# Patient Record
Sex: Male | Born: 1965
Health system: Southern US, Community
[De-identification: ages and names within clinical notes are randomized; demographics above are authoritative.]

## PROBLEM LIST (undated history)

## (undated) DIAGNOSIS — I509 Heart failure, unspecified: Secondary | ICD-10-CM

## (undated) DIAGNOSIS — Z8673 Personal history of transient ischemic attack (TIA), and cerebral infarction without residual deficits: Secondary | ICD-10-CM

## (undated) DIAGNOSIS — E119 Type 2 diabetes mellitus without complications: Secondary | ICD-10-CM

## (undated) DIAGNOSIS — I639 Cerebral infarction, unspecified: Secondary | ICD-10-CM

## (undated) DIAGNOSIS — I10 Essential (primary) hypertension: Secondary | ICD-10-CM

## (undated) HISTORY — PX: EXPLORATORY LAPAROTOMY: SUR591

---

## 2017-07-22 ENCOUNTER — Other Ambulatory Visit: Payer: Self-pay

## 2017-07-22 ENCOUNTER — Emergency Department (HOSPITAL_COMMUNITY): Payer: Medicare Other

## 2017-07-22 ENCOUNTER — Inpatient Hospital Stay (HOSPITAL_COMMUNITY)
Admission: EM | Admit: 2017-07-22 | Discharge: 2017-07-25 | DRG: 291 | Disposition: A | Discharge status: Home Health | Payer: Medicare Other | Attending: Internal Medicine | Admitting: Internal Medicine

## 2017-07-22 ENCOUNTER — Encounter (HOSPITAL_COMMUNITY): Payer: Self-pay

## 2017-07-22 DIAGNOSIS — I5023 Acute on chronic systolic (congestive) heart failure: Secondary | ICD-10-CM | POA: Diagnosis present

## 2017-07-22 DIAGNOSIS — N289 Disorder of kidney and ureter, unspecified: Secondary | ICD-10-CM | POA: Diagnosis not present

## 2017-07-22 DIAGNOSIS — I69354 Hemiplegia and hemiparesis following cerebral infarction affecting left non-dominant side: Secondary | ICD-10-CM | POA: Diagnosis not present

## 2017-07-22 DIAGNOSIS — I10 Essential (primary) hypertension: Secondary | ICD-10-CM | POA: Diagnosis present

## 2017-07-22 DIAGNOSIS — Z7982 Long term (current) use of aspirin: Secondary | ICD-10-CM | POA: Diagnosis not present

## 2017-07-22 DIAGNOSIS — R0603 Acute respiratory distress: Secondary | ICD-10-CM | POA: Diagnosis not present

## 2017-07-22 DIAGNOSIS — L84 Corns and callosities: Secondary | ICD-10-CM | POA: Diagnosis present

## 2017-07-22 DIAGNOSIS — Z794 Long term (current) use of insulin: Secondary | ICD-10-CM | POA: Diagnosis not present

## 2017-07-22 DIAGNOSIS — M25076 Hemarthrosis, unspecified foot: Secondary | ICD-10-CM | POA: Diagnosis not present

## 2017-07-22 DIAGNOSIS — I110 Hypertensive heart disease with heart failure: Principal | ICD-10-CM | POA: Diagnosis present

## 2017-07-22 DIAGNOSIS — F17200 Nicotine dependence, unspecified, uncomplicated: Secondary | ICD-10-CM | POA: Diagnosis not present

## 2017-07-22 DIAGNOSIS — R0902 Hypoxemia: Secondary | ICD-10-CM | POA: Diagnosis not present

## 2017-07-22 DIAGNOSIS — J9601 Acute respiratory failure with hypoxia: Secondary | ICD-10-CM | POA: Diagnosis present

## 2017-07-22 DIAGNOSIS — I5020 Unspecified systolic (congestive) heart failure: Secondary | ICD-10-CM

## 2017-07-22 DIAGNOSIS — Z79899 Other long term (current) drug therapy: Secondary | ICD-10-CM

## 2017-07-22 DIAGNOSIS — J810 Acute pulmonary edema: Secondary | ICD-10-CM

## 2017-07-22 DIAGNOSIS — Z8249 Family history of ischemic heart disease and other diseases of the circulatory system: Secondary | ICD-10-CM | POA: Diagnosis not present

## 2017-07-22 DIAGNOSIS — E119 Type 2 diabetes mellitus without complications: Secondary | ICD-10-CM | POA: Diagnosis not present

## 2017-07-22 DIAGNOSIS — E1140 Type 2 diabetes mellitus with diabetic neuropathy, unspecified: Secondary | ICD-10-CM | POA: Diagnosis present

## 2017-07-22 DIAGNOSIS — R29818 Other symptoms and signs involving the nervous system: Secondary | ICD-10-CM | POA: Diagnosis not present

## 2017-07-22 DIAGNOSIS — R0602 Shortness of breath: Secondary | ICD-10-CM

## 2017-07-22 DIAGNOSIS — K30 Functional dyspepsia: Secondary | ICD-10-CM | POA: Diagnosis present

## 2017-07-22 DIAGNOSIS — E1165 Type 2 diabetes mellitus with hyperglycemia: Secondary | ICD-10-CM | POA: Diagnosis present

## 2017-07-22 DIAGNOSIS — I161 Hypertensive emergency: Secondary | ICD-10-CM | POA: Diagnosis present

## 2017-07-22 DIAGNOSIS — N179 Acute kidney failure, unspecified: Secondary | ICD-10-CM

## 2017-07-22 DIAGNOSIS — R739 Hyperglycemia, unspecified: Secondary | ICD-10-CM

## 2017-07-22 DIAGNOSIS — F17210 Nicotine dependence, cigarettes, uncomplicated: Secondary | ICD-10-CM | POA: Diagnosis present

## 2017-07-22 DIAGNOSIS — I5030 Unspecified diastolic (congestive) heart failure: Secondary | ICD-10-CM | POA: Diagnosis not present

## 2017-07-22 DIAGNOSIS — Z91018 Allergy to other foods: Secondary | ICD-10-CM | POA: Diagnosis not present

## 2017-07-22 DIAGNOSIS — Z832 Family history of diseases of the blood and blood-forming organs and certain disorders involving the immune mechanism: Secondary | ICD-10-CM | POA: Diagnosis not present

## 2017-07-22 DIAGNOSIS — Z8673 Personal history of transient ischemic attack (TIA), and cerebral infarction without residual deficits: Secondary | ICD-10-CM

## 2017-07-22 DIAGNOSIS — R52 Pain, unspecified: Secondary | ICD-10-CM | POA: Diagnosis not present

## 2017-07-22 HISTORY — DX: Heart failure, unspecified: I50.9

## 2017-07-22 HISTORY — DX: Cerebral infarction, unspecified: I63.9

## 2017-07-22 HISTORY — DX: Type 2 diabetes mellitus without complications: E11.9

## 2017-07-22 HISTORY — DX: Personal history of transient ischemic attack (TIA), and cerebral infarction without residual deficits: Z86.73

## 2017-07-22 HISTORY — DX: Essential (primary) hypertension: I10

## 2017-07-22 LAB — COMPREHENSIVE METABOLIC PANEL
ALT: 11 U/L — AB (ref 17–63)
AST: 16 U/L (ref 15–41)
Albumin: 2 g/dL — ABNORMAL LOW (ref 3.5–5.0)
Alkaline Phosphatase: 77 U/L (ref 38–126)
Anion gap: 10 (ref 5–15)
BUN: 25 mg/dL — AB (ref 6–20)
CALCIUM: 8.2 mg/dL — AB (ref 8.9–10.3)
CO2: 25 mmol/L (ref 22–32)
CREATININE: 1.94 mg/dL — AB (ref 0.61–1.24)
Chloride: 103 mmol/L (ref 101–111)
GFR, EST AFRICAN AMERICAN: 44 mL/min — AB (ref >60)
GFR, EST NON AFRICAN AMERICAN: 38 mL/min — AB (ref >60)
Glucose, Bld: 229 mg/dL — ABNORMAL HIGH (ref 65–99)
Potassium: 3.8 mmol/L (ref 3.5–5.1)
SODIUM: 138 mmol/L (ref 135–145)
Total Bilirubin: 0.5 mg/dL (ref 0.3–1.2)
Total Protein: 6 g/dL — ABNORMAL LOW (ref 6.5–8.1)

## 2017-07-22 LAB — I-STAT ARTERIAL BLOOD GAS, ED
ACID-BASE DEFICIT: 5 mmol/L — AB (ref 0.0–2.0)
BICARBONATE: 21.3 mmol/L (ref 20.0–28.0)
O2 Saturation: 95 %
TCO2: 23 mmol/L (ref 22–32)
pCO2 arterial: 45.2 mmHg (ref 32.0–48.0)
pH, Arterial: 7.282 — ABNORMAL LOW (ref 7.350–7.450)
pO2, Arterial: 88 mmHg (ref 83.0–108.0)

## 2017-07-22 LAB — MAGNESIUM: Magnesium: 1.8 mg/dL (ref 1.7–2.4)

## 2017-07-22 LAB — CBC WITH DIFFERENTIAL/PLATELET
Basophils Absolute: 0 10*3/uL (ref 0.0–0.1)
Basophils Relative: 0 %
EOS ABS: 0.1 10*3/uL (ref 0.0–0.7)
EOS PCT: 1 %
HCT: 35.7 % — ABNORMAL LOW (ref 39.0–52.0)
Hemoglobin: 11.1 g/dL — ABNORMAL LOW (ref 13.0–17.0)
LYMPHS ABS: 6 10*3/uL — AB (ref 0.7–4.0)
Lymphocytes Relative: 45 %
MCH: 26.7 pg (ref 26.0–34.0)
MCHC: 31.1 g/dL (ref 30.0–36.0)
MCV: 86 fL (ref 78.0–100.0)
MONO ABS: 0.7 10*3/uL (ref 0.1–1.0)
Monocytes Relative: 5 %
NEUTROS PCT: 49 %
Neutro Abs: 6.5 10*3/uL (ref 1.7–7.7)
PLATELETS: 381 10*3/uL (ref 150–400)
RBC: 4.15 MIL/uL — AB (ref 4.22–5.81)
RDW: 15.7 % — AB (ref 11.5–15.5)
WBC: 13.3 10*3/uL — AB (ref 4.0–10.5)

## 2017-07-22 LAB — I-STAT CHEM 8, ED
BUN: 26 mg/dL — ABNORMAL HIGH (ref 6–20)
CREATININE: 2.1 mg/dL — AB (ref 0.61–1.24)
Calcium, Ion: 1.08 mmol/L — ABNORMAL LOW (ref 1.15–1.40)
Chloride: 104 mmol/L (ref 101–111)
GLUCOSE: 367 mg/dL — AB (ref 65–99)
HEMATOCRIT: 36 % — AB (ref 39.0–52.0)
HEMOGLOBIN: 12.2 g/dL — AB (ref 13.0–17.0)
Potassium: 3.2 mmol/L — ABNORMAL LOW (ref 3.5–5.1)
Sodium: 139 mmol/L (ref 135–145)
TCO2: 23 mmol/L (ref 22–32)

## 2017-07-22 LAB — GLUCOSE, CAPILLARY
Glucose-Capillary: 212 mg/dL — ABNORMAL HIGH (ref 65–99)
Glucose-Capillary: 168 mg/dL — ABNORMAL HIGH (ref 65–99)

## 2017-07-22 LAB — TROPONIN I
TROPONIN I: 0.64 ng/mL — AB (ref <0.03)
Troponin I: 0.74 ng/mL (ref <0.03)

## 2017-07-22 LAB — PROTIME-INR
INR: 1.01
Prothrombin Time: 13.2 seconds (ref 11.4–15.2)

## 2017-07-22 LAB — HEMOGLOBIN A1C
Hgb A1c MFr Bld: 9 % — ABNORMAL HIGH (ref 4.8–5.6)
MEAN PLASMA GLUCOSE: 211.6 mg/dL

## 2017-07-22 LAB — I-STAT TROPONIN, ED: Troponin i, poc: 0.05 ng/mL (ref 0.00–0.08)

## 2017-07-22 LAB — ETHANOL

## 2017-07-22 LAB — BRAIN NATRIURETIC PEPTIDE: B NATRIURETIC PEPTIDE 5: 1614.4 pg/mL — AB (ref 0.0–100.0)

## 2017-07-22 MED ORDER — ATORVASTATIN CALCIUM 40 MG PO TABS
40.0000 mg | ORAL_TABLET | Freq: Every day | ORAL | Status: DC
  Administered 2017-07-22 – 2017-07-24 (×3): 40 mg via ORAL
  Filled 2017-07-22 (×5): qty 1

## 2017-07-22 MED ORDER — NICOTINE 14 MG/24HR TD PT24
14.0000 mg | MEDICATED_PATCH | Freq: Every day | TRANSDERMAL | Status: DC
  Administered 2017-07-22 – 2017-07-25 (×4): 14 mg via TRANSDERMAL
  Filled 2017-07-22 (×4): qty 1

## 2017-07-22 MED ORDER — POTASSIUM CHLORIDE CRYS ER 20 MEQ PO TBCR
20.0000 meq | EXTENDED_RELEASE_TABLET | ORAL | Status: AC
  Administered 2017-07-22 (×2): 20 meq via ORAL
  Filled 2017-07-22 (×2): qty 1

## 2017-07-22 MED ORDER — INSULIN ASPART 100 UNIT/ML ~~LOC~~ SOLN
0.0000 [IU] | Freq: Three times a day (TID) | SUBCUTANEOUS | Status: DC
  Administered 2017-07-22: 5 [IU] via SUBCUTANEOUS
  Administered 2017-07-23: 2 [IU] via SUBCUTANEOUS
  Administered 2017-07-23 – 2017-07-24 (×3): 3 [IU] via SUBCUTANEOUS
  Administered 2017-07-24: 2 [IU] via SUBCUTANEOUS
  Administered 2017-07-25: 3 [IU] via SUBCUTANEOUS

## 2017-07-22 MED ORDER — INSULIN ASPART 100 UNIT/ML ~~LOC~~ SOLN
0.0000 [IU] | Freq: Every day | SUBCUTANEOUS | Status: DC
  Administered 2017-07-23: 2 [IU] via SUBCUTANEOUS

## 2017-07-22 MED ORDER — GABAPENTIN 300 MG PO CAPS
300.0000 mg | ORAL_CAPSULE | Freq: Every day | ORAL | Status: DC
  Administered 2017-07-22 – 2017-07-24 (×3): 300 mg via ORAL
  Filled 2017-07-22 (×3): qty 1

## 2017-07-22 MED ORDER — ACETAMINOPHEN 325 MG PO TABS
650.0000 mg | ORAL_TABLET | Freq: Four times a day (QID) | ORAL | Status: DC | PRN
  Administered 2017-07-22: 650 mg via ORAL
  Filled 2017-07-22: qty 2

## 2017-07-22 MED ORDER — ONDANSETRON HCL 4 MG PO TABS: 4.0000 mg | ORAL_TABLET | Freq: Four times a day (QID) | ORAL | Status: DC | PRN

## 2017-07-22 MED ORDER — POLYETHYLENE GLYCOL 3350 17 G PO PACK: 17.0000 g | PACK | Freq: Every day | ORAL | Status: DC | PRN

## 2017-07-22 MED ORDER — NITROGLYCERIN IN D5W 200-5 MCG/ML-% IV SOLN
0.0000 ug/min | Freq: Once | INTRAVENOUS | Status: AC
  Administered 2017-07-22: 5 ug/min via INTRAVENOUS
  Filled 2017-07-22: qty 250

## 2017-07-22 MED ORDER — SODIUM CHLORIDE 0.9% FLUSH
3.0000 mL | Freq: Two times a day (BID) | INTRAVENOUS | Status: DC
  Administered 2017-07-22 – 2017-07-25 (×5): 3 mL via INTRAVENOUS

## 2017-07-22 MED ORDER — ASPIRIN EC 81 MG PO TBEC
81.0000 mg | DELAYED_RELEASE_TABLET | Freq: Every day | ORAL | Status: DC
  Administered 2017-07-22 – 2017-07-23 (×2): 81 mg via ORAL
  Filled 2017-07-22 (×2): qty 1

## 2017-07-22 MED ORDER — ACETAMINOPHEN 650 MG RE SUPP: 650.0000 mg | Freq: Four times a day (QID) | RECTAL | Status: DC | PRN

## 2017-07-22 MED ORDER — INSULIN GLARGINE 100 UNIT/ML ~~LOC~~ SOLN
20.0000 [IU] | Freq: Every day | SUBCUTANEOUS | Status: DC
  Administered 2017-07-22: 20 [IU] via SUBCUTANEOUS
  Filled 2017-07-22: qty 0.2

## 2017-07-22 MED ORDER — AMLODIPINE BESYLATE 5 MG PO TABS
5.0000 mg | ORAL_TABLET | Freq: Every day | ORAL | Status: DC
  Administered 2017-07-22 – 2017-07-23 (×2): 5 mg via ORAL
  Filled 2017-07-22 (×2): qty 1

## 2017-07-22 MED ORDER — FUROSEMIDE 10 MG/ML IJ SOLN
40.0000 mg | Freq: Every day | INTRAMUSCULAR | Status: DC
  Administered 2017-07-22 – 2017-07-23 (×2): 40 mg via INTRAVENOUS
  Filled 2017-07-22 (×2): qty 4

## 2017-07-22 MED ORDER — ONDANSETRON HCL 4 MG/2ML IJ SOLN: 4.0000 mg | Freq: Four times a day (QID) | INTRAMUSCULAR | Status: DC | PRN

## 2017-07-22 MED ORDER — ENOXAPARIN SODIUM 40 MG/0.4ML ~~LOC~~ SOLN
40.0000 mg | SUBCUTANEOUS | Status: DC
  Administered 2017-07-22 – 2017-07-24 (×3): 40 mg via SUBCUTANEOUS
  Filled 2017-07-22 (×3): qty 0.4

## 2017-07-22 MED ORDER — ACETAMINOPHEN 500 MG PO TABS
1000.0000 mg | ORAL_TABLET | Freq: Once | ORAL | Status: DC
  Filled 2017-07-22: qty 2

## 2017-07-22 NOTE — Progress Notes (Signed)
CRITICAL VALUE ALERT  Critical Value:  Troponin 0.64  Date & Time Notied:  07/22/17 and 8:25 pm  Provider Notified: On call Internal Medicine Resident  Orders Received/Actions taken: No new orders, will continue to monitor.

## 2017-07-22 NOTE — ED Triage Notes (Signed)
Pt to ED via [facility] from home with c/o sudden onset of shortness of breath-- on arrival pt is responsive to speech, has c-pap on per ems on arrival, changed over to BiPap per resp therapy.

## 2017-07-22 NOTE — Progress Notes (Signed)
Pt taken off bipap and placed on 3L [place] at this time. Pt denies SOB, no increased WOB. VS within normal limits. Pt C/O headache at this time. RN aware

## 2017-07-22 NOTE — H&P (Signed)
Date: 07/22/2017               Patient Name:  Tommy Mclaughlin MRN: [id number]  DOB: 08-24-1965 Age / Sex: 52 y.o., male   PCP: Patient, No Pcp Per         Medical Service: Internal Medicine Teaching Service         Attending Physician: Tommy Mclaughlin, Tommy Cons, MD    First Contact: Tommy Mclaughlin MSIV Pager: [phone]  Second Contact: Dr. Lorella Nimrod MD Pager: [phone]       After Hours (After 5p/  First Contact Pager: [phone]  weekends / holidays): Second Contact Pager: [phone]   Chief Complaint: shortness of breath  History of Present Illness: Tommy Mclaughlin is a 69 yoM with a  PMHx of HTN, T2DM, and s/p CVA in 2017 with residual deficit who presents with respiratory distress, crackles and pulmonary edema. In the emergency department, he required BiPAP and was reportedly lethargic on arrival.   He states that he was in his usual state of health until last night when he began experiencing a hacking cough. The cough continued through to this morning at which time he experienced a bout of post-tussive emesis. This morning it was accompanied by what he refers to as "funny" breathing which he elaborated on to mean shortness of breath. It was at this time that he called his cousin who was able to call 911. He reports an episode of chest pain and palpitations this morning when he had his shortness of breath but struggled to describe it. His only complaint at the time of our exam was a headache.  He also experiences pedal edema which he believes has been worsening recently up until his time of presentation. He feels that he has had increased DOE and now has some shortness of breath while completing ADLs, including getting dressed in the morning. Of note, he has been experiencing frequent "indigestion" after every meal for which he takes Zantac. He states that this helps for awhile until it returns after his next meal.  Tommy Mclaughlin is new to the area from [place] as of last Sunday 4/28. He was  there taking care of his mother, who recently passed away. He states that his usual diabetes regimen is Lantus pens, which he prefers due to ease of administration given residual dominant (left) sided weakness. He states that he did bring his insulin with him, but at the time his cousin lacked a refrigerator so the medicine stayed out for two days. Following this, he was unsure if he should use the pens. The only other drug he has tried for his DM is metformin, which gave him GI upset. He states that he makes no effort to lower his blood sugars through diet, and his stroke history makes it hard for him to exercise, though he has begun lifting weights. He previously took a "blue pill" for his high blood pressure which he believes may have been lisinopril, though he has not taken it for about a year. He cites a drug recall as his reason for discontinuing.  He has some residual left sided weakness from his stroke, which is unfortunately his dominant side. He states that he has difficulty writing as well as some gait changes which require him to use a cane or a walker. He also reports intermittent L hip pain which has persisted since the stroke. He does not have any aphasia. He is aware that the stroke was caused by high blood  pressure and now seems to understand the importance of taking his medication regularly.  Meds:  No outpatient medications have been marked as taking for the 07/22/17 encounter Tommy Mclaughlin Encounter).    Allergies: Allergies as of 07/22/2017  . (No Known Allergies)   Past Medical History:  Diagnosis Date  . Hypertension   . Status post CVA   . T2DM (type 2 diabetes mellitus) ([place])     Family History:  Family History  Problem Relation Age of Onset  . COPD Mother   . Hypertension Mother   . Diabetes Mother   . Diabetes Sister   . Diabetes Brother      Social History:  Social History   Tobacco Use  . Smoking status: Current Every Day Smoker    Packs/day: 0.50    Types:  Cigarettes  . Tobacco comment: Quit smoking with patches while in [place], started again when moved to [facility]  Substance Use Topics  . Alcohol use: Not Currently    Comment: stopped after CVA 2017  . Drug use: Not Currently   Social History   Social History Narrative   Mr. Cork moved to [facility] 07/14/17 from [place] where he resided while taking care of his mother. He currently lives with his cousin, who is a Administrator. He has one adult daughter (age 4).    Review of Systems: A complete ROS was negative except as per HPI.  Physical Exam: Blood pressure (!) 178/117, pulse (!) 111, temperature (!) 97.1 F (36.2 C), resp. rate 20, height 6' (1.829 m), weight 200 lb (90.7 kg), SpO2 97 %.   Physical Exam  Constitutional: He is oriented to person, place, and time. He appears well-developed and well-nourished.  HENT:  Head: Normocephalic and atraumatic.  Cardiovascular: Regular rhythm and intact distal pulses. Tachycardia present. Exam reveals gallop, S3 and decreased pulses.  Pulses:      Dorsalis pedis pulses are 1+ on the right side, and 1+ on the left side.       Posterior tibial pulses are 1+ on the right side, and 1+ on the left side.  Pulmonary/Chest: He has decreased breath sounds. He has rales.  Abdominal: Soft. Bowel sounds are normal.  Abdominal scars x2  Musculoskeletal:       Right lower leg: He exhibits edema.       Left lower leg: He exhibits edema.  Bilateral pedal edema.  Feet:  Right Foot:  Protective Sensation: 3 sites tested. 0 sites sensed.  Skin Integrity: Positive for callus and dry skin.  Left Foot:  Protective Sensation: 3 sites tested. 0 sites sensed.  Skin Integrity: Positive for ulcer, skin breakdown (actively bleeding laceration between 4th/5th toe, unknown to patient) and callus.  Neurological: He is alert and oriented to person, place, and time. A sensory deficit (Lacks sensation to touch below the ankles bilaterally) is present.  Decreased  grip strength on the left.  Skin: Skin is dry. Abrasion (to the feet bilaterally) noted. No cyanosis.  Onychomycosis bilaterally to the great toes.  Psychiatric: He has a normal mood and affect. His behavior is normal.  Vitals reviewed.  Lab Results  Component Value Date   WBC 13.3 (H) 07/22/2017   HGB 12.2 (L) 07/22/2017   HCT 36.0 (L) 07/22/2017   PLT 381 07/22/2017   GLUCOSE 367 (H) 07/22/2017   NA 139 07/22/2017   K 3.2 (L) 07/22/2017   CL 104 07/22/2017   CREATININE 2.10 (H) 07/22/2017   BUN 26 (H) 07/22/2017  INR 1.01 07/22/2017   Troponins 0.05 on arrival.  EKG: personally reviewed my interpretation is sinus tachycardia with occasional PVCs, signs of left ventricular hypertrophy. ST depression in  III and aVF. No prior studies for comparison.  CXR: personally reviewed my interpretation is bilateral infiltrates representative of pulmonary edema R>L. No prior studies for comparison.  CT Head: IMPRESSION: 1. No definite acute infarction or area hemorrhage. 2. Cannot exclude a possible area of small vessel disease in the right periventricular white matter of questionable significance  Assessment & Plan by Problem: Active Problems:   Hypertensive emergency   T2DM (type 2 diabetes mellitus) ([place])   Status post CVA  #Hypertension, Volume Overload: Mr. Gardella has a known history of hypertension which has been untreated for approximately the last year. Highest recorded BP was by EMS (188/124) though his pressures remain elevated. Signs of end organ damage with recurrent H/A. Currently he is requiring 3L O2 by nasal cannula. EKG without evidence of acute infarct and negative troponins, lowering our concern for ischemia. Likely new onset CHF with exacerbation by increased blood pressures, other etiologies for his SOB include COPD or pulmonary hypertension which are less likely given his clinical picture. He is diuretic-naive so we will expect a robust response from diuresis.  -  discontinue nitroglycerin gtt - new O2 requirement 3L, continue to wean as able - IV Lasix 40 mg - amlodipine 40mg  qd - Echocardiogram to be performed - ASA 81 mg, nitroglycerin prn - Tylenol prn for pain  #s/p CVA: Mr. Kneeland has residual left sided weakness from his stroke in LUE and LLE, requires mechanical assistance with ambulation at baseline. No residual speech/swallow effects. - Not on aspirin or statin. - cane/walker for ambulation - PT/OT to see  #DM: Blood glucose 324, patient self-discontinued prescribed Lantus. Creatinine 2.10, baseline unknown, CTM. On exam, feet were poorly groomed with actively bleeding lacerations that patient was unable to feel. Greatly diminished touch sensation. It will be vital for patient to establish care with podiatrist to avoid infection. - Diabetic foot care - Gabapentin 300 mg qd - Blood glucose checks q4h - SSI  #Smoking Cessation: Patient had previously quit smoking for one year with use of patches and just restarted one week ago upon moving to [place]. He seems motivated to quit once more. - Nicotine patch 14 mg - Cessation counseling  PPX: lovenox, famotidine  Dispo: Admit patient to Inpatient with expected length of stay greater than 2 midnights.  Signed: Laureen Ochs, Medical Student 07/22/2017, 5:09 PM  Pager: [phone].  Attestation for Student Documentation:  I personally was present and performed or re-performed the history, physical exam and medical decision-making activities of this service and have verified that the service and findings are accurately documented in the student's note.  Lorella Nimrod, MD 07/22/2017, 7:48 PM

## 2017-07-22 NOTE — Progress Notes (Signed)
Pt brought in by EMS on cpap. Pt placed on [place] bipap with settings initially started at 18/8 R 10 50% due to fluid overload. Pt unable to speak at this time and nods to answer questions. Rhonchi/Rales/Coarse Crackles throughout. ABG obtained RRx1, Results hand delivered to MD at bedside

## 2017-07-22 NOTE — ED Provider Notes (Signed)
[facility] EMERGENCY DEPARTMENT Provider Note  CSN: [id number] Arrival date & time: 07/22/17 8527  Chief Complaint(s) Shortness of Breath  HPI Tommy Mclaughlin is a 52 y.o. male with a history of hypertension, IDDM, prior CVA with left-sided deficits presents to the emergency department in respiratory distress.  EMS was called out due to unknown issue from their report.  Apparently the patient called family member who went by to check on the patient and was able to get into the apartment.  The family member was not around when EMS arrived.  When they were able to get into the apartment and assessed the patient they noted that he was in respiratory distress with diffuse crackles, concerning for pulmonary edema.  Patient was placed on BiPAP.  He was noted to be hypertensive.  In route the patient's mental status declined per EMS.  On arrival here the patient is lethargic but responding and answering questions appropriately.  He reports that he has had shortness of breath since yesterday but has been gradually worsening since onset.  He endorses associated chest pain that began this morning.  Unable to assess qualities of chest pain due to patient's lethargy.  He endorses one episode of emesis.  States that he has had lower extremity edema for "a while," which has gradually been worsening.  States that he recently moved from [place] and has not been able to establish care with any primary care provider here yet.  Denies any recent fevers or infections.  No abdominal pain.   HPI  Past Medical History No past medical history on file. There are no active problems to display for this patient.  Home Medication(s) Prior to Admission medications   Not on File                                                                                                                                    Past Surgical History The histories are not reviewed yet. Please review them in the "History"  navigator section and refresh this [place]. Family History No family history on file.  Social History Social History   Tobacco Use  . Smoking status: Not on file  Substance Use Topics  . Alcohol use: Not on file  . Drug use: Not on file   Allergies Patient has no allergy information on record.  Review of Systems Review of Systems All other systems are reviewed and are negative for acute change except as noted in the HPI  Physical Exam Vital Signs  I have reviewed the triage vital signs BP 122/76 (BP Location: Left Arm)   Pulse (!) 111   Temp (!) 97.1 F (36.2 C) (Temporal)   Resp (!) 42   Ht 6' (1.829 m)   Wt 90.7 kg (200 lb)   SpO2 93%   BMI 27.12 kg/m   Physical Exam  Constitutional: He is oriented to person, place, and time. He appears  well-developed and well-nourished. He appears lethargic. He is sleeping and cooperative. He is easily aroused. He has a sickly appearance. No distress.  HENT:  Head: Normocephalic and atraumatic.  Nose: Nose normal.  Eyes: Pupils are equal, round, and reactive to light. Conjunctivae and EOM are normal. Right eye exhibits no discharge. Left eye exhibits no discharge. No scleral icterus.  Neck: Normal range of motion. Neck supple.  Cardiovascular: Normal rate and regular rhythm. Exam reveals no gallop and no friction rub.  No murmur heard. Pulmonary/Chest: Effort normal. No stridor. No respiratory distress. He has rales (diffuse).  Abdominal: Soft. He exhibits no distension. There is no tenderness.  Musculoskeletal: He exhibits no edema or tenderness.  2+ BLE edema to midcalf  Neurological: He is oriented to person, place, and time and easily aroused. He appears lethargic.  Following commands. Noted to have right sided weakness to UE and LE.  Sensation intact.  Skin: Skin is warm and dry. No rash noted. He is not diaphoretic. No erythema.  Psychiatric: He has a normal mood and affect.  Vitals reviewed.   ED Results and  Treatments Labs (all labs ordered are listed, but only abnormal results are displayed) Labs Reviewed  CBC WITH DIFFERENTIAL/PLATELET - Abnormal; Notable for the following components:      Result Value   WBC 13.3 (*)    RBC 4.15 (*)    Hemoglobin 11.1 (*)    HCT 35.7 (*)    RDW 15.7 (*)    Lymphs Abs 6.0 (*)    All other components within normal limits  BRAIN NATRIURETIC PEPTIDE - Abnormal; Notable for the following components:   B Natriuretic Peptide 1,614.4 (*)    All other components within normal limits  I-STAT CHEM 8, ED - Abnormal; Notable for the following components:   Potassium 3.2 (*)    BUN 26 (*)    Creatinine, Ser 2.10 (*)    Glucose, Bld 367 (*)    Calcium, Ion 1.08 (*)    Hemoglobin 12.2 (*)    HCT 36.0 (*)    All other components within normal limits  I-STAT ARTERIAL BLOOD GAS, ED - Abnormal; Notable for the following components:   pH, Arterial 7.282 (*)    Acid-base deficit 5.0 (*)    All other components within normal limits  PROTIME-INR  BLOOD GAS, ARTERIAL  CBC WITH DIFFERENTIAL/PLATELET  ETHANOL  RAPID URINE DRUG SCREEN, HOSP PERFORMED  URINALYSIS, ROUTINE W REFLEX MICROSCOPIC  I-STAT TROPONIN, ED                                                                                                                         EKG  EKG Interpretation  Date/Time:  Monday Jul 22 2017 09:42:41 EDT Ventricular Rate:  113 PR Interval:    QRS Duration: 84 QT Interval:  302 QTC Calculation: 414 R Axis:   68 Text Interpretation:  Sinus tachycardia Ventricular premature complex Probable left atrial enlargement Anteroseptal infarct, old Nonspecific repol abnormality,  diffuse leads NO STEMI No old tracing to compare Confirmed by Addison Lank [phone]) on 07/22/2017 10:50:57 AM      Radiology Ct Head Wo Contrast  Result Date: 07/22/2017 CLINICAL DATA:  Focal neurological deficit of greater than 6 hours EXAM: CT HEAD WITHOUT CONTRAST TECHNIQUE: Contiguous axial images  were obtained from the base of the skull through the vertex without intravenous contrast. COMPARISON:  None. FINDINGS: Brain: The ventricular system is normal in size and configuration and the septum is midline in position. The fourth ventricle and basilar cisterns are unremarkable. Only questionable abnormality is a subtle small area of low-attenuation in the right periventricular white matter which could represent small vessel ischemic change. No hemorrhage is seen. No mass-effect is noted and there is no evidence of acute infarction. Vascular: No vascular abnormality is seen on this unenhanced study. Skull: On bone window images, no calvarial abnormality is noted. Sinuses/Orbits: Paranasal sinuses are well pneumatized. Other: None. IMPRESSION: 1. No definite acute infarction or area hemorrhage. 2. Cannot exclude a possible area of small vessel disease in the right periventricular white matter of questionable significance. Electronically Signed   By: Ivar Drape M.D.   On: 07/22/2017 12:02   Dg Chest Port 1 View  Result Date: 07/22/2017 CLINICAL DATA:  Shortness of breath, on BiPAP EXAM: PORTABLE CHEST 1 VIEW COMPARISON:  Portable exam 2144 hours without priors for comparison FINDINGS: Normal heart size, mediastinal contours and pulmonary vascularity. Perihilar infiltrates bilaterally which could represent pulmonary edema, developing ARDS or infection. No pleural effusion or pneumothorax. Bones unremarkable. IMPRESSION: BILATERAL perihilar infiltrates which could represent pulmonary edema, developing ARDS or infection. Electronically Signed   By: Lavonia Dana M.D.   On: 07/22/2017 10:09   Pertinent labs & imaging results that were available during my care of the patient were reviewed by me and considered in my medical decision making (see chart for details).  Medications Ordered in ED Medications  nitroGLYCERIN 50 mg in dextrose 5 % 250 mL (0.2 mg/mL) infusion (has no administration in time range)    acetaminophen (TYLENOL) tablet 1,000 mg (has no administration in time range)                                                                                                                                    Procedures Procedures CRITICAL CARE Performed by: Grayce Sessions Demarea Lorey Whiters Total critical care time: 65 minutes Critical care time was exclusive of separately billable procedures and treating other patients. Critical care was necessary to treat or prevent imminent or life-threatening deterioration. Critical care was time spent personally by me on the following activities: development of treatment plan with patient and/or surrogate as well as nursing, discussions with consultants, evaluation of patient's response to treatment, examination of patient, obtaining history from patient or surrogate, ordering and performing treatments and interventions, ordering and review of laboratory studies, ordering and review of radiographic studies, pulse oximetry and re-evaluation of  patient's condition.   (including critical care time)  Medical Decision Making / ED Course I have reviewed the nursing notes for this encounter and the patient's prior records (if available in EHR or on provided paperwork).    Respiratory distress in the setting of pulmonary edema.  Given evidence of volume overload concerning for heart failure versus hypertensive emergency .  Patient denies any prior cardiac disease or heart failure.  Denies any recent fevers or infections that would be suspicious for pneumonia.  Patient was admittedly placed on BiPAP.  Patient's mental status noted to gradually improve during BiPAP treatment.  Chest x-ray obtained revealed evidence of diffuse pulmonary edema.  EKG with evidence of LVH but not suspicious for ST segment elevation MI.  Initial troponin negative.  Patient was noted to have right-sided deficits on exam.  Upon further questioning patient adamantly reports left-sided deficits from  prior CVA.  CT head was obtained to evaluate for possible ICH in the setting of likely hypertensive emergency.  CTA was negative for ICH.  Patient's weakness significantly improved with improved respiratory status.  We will able to wean the patient off of the BiPAP and he was placed on 3 L nasal cannula.  Initially the patient's blood pressure was normotensive while on BiPAP and after getting 1 dose of nitroglycerin by EMS.  After BiPAP was weaned, we noted that the patient's blood pressure became elevated.  As revealed evidence of kidney disease.  Uncertain of chronicity.  Patient placed on nitroglycerin drip.  Patient started complaining of headache and was given Tylenol.   We also noted the patient was hyperglycemic without evidence of DKA.   We will discussed the case with medicine for admission for further work-up and management regarding hypertensive emergency and heart failure evaluation.  Final Clinical Impression(s) / ED Diagnoses Final diagnoses:  SOB (shortness of breath)  Hypertensive emergency  Respiratory distress  Acute pulmonary edema (HCC)  Renal insufficiency  Hyperglycemia      This chart was dictated using voice recognition software.  Despite best efforts to proofread,  errors can occur which can change the documentation meaning.   Fatima Blank, MD 07/22/17 [phone]

## 2017-07-23 ENCOUNTER — Inpatient Hospital Stay (HOSPITAL_COMMUNITY): Payer: Medicare Other

## 2017-07-23 DIAGNOSIS — I5030 Unspecified diastolic (congestive) heart failure: Secondary | ICD-10-CM

## 2017-07-23 LAB — CBC
HCT: 31.5 % — ABNORMAL LOW (ref 39.0–52.0)
Hemoglobin: 9.9 g/dL — ABNORMAL LOW (ref 13.0–17.0)
MCH: 26.3 pg (ref 26.0–34.0)
MCHC: 31.4 g/dL (ref 30.0–36.0)
MCV: 83.8 fL (ref 78.0–100.0)
Platelets: 314 K/uL (ref 150–400)
RBC: 3.76 MIL/uL — ABNORMAL LOW (ref 4.22–5.81)
RDW: 15.2 % (ref 11.5–15.5)
WBC: 11.5 K/uL — ABNORMAL HIGH (ref 4.0–10.5)

## 2017-07-23 LAB — ECHOCARDIOGRAM COMPLETE
Height: 72 in
Weight: 3200 [oz_av]

## 2017-07-23 LAB — RETICULOCYTES
RBC.: 3.63 MIL/uL — AB (ref 4.22–5.81)
Retic Count, Absolute: 39.9 10*3/uL (ref 19.0–186.0)
Retic Ct Pct: 1.1 % (ref 0.4–3.1)

## 2017-07-23 LAB — GLUCOSE, CAPILLARY
GLUCOSE-CAPILLARY: 148 mg/dL — AB (ref 65–99)
Glucose-Capillary: 65 mg/dL (ref 65–99)
Glucose-Capillary: 180 mg/dL — ABNORMAL HIGH (ref 65–99)
Glucose-Capillary: 212 mg/dL — ABNORMAL HIGH (ref 65–99)

## 2017-07-23 LAB — BASIC METABOLIC PANEL WITH GFR
Anion gap: 10 (ref 5–15)
BUN: 26 mg/dL — ABNORMAL HIGH (ref 6–20)
CO2: 26 mmol/L (ref 22–32)
Calcium: 8.3 mg/dL — ABNORMAL LOW (ref 8.9–10.3)
Chloride: 105 mmol/L (ref 101–111)
Creatinine, Ser: 2 mg/dL — ABNORMAL HIGH (ref 0.61–1.24)
GFR calc Af Amer: 43 mL/min — ABNORMAL LOW
GFR calc non Af Amer: 37 mL/min — ABNORMAL LOW
Glucose, Bld: 143 mg/dL — ABNORMAL HIGH (ref 65–99)
Potassium: 3.8 mmol/L (ref 3.5–5.1)
Sodium: 141 mmol/L (ref 135–145)

## 2017-07-23 LAB — HIV ANTIBODY (ROUTINE TESTING W REFLEX): HIV Screen 4th Generation wRfx: NONREACTIVE

## 2017-07-23 LAB — TROPONIN I
TROPONIN I: 0.71 ng/mL — AB (ref <0.03)
Troponin I: 0.85 ng/mL

## 2017-07-23 LAB — LACTATE DEHYDROGENASE: LDH: 174 U/L (ref 98–192)

## 2017-07-23 LAB — HEMOGLOBIN AND HEMATOCRIT, BLOOD
HCT: 30.2 % — ABNORMAL LOW (ref 39.0–52.0)
Hemoglobin: 9.5 g/dL — ABNORMAL LOW (ref 13.0–17.0)

## 2017-07-23 MED ORDER — INSULIN GLARGINE 100 UNIT/ML ~~LOC~~ SOLN
15.0000 [IU] | Freq: Every day | SUBCUTANEOUS | Status: DC
  Administered 2017-07-23 – 2017-07-24 (×2): 15 [IU] via SUBCUTANEOUS
  Filled 2017-07-23 (×2): qty 0.15

## 2017-07-23 MED ORDER — LOSARTAN POTASSIUM 50 MG PO TABS
50.0000 mg | ORAL_TABLET | Freq: Every day | ORAL | Status: DC
  Administered 2017-07-23: 50 mg via ORAL
  Filled 2017-07-23: qty 1

## 2017-07-23 MED ORDER — LIVING WELL WITH DIABETES BOOK
Freq: Once | Status: AC
  Administered 2017-07-23: 15:00:00
  Filled 2017-07-23: qty 1

## 2017-07-23 MED ORDER — FAMOTIDINE 20 MG PO TABS
10.0000 mg | ORAL_TABLET | Freq: Every day | ORAL | Status: DC
  Administered 2017-07-23 – 2017-07-25 (×3): 10 mg via ORAL
  Filled 2017-07-23 (×4): qty 1

## 2017-07-23 NOTE — Care Management Note (Signed)
Case Management Note  Patient Details  Name: Tommy Mclaughlin MRN: [id number] Date of Birth: 07-Oct-1965  Subjective/Objective:     Presents with SOB, HTN urgency,hx of HTN, DM, and s/p CVA in 2017.Resides with cousin. Independent with ADL's , no DME usage.  Rosetta Posner (Relative)     [phone]      PCP: NONE  NCM received consult for CHF screen.  Pt without PCP or cardiologist. Pt states mom died of CHF. Pt without CHF education .... NCM to alert nurse of heart failure teaching needed. Pt states has access to obtaining scale for daily weight.  Action/Plan: Transition to home when medically stable.Marland KitchenMarland KitchenNCM following for disposition needs. NCM scheduled appointment with Dr. Enedina Finner [facility] @ [facility] to establish primary care, noted on AVS.    Expected Discharge Date:                  Expected Discharge Plan:  Home/Self Care  In-House Referral:     Discharge planning Services  CM Consult  Post Acute Care Choice:    Choice offered to:     DME Arranged:    DME Agency:     HH Arranged:    HH Agency:     Status of Service:  In process, will continue to follow  If discussed at Long Length of Stay Meetings, dates discussed:    Additional Comments:  Sharin Mons, RN 07/23/2017, 2:49 PM

## 2017-07-23 NOTE — Progress Notes (Addendum)
Inpatient Diabetes Program Recommendations  AACE/ADA: New Consensus Statement on Inpatient Glycemic Control (2015)  Target Ranges:  Prepandial:   less than 140 mg/dL      Peak postprandial:   less than 180 mg/dL (1-2 hours)      Critically ill patients:  140 - 180 mg/dL  Results for TANUJ, MULLENS (MRN [id number]) as of 07/23/2017 11:00  Ref. Range 07/22/2017 18:19 07/22/2017 21:11 07/23/2017 08:03  Glucose-Capillary Latest Ref Range: 65 - 99 mg/dL 212 (H) 168 (H) 65   Results for LEVANTE, SIMONES (MRN [id number]) as of 07/23/2017 11:00  Ref. Range 07/22/2017 17:22 07/23/2017 02:26  Glucose Latest Ref Range: 65 - 99 mg/dL 229 (H) 143 (H)  Hemoglobin A1C Latest Ref Range: 4.8 - 5.6 % 9.0 (H)    Review of Glycemic Control  Diabetes history: DM2 Outpatient Diabetes medications: None listed on home medication list Current orders for Inpatient glycemic control: Lantus 15 units QHS, Novolog 0-15 units TID with meals, Novolog 0-5 units QHS  Inpatient Diabetes Program Recommendations: Insulin - Basal: Fasting glucose 65 mg/dl this morning and noted Lantus was decreased from 20 to 15 units today. HgbA1C: A1C 9.0% on 07/22/17 indicating an average glucose of 212 mg/dl over the past 2-3 months.  NOTE: per H&P on 07/22/17 "Mr. Mi is new to the area from [place] as of last Sunday 4/28. He was there taking care of his mother, who recently passed away. He states that his usual diabetes regimen is Lantus pens, which he prefers due to ease of administration given residual dominant (left) sided weakness. He states that he did bring his insulin with him, but at the time his cousin lacked a refrigerator so the medicine stayed out for two days. Following this, he was unsure if he should use the pens." Will plan to talk with patient today.  Addendum 07/23/17@12 :30-Spoke with patient about diabetes and home regimen for diabetes control. Patient reports that he recently moved to the area from [place] and he had a PCP in  [place] but has not found a new provider in the area yet. Patient states that he was taking Lantus 20 units QAM and Lantus 40 units QHS as an outpatient for diabetes control. Patient reports that he has been prescribed Metformin in the past as well but could not tolerate GI side effects so he stopped taking it.  Patient reports that his PCP had been giving him Lantus pen samples and he had been taking the Lantus for about 6 months. Patient states that when he recently moved from [place] he accidentally left his Lantus insulin pens in the car so he did not take it since it had been in the car for several days and he was unsure if he could still use it or not.  Patient states that he has insulin pen needles and testing supplies at home but he will need a prescription for Lantus insulin pens at time of discharge. Patient states that he does not consistently check his glucose but when he does it is usually in the upper 200's mg/dl and higher (has read HI on meter in past few weeks).  Patient states that he does not recall ever being told about an A1C or his results.  Explained what an A1C is and discussed A1C results (9% on 07/22/17) and explained that his current A1C indicates an average glucose of 212 mg/dl over the past 2-3 months. Discussed glucose and A1C goals. Discussed importance of checking CBGs and maintaining good CBG control to prevent  long-term and short-term complications. Explained how hyperglycemia leads to damage within blood vessels which lead to the common complications seen with uncontrolled diabetes. Stressed to the patient the importance of improving glycemic control to prevent further complications from uncontrolled diabetes. Discussed impact of nutrition, exercise, stress, sickness, and medications on diabetes control. Patient states that he has not been following any specific diet and drinks regular sodas. Discussed carbohydrates, carbohydrate goals per day and meal, along with portion sizes.  Informed patient RD consult would be ordered as well as Living Well with DM book. Encouraged patient to read entire book to gain more knowledge. Patient states that he has been told he will be allowed to follow up at the [facility].  Encouraged patient to check his glucose at least 2 times per day and to take his glucometer with him to doctor appointments. Explained how the doctor he follows up with can use the glycemic trend information on his meter to make adjustments with DM medications. Patient verbalized understanding of information discussed and he states that he has no further questions at this time related to diabetes.  Thanks, Barnie Alderman, RN, MSN, CDE Diabetes Coordinator Inpatient Diabetes Program [phone] (Team Pager from 8am to 5pm)

## 2017-07-23 NOTE — Evaluation (Signed)
Physical Therapy Evaluation Patient Details Name: Tommy Mclaughlin MRN: [id number] DOB: 03-07-1966 Today's Date: 07/23/2017   History of Present Illness  Presents with SOB, HTN urgency,hx of HTN, DM, and s/p CVA in 2017.  Clinical Impression  Pt admitted with above diagnosis. Pt currently with functional limitations due to the deficits listed below (see PT Problem List). Pt required mod A to stand from bed at lowest position. Is very impulsive with mobility, twice putting RW on L foot (did not realize with decreased sensation and his decreased attention) and hit his head on TV in room when not looking. Had to cue him several times to slow down and attend to environment. Noted BLE weakness L>R. Pt unable to explain RLE weakness but ambulates with R hip hike. Has had several falls at home. Would greatly benefit from outpatient neuro PT for balance and strengthening. Would benefit from OT eval as well.  Pt will benefit from skilled PT to increase their independence and safety with mobility to allow discharge to the venue listed below.       Follow Up Recommendations Outpatient PT    Equipment Recommendations  None recommended by PT    Recommendations for Other Services OT consult     Precautions / Restrictions Precautions Precautions: Fall Precaution Comments: pt reports several falls, most recent being 2 days ago Restrictions Weight Bearing Restrictions: No      Mobility  Bed Mobility Overal bed mobility: Modified Independent             General bed mobility comments: pt able to get to EOB independently  Transfers Overall transfer level: Needs assistance Equipment used: Rolling walker (2 wheeled) Transfers: Sit to/from Stand Sit to Stand: Mod assist         General transfer comment: pt tried to stand several times and was unable to achieve full standing and sat back on bed. Then he tried to pull on RW though was stopped by therapist. Educated on safety but pt did not seem  to take this in. Mod A for power up from bed at lowest position  Ambulation/Gait Ambulation/Gait assistance: Min assist Ambulation Distance (Feet): 250 Feet Assistive device: Rolling walker (2 wheeled) Gait Pattern/deviations: Wide base of support;Step-through pattern Gait velocity: decreased Gait velocity interpretation: 1.31 - 2.62 ft/sec, indicative of limited community ambulator General Gait Details: pt with occasional staggering gait. R hip hike and foot slap noted. Mild L knee instability. Discussed elevated fall risk and recommendation to ambulate consistently with RW instead of cane  Stairs            Wheelchair Mobility    Modified Rankin (Stroke Patients Only)       Balance Overall balance assessment: Needs assistance;History of Falls Sitting-balance support: No upper extremity supported Sitting balance-Leahy Scale: Good     Standing balance support: Single extremity supported Standing balance-Leahy Scale: Fair Standing balance comment: able to maintain static stance without support but unable to accept challenge or perform dynamic activity safely                             Pertinent Vitals/Pain Pain Assessment: No/denies pain    Home Living Family/patient expects to be discharged to:: Private residence Living Arrangements: Other relatives Available Help at Discharge: Family;Available PRN/intermittently Type of Home: House Home Access: Stairs to enter Entrance Stairs-Rails: None Entrance Stairs-Number of Steps: 4 Home Layout: One level Home Equipment: Walker - 2 wheels;Cane - single point Additional Comments:  pt moved here from [place] one week ago to live with his cousin    Prior Function Level of Independence: Independent with assistive device(s)         Comments: uses cane most frequently but sometimes RW when he feels weaker than normal L side     Hand Dominance   Dominant Hand: Left    Extremity/Trunk Assessment   Upper  Extremity Assessment Upper Extremity Assessment: LUE deficits/detail LUE Deficits / Details: unable to fully extend fingers, shoulder motion limited LUE Sensation: decreased light touch LUE Coordination: decreased fine motor    Lower Extremity Assessment Lower Extremity Assessment: LLE deficits/detail;RLE deficits/detail;Generalized weakness RLE Deficits / Details: grossly 4-/5. Questioned pt on RLE weakness and he reports that he has some occasional R hip pain but otherwise does not know why RLE is weak. Hikes R hip when ambulating RLE Sensation: WNL RLE Coordination: WNL LLE Deficits / Details: grossly 3+/5, swelling noted L foot>R, decreased sensation noted as pt placed RW on his L foot twice without realizing it LLE Sensation: decreased light touch;decreased proprioception LLE Coordination: decreased gross motor;decreased fine motor    Cervical / Trunk Assessment Cervical / Trunk Assessment: Normal  Communication   Communication: No difficulties  Cognition Arousal/Alertness: Awake/alert Behavior During Therapy: Impulsive Overall Cognitive Status: No family/caregiver present to determine baseline cognitive functioning                                 General Comments: pt veyr impulsive with mobility as demonstrates decreased problem solving ability when facing obstacles in his environment. Unsure if this has been the case since his CVA or if it is his baseline even prior to that.       General Comments General comments (skin integrity, edema, etc.): pt would benefit from OP PT for balance as well as strengthening of LUE and BLE's    Exercises     Assessment/Plan    PT Assessment Patient needs continued PT services  PT Problem List Decreased strength;Decreased range of motion;Decreased activity tolerance;Decreased balance;Decreased mobility;Decreased coordination;Decreased cognition;Decreased knowledge of use of DME;Decreased knowledge of precautions;Decreased  safety awareness;Impaired sensation       PT Treatment Interventions DME instruction;Gait training;Stair training;Functional mobility training;Therapeutic activities;Therapeutic exercise;Balance training;Cognitive remediation;Patient/family education    PT Goals (Current goals can be found in the Care Plan section)  Acute Rehab PT Goals Patient Stated Goal: return home PT Goal Formulation: With patient Time For Goal Achievement: 08/06/17 Potential to Achieve Goals: Good    Frequency Min 3X/week   Barriers to discharge Decreased caregiver support pt reports he is alone most of the day due to cousin working. He does report though that he has other family that can take him to therapy appts    Co-evaluation               AM-PAC PT "6 Clicks" Daily Activity  Outcome Measure Difficulty turning over in bed (including adjusting bedclothes, sheets and blankets)?: None Difficulty moving from lying on back to sitting on the side of the bed? : None Difficulty sitting down on and standing up from a chair with arms (e.g., wheelchair, bedside commode, etc,.)?: Unable Help needed moving to and from a bed to chair (including a wheelchair)?: A Little Help needed walking in hospital room?: A Little Help needed climbing 3-5 steps with a railing? : A Lot 6 Click Score: 17    End of Session Equipment Utilized During Treatment:  Gait belt Activity Tolerance: Patient tolerated treatment well Patient left: in chair;with call bell/phone within reach Nurse Communication: Mobility status PT Visit Diagnosis: Unsteadiness on feet (R26.81);Repeated falls (R29.6);Muscle weakness (generalized) (M62.81);Hemiplegia and hemiparesis Hemiplegia - Right/Left: Left Hemiplegia - dominant/non-dominant: Dominant Hemiplegia - caused by: Cerebral infarction    Time: 0712-1975 PT Time Calculation (min) (ACUTE ONLY): 19 min   Charges:   PT Evaluation $PT Eval Moderate Complexity: 1 Mod     PT G Codes:         Leighton Roach, PT  Acute Rehab Services  [place] 07/23/2017, 4:13 PM

## 2017-07-23 NOTE — Progress Notes (Signed)
  Echocardiogram 2D Echocardiogram has been performed.  Mahima Hottle T Kimani Hovis 07/23/2017, 2:51 PM

## 2017-07-23 NOTE — Progress Notes (Signed)
CRITICAL VALUE ALERT  Critical Value: Troponin  0.85  Date & Time Notied:  07/23/17 0654  Provider Notified: Neva Seat   Orders Received/Actions taken: No new orders received.

## 2017-07-23 NOTE — Progress Notes (Addendum)
Date: 07/23/2017  Patient name: Tommy Mclaughlin  Medical record number: [id number]  Date of birth: 15-Jan-1966   I have seen and evaluated Zenovia Jarred and discussed their care with the Residency Team. Mr Bartow is a 52 yo man with h/o prior CVA with LUE deficits and vague history of an endovascular out of state. Mr Ruark moved rom [place] approximately 1 week ago.  On the night prior to admission, he developed a cough. The following morning, the day of admission, he had dyspnea, chest pain, and palpitations.  His cousin called 51.  In route, he was found to have crackles and rhonchi throughout his pulmonary exam and he was started on CPAP.  When he arrived to the ED, he was transitioned to BiPAP.  He was able to be weaned off BiPAP and transition to nasal cannula which was also eventually stopped. He was transiently on a NTG gtt and also got 40 lasix IV.  This morning, he feels much better and at baseline.  He has not taken any of his diabetic or hypertensive medications for over a week.  He resumed smoking during his move a week ago.  He is not clear on his cardiac history but does remember the facility where he had a cardiac procedure.  PMHx, Fam Hx, and/or Soc Hx : Moved here one week ago from [place]. Had some sort of cardiac procedure yrs back there. Resumed tobacco at time of move.  Vitals:   07/23/17 0747 07/23/17 1258  BP: 137/81 (!) 158/96  Pulse:  (!) 103  Resp:    Temp: 98.6 F (37 C)   SpO2:    T max 99.2 BP today 137/81 - 158/96 HR 103 98% RA HRRR no MRG LCTAB with good air flow ABD + BS, soft Ext improved edema, callus R great toe, several limited abrasions, L great toenail missing  Ct stable at 2 since admission Co2 26 Trop 0.64 - 0.85 - 0.71 HgB  11.1 - 9.9 - 9.5 Plts 314 A1C 9.0 HIV neg  I personally viewed the CXR images and confirmed my reading with the official read. 1 view, PA, interstitial edema  I personally viewed the EKG and confirmed my reading  with the official read. Sinus tachy, nl axis, LAH, LVH, inverted T lateral leads  Assessment and Plan: I have seen and evaluated the patient as outlined above. I agree with the formulated Assessment and Plan as detailed in the residents' note, with the following changes: Mr Franze is a 52 yo with a h/o L sided deficits from a CVA, a vague endovascular cardiac procedure, HTN, and DM.  He presented with a sudden onset*of dyspnea, chest pain, and palpitations.  He was ruled out for an acute coronary event.  PE and CAP were ruled out clinically.  All symptoms and signs were pointing towards volume overload as an etiology.  This is supported by a BNP level of 1600, chest x-ray findings, and exam.  He also responded well to 1 dose of IV Lasix (net - 850 cc).  His echo is pending  It is possible his pulmonary edema was due to a hypertensive emergency.  He has not been taking his antihypertensive reportedly since a recall.  He is new to this area and is willing to come to the internal medicine center for follow-up.  1. Acute hypoxic resp failure - resolved. Likely etiology is HTN urgency if ECHO is nl. BP now acceptable.   2. HTN - Start losartan / HCTZ and F/U  in [facility]. Goal BP as close to 120/80 as possibl2  3. DM II - reportedly on 40 of lantus at home. Would prefer non insulin regimen with meds which provide other benefits like GLP 1 or TZD. Needs podiatry referral for calluses and gen foot care.  4. H/O L sided CVA - ASA and statin  5. Neuropathy - needs education! Better DM control. Trial gaba.  Bartholomew Crews, MD 5/7/20191:46 PM

## 2017-07-23 NOTE — Progress Notes (Signed)
Subjective: Tommy Mclaughlin is doing much better this morning. He states that his breathing is much improved and he is relieved to no longer require O2 nasal cannula. He continues to c/o left hip pain.  He reports continued postprandial indigestion, unchanged from baseline. Notably, he revealed to [place] that he has had a cardiac procedure for a "tear in his heart" which required inguinal access approximately 5 years ago at [facility]. He has signed a consent for [place] to obtain those records.  Objective:  Vital signs in last 24 hours: Vitals:   07/22/17 1730 07/22/17 2037 07/23/17 0512 07/23/17 0747  BP: (!) 154/100 (!) 162/96 94/69 137/81  Pulse: (!) 107 (!) 106 (!) 101   Resp: (!) 28 17 17    Temp:  98.3 F (36.8 C) 99.2 F (37.3 C) 98.6 F (37 C)  TempSrc:  Oral Oral Oral  SpO2: 94% 96% 98%   Weight:      Height:       Lab Results  Component Value Date   WBC 11.5 (H) 07/23/2017   HGB 9.5 (L) 07/23/2017   HCT 30.2 (L) 07/23/2017   PLT 314 07/23/2017   GLUCOSE 143 (H) 07/23/2017   ALT 11 (L) 07/22/2017   AST 16 07/22/2017   NA 141 07/23/2017   K 3.8 07/23/2017   CL 105 07/23/2017   CREATININE 2.00 (H) 07/23/2017   BUN 26 (H) 07/23/2017   CO2 26 07/23/2017   INR 1.01 07/22/2017   HGBA1C 9.0 (H) 07/22/2017   Physical Exam  Constitutional: He is oriented to person, place, and time. He appears well-developed and well-nourished.  HENT:  Head: Normocephalic and atraumatic.  Eyes: EOM are normal.  Neck: Normal range of motion.  Cardiovascular: Regular rhythm and normal heart sounds. Tachycardia present.  No gallop appreciated today  Pulmonary/Chest: Effort normal and breath sounds normal.  Significantly increased air movement from days prior  Abdominal: Soft. Normal appearance and bowel sounds are normal.  Multiple scars appreciated  Musculoskeletal:       Right lower leg: He exhibits edema.       Left lower leg: He exhibits edema.  Feet:  Right Foot:   Skin Integrity: Positive for ulcer, blister, callus and dry skin.  Left Foot:  Skin Integrity: Positive for ulcer, blister, skin breakdown and dry skin.  Neurological: He is alert and oriented to person, place, and time.  Skin: Skin is warm and dry.  Psychiatric: He has a normal mood and affect. His behavior is normal.   Echocardiogram pending.  Lab Results  Component Value Date   TROPONINI 0.85 ([place]) 07/23/2017     Assessment/Plan:  Active Problems:   Hypertensive emergency   T2DM (type 2 diabetes mellitus) ([place])   Status post CVA  #Hypertension, Volume Overload: Tommy Mclaughlin has a known history of hypertension which has been untreated for approximately the last year. Highest recorded BP was by EMS (188/124) though his pressures remain elevated. Signs of end organ damage with recurrent H/A. Initially required BiPAP, now on room air. EKG without evidence of acute infarct and negative troponins on admission. Overnight troponin elevation to 0.6, 0.7 and 0.85. Likely new onset CHF with exacerbation by increased blood pressures, other etiologies for his SOB include COPD or pulmonary hypertension which are less likely given his clinical picture.  - Continue diuresis with 40mg  IV lasix; net negative 0.85L  - O2 weaned, now on RA - Repeat 12h troponin - Discontinue amlodipine; start losartan 50mg  qd for  increased renal protection - Echocardiogram to be performed 5/7 - Nitroglycerin prn - Tylenol prn for pain  #s/p CVA: Tommy Mclaughlin has residual left sided weakness from his stroke in LUE and LLE, requires mechanical assistance with ambulation at baseline. No residual speech/swallow effects. - Initiated ASA 81mg  - cane/walker for ambulation - PT/OT to see  #DM: Blood glucose 324, patient self-discontinued prescribed Lantus. Creatinine 2.10, baseline unknown, CTM. On exam, feet were poorly groomed with actively bleeding lacerations that patient was unable to feel. Greatly diminished touch  sensation. It will be vital for patient to establish care with podiatrist to avoid infection, neuropathy, or even amputation. - Arrange for OP podiatry - Encourage RN foot care before d/c - Gabapentin 300 mg qd - Blood glucose checks q4h - SSI  #Smoking Cessation: Patient had previously quit smoking for one year with use of patches and just restarted one week ago upon moving to [place]. He seems motivated to quit since being hospitalized. - Nicotine patch 14 mg - Cessation counseling  PPX: lovenox, famotidine  Dispo: Anticipated discharge in approximately 1 day(s).   Laureen Ochs, Medical Student 07/23/2017, 11:21 AM Pager: [phone]  Attestation for Student Documentation:  I personally was present and performed or re-performed the history, physical exam and medical decision-making activities of this service and have verified that the service and findings are accurately documented in the student's note.  Jule Ser, DO 07/23/2017, 3:39 PM

## 2017-07-24 ENCOUNTER — Encounter (HOSPITAL_COMMUNITY): Payer: Self-pay

## 2017-07-24 ENCOUNTER — Inpatient Hospital Stay (HOSPITAL_COMMUNITY): Payer: Medicare Other

## 2017-07-24 DIAGNOSIS — M25076 Hemarthrosis, unspecified foot: Secondary | ICD-10-CM

## 2017-07-24 DIAGNOSIS — E119 Type 2 diabetes mellitus without complications: Secondary | ICD-10-CM

## 2017-07-24 DIAGNOSIS — Z832 Family history of diseases of the blood and blood-forming organs and certain disorders involving the immune mechanism: Secondary | ICD-10-CM

## 2017-07-24 DIAGNOSIS — F17200 Nicotine dependence, unspecified, uncomplicated: Secondary | ICD-10-CM

## 2017-07-24 DIAGNOSIS — Z7982 Long term (current) use of aspirin: Secondary | ICD-10-CM

## 2017-07-24 DIAGNOSIS — I69354 Hemiplegia and hemiparesis following cerebral infarction affecting left non-dominant side: Secondary | ICD-10-CM

## 2017-07-24 DIAGNOSIS — I110 Hypertensive heart disease with heart failure: Principal | ICD-10-CM

## 2017-07-24 DIAGNOSIS — Z8249 Family history of ischemic heart disease and other diseases of the circulatory system: Secondary | ICD-10-CM

## 2017-07-24 DIAGNOSIS — I5020 Unspecified systolic (congestive) heart failure: Secondary | ICD-10-CM

## 2017-07-24 LAB — CBC
HCT: 28.1 % — ABNORMAL LOW (ref 39.0–52.0)
Hemoglobin: 8.8 g/dL — ABNORMAL LOW (ref 13.0–17.0)
MCH: 26.3 pg (ref 26.0–34.0)
MCHC: 31.3 g/dL (ref 30.0–36.0)
MCV: 83.9 fL (ref 78.0–100.0)
PLATELETS: 276 10*3/uL (ref 150–400)
RBC: 3.35 MIL/uL — ABNORMAL LOW (ref 4.22–5.81)
RDW: 15.5 % (ref 11.5–15.5)
WBC: 7 10*3/uL (ref 4.0–10.5)

## 2017-07-24 LAB — URINALYSIS, ROUTINE W REFLEX MICROSCOPIC
Bacteria, UA: NONE SEEN
Bilirubin Urine: NEGATIVE
Glucose, UA: 50 mg/dL — AB
KETONES UR: NEGATIVE mg/dL
LEUKOCYTES UA: NEGATIVE
NITRITE: NEGATIVE
Specific Gravity, Urine: 1.012 (ref 1.005–1.030)
pH: 6 (ref 5.0–8.0)

## 2017-07-24 LAB — BASIC METABOLIC PANEL
Anion gap: 9 (ref 5–15)
BUN: 34 mg/dL — AB (ref 6–20)
CO2: 26 mmol/L (ref 22–32)
CREATININE: 2.76 mg/dL — AB (ref 0.61–1.24)
Calcium: 8 mg/dL — ABNORMAL LOW (ref 8.9–10.3)
Chloride: 106 mmol/L (ref 101–111)
GFR calc Af Amer: 29 mL/min — ABNORMAL LOW (ref >60)
GFR, EST NON AFRICAN AMERICAN: 25 mL/min — AB (ref >60)
GLUCOSE: 180 mg/dL — AB (ref 65–99)
Potassium: 3.7 mmol/L (ref 3.5–5.1)
SODIUM: 141 mmol/L (ref 135–145)

## 2017-07-24 LAB — IRON AND TIBC
IRON: 19 ug/dL — AB (ref 45–182)
SATURATION RATIOS: 10 % — AB (ref 17.9–39.5)
TIBC: 189 ug/dL — AB (ref 250–450)
UIBC: 170 ug/dL

## 2017-07-24 LAB — VITAMIN B12: VITAMIN B 12: 276 pg/mL (ref 180–914)

## 2017-07-24 LAB — GLUCOSE, CAPILLARY
Glucose-Capillary: 152 mg/dL — ABNORMAL HIGH (ref 65–99)
Glucose-Capillary: 150 mg/dL — ABNORMAL HIGH (ref 65–99)
Glucose-Capillary: 164 mg/dL — ABNORMAL HIGH (ref 65–99)
Glucose-Capillary: 196 mg/dL — ABNORMAL HIGH (ref 65–99)

## 2017-07-24 LAB — TSH: TSH: 0.698 u[IU]/mL (ref 0.350–4.500)

## 2017-07-24 LAB — FOLATE: FOLATE: 4.8 ng/mL — AB (ref >5.9)

## 2017-07-24 LAB — FERRITIN: Ferritin: 61 ng/mL (ref 24–336)

## 2017-07-24 LAB — PATHOLOGIST SMEAR REVIEW

## 2017-07-24 MED ORDER — METOPROLOL TARTRATE 25 MG PO TABS
25.0000 mg | ORAL_TABLET | Freq: Two times a day (BID) | ORAL | Status: DC
  Administered 2017-07-24: 25 mg via ORAL
  Filled 2017-07-24: qty 1

## 2017-07-24 MED ORDER — BACITRACIN ZINC 500 UNIT/GM EX OINT
TOPICAL_OINTMENT | CUTANEOUS | Status: DC | PRN
  Filled 2017-07-24: qty 28.35

## 2017-07-24 MED ORDER — CARVEDILOL 6.25 MG PO TABS
6.2500 mg | ORAL_TABLET | Freq: Two times a day (BID) | ORAL | Status: DC
  Administered 2017-07-24 – 2017-07-25 (×2): 6.25 mg via ORAL
  Filled 2017-07-24 (×2): qty 1

## 2017-07-24 NOTE — Plan of Care (Signed)
  RD consulted for nutrition education regarding diabetes.   Lab Results  Component Value Date   HGBA1C 9.0 (H) 07/22/2017   Spoke with Tommy Mclaughlin at bedside. He moved here from [facility] 1 week ago. He states he knows what to do but hasn't done it. Continues to drink 2 liters of Pepsi regularly. Encouraged him to switch to [facility] as this would be an easy change and would decrease sugar intake immediately. Discussed plate method and eating more vegetables. He states that his cousin he is living with is a weight lifter, has been encouraging him to eat more salads and less carbs. Seems to have ok support, he just has to be willing to change.  RD provided "Carbohydrate Counting for People with Diabetes" handout from the Academy of Nutrition and Dietetics. Discussed different food groups and their effects on blood sugar, emphasizing carbohydrate-containing foods. Provided list of carbohydrates and recommended serving sizes of common foods.  Discussed importance of controlled and consistent carbohydrate intake throughout the day. Provided examples of ways to balance meals/snacks and encouraged intake of high-fiber, whole grain complex carbohydrates. Teach back method used.  Expect fair compliance.  Body mass index is 31.93 kg/m. Pt meets criteria for obese class I based on current BMI.  Current diet order is heart healthy/carb modified, patient is consuming approximately 100% of meals at this time. Labs and medications reviewed. No further nutrition interventions warranted at this time. RD contact information provided. If additional nutrition issues arise, please re-consult RD.  Tommy Mclaughlin. Tommy Torelli, MS, RD LDN Inpatient Clinical Dietitian Pager [phone]

## 2017-07-24 NOTE — Progress Notes (Signed)
Subjective: Tommy Mclaughlin states that he is doing much better today than days prior. His biggest concern is his residual cough which he states is intermittently painful.  For the first time, he seemed overwhelmed by his hospital course, new diagnosis of CHF and new medicines. His mother had heart failure and he knows they run in a family but he feels as though he has gotten "everything his family has."  Interestingly, he also notes that SCD runs in his family and that multiple uncles died from the disease.  Checking later in the day, Tommy Mclaughlin seemed to be in better spirits and acknowledged the new resources that he was given. He acknowledged some trepidation around traveling to his doctors appointments and physical therapy.  Objective:  Vital signs in last 24 hours: Vitals:   07/23/17 1258 07/23/17 1502 07/23/17 2110 07/24/17 0602  BP: (!) 158/96 (!) 145/93 135/72 (!) 157/92  Pulse: (!) 103 100 (!) 106 (!) 105  Resp:  20 16 20   Temp:  98.5 F (36.9 C) 98.2 F (36.8 C) 97.9 F (36.6 C)  TempSrc:      SpO2:  99% 100% 98%  Weight:    235 lb 7.2 oz (106.8 kg)  Height:       Physical Exam  Constitutional: He is oriented to person, place, and time. He appears well-developed and well-nourished.  HENT:  Head: Normocephalic and atraumatic.  Eyes: Pupils are equal, round, and reactive to light.  Cardiovascular: Regular rhythm. Tachycardia present.  Pulmonary/Chest: He has rales (decreased from prior).  Abdominal: Soft. Bowel sounds are normal.  Musculoskeletal:       Right lower leg: He exhibits edema (decreased from prior).       Left lower leg: He exhibits edema (decreased from prior).  Neurological: He is alert and oriented to person, place, and time. He has normal strength.  L sided grip strength decreased, stable.  Skin: Skin is warm and dry.  LE unchanged  Psychiatric: He has a normal mood and affect. His behavior is normal.     CBC Latest Ref Rng & Units 07/24/2017  07/23/2017 07/23/2017  WBC 4.0 - 10.5 K/uL 7.0 - 11.5(H)  Hemoglobin 13.0 - 17.0 g/dL 8.8(L) 9.5(L) 9.9(L)  Hematocrit 39.0 - 52.0 % 28.1(L) 30.2(L) 31.5(L)  Platelets 150 - 400 K/uL 276 - 314   RecentLabs       Lab Results  Component Value Date   WBC 7.0 07/24/2017   HGB 8.8 (L) 07/24/2017   HCT 28.1 (L) 07/24/2017   PLT 276 07/24/2017   GLUCOSE 180 (H) 07/24/2017   ALT 11 (L) 07/22/2017   AST 16 07/22/2017   NA 141 07/24/2017   K 3.7 07/24/2017   CL 106 07/24/2017   CREATININE 2.76 (H) 07/24/2017   BUN 34 (H) 07/24/2017   CO2 26 07/24/2017   INR 1.01 07/22/2017   HGBA1C 9.0 (H) 07/22/2017     Echocardiogram 07/23/2017: Signs of HFrEF. EF 30-35% with grade I diastolic dysfunction and diffuse hypokinesis. Study Conclusions  - Left ventricle: The cavity size was normal. Wall thickness was   increased in a pattern of mild LVH. Systolic function was   moderately to severely reduced. The estimated ejection fraction   was in the range of 30% to 35%. Diffuse hypokinesis. Doppler   parameters are consistent with abnormal left ventricular   relaxation (grade 1 diastolic dysfunction). Doppler parameters   are consistent with high ventricular filling pressure.  Impressions:  - Moderate to severe global  reduction in LV systolic function; mild   diastolic dysfunction with elevated LV filling pressure; mild   LVH.  Renal Ultrasound 07/24/2017: Normal L and R kidneys, sized 12.9 and 12.2 cm respectively. Left pleural effusion noted.  Assessment/Plan:  Principal Problem:   Hypertensive emergency Active Problems:   T2DM (type 2 diabetes mellitus) ([place])   Status post CVA   HFrEF (heart failure with reduced ejection fraction) (H[facility])  #HFrEF, HTN: Tommy Mclaughlin has a known history of hypertension which has been untreated for approximately the last year. Highest recorded BP was by EMS (188/124) though his pressures remain elevated.Signs of end organ damage with  recurrent H/A. Initially required BiPAP, now on room air.EKG without evidence of acute infarct and negative troponins on admission. Overnight troponin elevation on first night to 0.6, 0.7 and peaked at 0.85.New onset CHF confirmed by echocardiogram 5/7 with flash pulmonary edema the most likely etiology for his dyspnea. Cr increased to 2.76 from assumed baseline of 2.0 after 2 days of diuresis with IV Lasix 40 mg; withheld today. - Lasix d/c'd due to increased Cr -Losartan 50 mg d/c'd due to increased Cr; start Carvedilol 6.25mg  BID - Echocardiogram with signs of CHF; pt counseled -Nitroglycerin prn - Atorvastatin 40 mg - Tylenol prn for pain  #s/p CVA: Tommy Mclaughlin has residual left sided weakness from his stroke in LUE and LLE, requires mechanical assistance with ambulation at baseline. No residual speech/swallow effects. -Initiated ASA 81mg  - cane/walker for ambulation - PT recs: OP PT 3x weekly, OT consulted. Gait training today. Noted "impulsivity" with mobility and decreased problem solving in the setting of obstacles as well as bilateral LE weakness.  #DM:Blood glucose 324, patient self-discontinued prescribed Lantus. Creatinine 2.10, baseline unknown, CTM. On exam, feet were poorly groomed with actively bleeding lacerations that patient was unable to feel. Greatly diminished touch sensation. It will be vital for patient to establish care with podiatrist to avoid infection, neuropathy, or even amputation. -Arrange for OP podiatry - Encourage RN foot care before d/c - Gabapentin 300 mg qd - Blood glucose checks q4h - SSI  #Smoking Cessation:Patient had previously quit smoking for one year with use of patches and just restarted one week ago upon moving to [place]. He seems motivated to quitsince being hospitalized. - Nicotine patch 14 mg - Cessation counseling  PPX: lovenox, famotidine  Dispo: Anticipated discharge in approximately1day(s).   Laureen Ochs, Medical  Student 07/24/2017, 12:55 PM Pager: [phone]  Attestation for Student Documentation:  I personally was present and performed or re-performed the history, physical exam and medical decision-making activities of this service and have verified that the service and findings are accurately documented in the student's note.  Lorella Nimrod, MD 07/24/2017, 7:03 PM

## 2017-07-24 NOTE — Progress Notes (Addendum)
Physical Therapy Treatment Patient Details Name: Tommy Mclaughlin MRN: [id number] DOB: January 13, 1966 Today's Date: 07/24/2017    History of Present Illness Presents with SOB, HTN urgency,hx of HTN, DM, and s/p CVA in 2017.    PT Comments    Patient tolerated session well and overall required supervision/min guard assist for safety with OOB mobility. Pt with HR into 120s with mobility. Pt encouraged to continue using RW vs cane upon d/c due to recurrent falls as reported by pt. Pt has no transportation to Outpatient PT. Updating recommendation to HHPT for further skilled PT services to maximize independence and safety with mobility.    Follow Up Recommendations  HHPT     Equipment Recommendations  None recommended by PT    Recommendations for Other Services OT consult     Precautions / Restrictions Precautions Precautions: Fall Precaution Comments: pt reports frequent falls Restrictions Weight Bearing Restrictions: No    Mobility  Bed Mobility Overal bed mobility: Independent                Transfers Overall transfer level: Needs assistance Equipment used: Rolling walker (2 wheeled);None Transfers: Sit to/from [employer] to Stand: Supervision Stand pivot transfers: Supervision       General transfer comment: supervision for safety; pt able to transfer EOB to recliner without AD  Ambulation/Gait Ambulation/Gait assistance: Min guard Ambulation Distance (Feet): 300 Feet Assistive device: Rolling walker (2 wheeled) Gait Pattern/deviations: Wide base of support;Step-through pattern;Decreased stride length;Decreased dorsiflexion - right;Decreased dorsiflexion - left Gait velocity: decreased   General Gait Details: min guard for safety; slow, grossly steady gait; decreased bilat step length and dorsiflexion   Stairs Stairs: Yes Stairs assistance: Min guard Stair Management: Two rails;Alternating pattern;Forwards Number of Stairs: 5 General  stair comments: cues for sequencing and safety   Wheelchair Mobility    Modified Rankin (Stroke Patients Only)       Balance Overall balance assessment: Needs assistance;History of Falls Sitting-balance support: No upper extremity supported Sitting balance-Leahy Scale: Good     Standing balance support: Single extremity supported Standing balance-Leahy Scale: Fair                              Cognition Arousal/Alertness: Awake/alert Behavior During Therapy: Impulsive Overall Cognitive Status: No family/caregiver present to determine baseline cognitive functioning                                 General Comments: pt impulsive and follows commands inconsistently; difficulty sequencing      Exercises      General Comments        Pertinent Vitals/Pain Pain Assessment: No/denies pain    Home Living                      Prior Function            PT Goals (current goals can now be found in the care plan section) Acute Rehab PT Goals Patient Stated Goal: return home PT Goal Formulation: With patient Time For Goal Achievement: 08/06/17 Potential to Achieve Goals: Good Progress towards PT goals: Progressing toward goals    Frequency    Min 3X/week      PT Plan Current plan remains appropriate    Co-evaluation              AM-PAC PT "6 Clicks" Daily Activity  Outcome Measure  Difficulty turning over in bed (including adjusting bedclothes, sheets and blankets)?: None Difficulty moving from lying on back to sitting on the side of the bed? : None Difficulty sitting down on and standing up from a chair with arms (e.g., wheelchair, bedside commode, etc,.)?: A Lot Help needed moving to and from a bed to chair (including a wheelchair)?: A Little Help needed walking in hospital room?: A Little Help needed climbing 3-5 steps with a railing? : A Little 6 Click Score: 19    End of Session Equipment Utilized During  Treatment: Gait belt Activity Tolerance: Patient tolerated treatment well Patient left: in chair;with call bell/phone within reach Nurse Communication: Mobility status PT Visit Diagnosis: Unsteadiness on feet (R26.81);Repeated falls (R29.6);Muscle weakness (generalized) (M62.81);Hemiplegia and hemiparesis Hemiplegia - Right/Left: Left Hemiplegia - dominant/non-dominant: Dominant Hemiplegia - caused by: Cerebral infarction     Time: 8757-9728 PT Time Calculation (min) (ACUTE ONLY): 18 min  Charges:  $Gait Training: 8-22 mins                    G Codes:       Earney Navy, PTA Pager: [phone]     Darliss Cheney 07/24/2017, 9:23 AM

## 2017-07-24 NOTE — Progress Notes (Signed)
  Date: 07/24/2017  Patient name: Crue Otero  Medical record number: [id number]  Date of birth: 11/24/1965   I have seen and evaluated this patient and I have discussed the plan of care with the house staff. Please see their note for complete details. I concur with their findings with the following additions/corrections: Mr Rhinehart was seen on AM rounds. I/O recorded as -2.8 L, weight is inaccurate. Cr bumped from 2 to 2.7 although CO2 unchanged. ARB had also been started yesterday. He has no symptoms of vol contracttion. Renal U/S shows no post renal obstruction. Nor does it show medical renal dz. I think the most likely cause is too much lasix despite no sxs of vol contraction. Holding ACE and lasix and recheck Cr in AM.   Once stable for D/C, work on transportation assistance, cardiac rehab consult?, DM education, medicaid in [place], [facility] F/U.  Bartholomew Crews, MD 07/24/2017, 2:08 PM

## 2017-07-25 ENCOUNTER — Telehealth: Payer: Self-pay | Admitting: General Practice

## 2017-07-25 DIAGNOSIS — Z91018 Allergy to other foods: Secondary | ICD-10-CM

## 2017-07-25 DIAGNOSIS — Z794 Long term (current) use of insulin: Secondary | ICD-10-CM

## 2017-07-25 DIAGNOSIS — Z79899 Other long term (current) drug therapy: Secondary | ICD-10-CM

## 2017-07-25 DIAGNOSIS — I161 Hypertensive emergency: Secondary | ICD-10-CM

## 2017-07-25 LAB — GLUCOSE, CAPILLARY
Glucose-Capillary: 106 mg/dL — ABNORMAL HIGH (ref 65–99)
Glucose-Capillary: 185 mg/dL — ABNORMAL HIGH (ref 65–99)

## 2017-07-25 LAB — BASIC METABOLIC PANEL
ANION GAP: 6 (ref 5–15)
BUN: 30 mg/dL — AB (ref 6–20)
CHLORIDE: 108 mmol/L (ref 101–111)
CO2: 26 mmol/L (ref 22–32)
Calcium: 8.2 mg/dL — ABNORMAL LOW (ref 8.9–10.3)
Creatinine, Ser: 2.1 mg/dL — ABNORMAL HIGH (ref 0.61–1.24)
GFR calc Af Amer: 40 mL/min — ABNORMAL LOW (ref >60)
GFR calc non Af Amer: 35 mL/min — ABNORMAL LOW (ref >60)
GLUCOSE: 130 mg/dL — AB (ref 65–99)
POTASSIUM: 4.1 mmol/L (ref 3.5–5.1)
Sodium: 140 mmol/L (ref 135–145)

## 2017-07-25 LAB — CBC
HEMATOCRIT: 29.1 % — AB (ref 39.0–52.0)
HEMOGLOBIN: 9 g/dL — AB (ref 13.0–17.0)
MCH: 25.7 pg — ABNORMAL LOW (ref 26.0–34.0)
MCHC: 30.9 g/dL (ref 30.0–36.0)
MCV: 83.1 fL (ref 78.0–100.0)
Platelets: 329 10*3/uL (ref 150–400)
RBC: 3.5 MIL/uL — ABNORMAL LOW (ref 4.22–5.81)
RDW: 15.4 % (ref 11.5–15.5)
WBC: 8 10*3/uL (ref 4.0–10.5)

## 2017-07-25 MED ORDER — LIRAGLUTIDE 18 MG/3ML ~~LOC~~ SOPN: 1.2000 mg | PEN_INJECTOR | Freq: Every day | SUBCUTANEOUS | 0 refills | Status: DC

## 2017-07-25 MED ORDER — ATORVASTATIN CALCIUM 40 MG PO TABS: 40.0000 mg | ORAL_TABLET | Freq: Every day | ORAL | 2 refills | Status: AC

## 2017-07-25 MED ORDER — BACITRACIN ZINC 500 UNIT/GM EX OINT: TOPICAL_OINTMENT | CUTANEOUS | 0 refills | Status: AC | PRN

## 2017-07-25 MED ORDER — NICOTINE 14 MG/24HR TD PT24: 14.0000 mg | MEDICATED_PATCH | Freq: Every day | TRANSDERMAL | 0 refills | Status: AC

## 2017-07-25 MED ORDER — GABAPENTIN 300 MG PO CAPS: 300.0000 mg | ORAL_CAPSULE | Freq: Every day | ORAL | 0 refills | Status: DC

## 2017-07-25 MED ORDER — FAMOTIDINE 10 MG PO TABS: 10.0000 mg | ORAL_TABLET | Freq: Every day | ORAL | 0 refills | Status: AC

## 2017-07-25 MED ORDER — FUROSEMIDE 20 MG PO TABS: 20.0000 mg | ORAL_TABLET | Freq: Every day | ORAL | 11 refills | Status: DC

## 2017-07-25 MED ORDER — CARVEDILOL 6.25 MG PO TABS: 6.2500 mg | ORAL_TABLET | Freq: Two times a day (BID) | ORAL | 0 refills | Status: DC

## 2017-07-25 MED ORDER — LOSARTAN POTASSIUM 25 MG PO TABS: 25.0000 mg | ORAL_TABLET | Freq: Every day | ORAL | 0 refills | Status: DC

## 2017-07-25 NOTE — Discharge Summary (Signed)
Name: Tommy Mclaughlin MRN: [id number] DOB: 04-Nov-1965 52 y.o. PCP: Patient, No Pcp Per  Date of Admission: 07/22/2017  9:42 AM Date of Discharge: 07/25/2017 Attending Physician: No att. providers found  Discharge Diagnosis: 1. HFrEF, acute exacerbation 2. Hypertensive Emergency  Active Problems:   T2DM (type 2 diabetes mellitus) ([place])   Status post CVA   Hypertension   HFrEF (heart failure with reduced ejection fraction) ([place])   Discharge Medications: Allergies as of 07/25/2017      Reactions   Tomato Rash      Medication List    TAKE these medications   atorvastatin 40 MG tablet Commonly known as:  LIPITOR Take 1 tablet (40 mg total) by mouth daily at 6 PM.   bacitracin ointment Apply topically as needed for wound care.   carvedilol 6.25 MG tablet Commonly known as:  COREG Take 1 tablet (6.25 mg total) by mouth 2 (two) times daily with a meal.   famotidine 10 MG tablet Commonly known as:  PEPCID Take 1 tablet (10 mg total) by mouth daily. Start taking on:  07/26/2017   furosemide 20 MG tablet Commonly known as:  LASIX Take 1 tablet (20 mg total) by mouth daily.   gabapentin 300 MG capsule Commonly known as:  NEURONTIN Take 1 capsule (300 mg total) by mouth at bedtime.   liraglutide 18 MG/3ML Sopn Commonly known as:  VICTOZA Inject 0.2 mLs (1.2 mg total) into the skin daily. Start with 0.6 mg daily for 1 week then increase your dose to 1.2 mg daily.   losartan 25 MG tablet Commonly known as:  COZAAR Take 1 tablet (25 mg total) by mouth daily.   nicotine 14 mg/24hr patch Commonly known as:  NICODERM CQ - dosed in mg/24 hours Place 1 patch (14 mg total) onto the skin daily. Start taking on:  07/26/2017       Disposition and follow-up:   Tommy Mclaughlin was discharged from [facility] in Good condition.  At the hospital follow up visit please address:  1.  Please review: A. Diabetic Glucose Control (on Victoza only at discharge, has  glucometer) B. Fluid status, daily weights  C. Microscopic hemoglobinuria (repeat UA) D. Iron Deficiency Anemia (OTC supplementation) E. Blood pressure control, now on losartan 25mg  (assess for AKI) F. Candidacy for C/V rehab G. Outpatient PT/OT, possibly via [place] Maintenance-- Colonoscopy  2.  Labs / imaging needed at time of follow-up: see above  3.  Pending labs/ test needing follow-up: none  Follow-up Appointments: Follow-up Information    [facility] at [facility]. Go on 09/16/2017.   Why:  Appointment with Dr.Husain on 09/16/2017 at 3:30 pm to establish primary care Contact information:   [facility] [employer] [address] [place], [place] [address]   [place] Follow up.   Contact information: [address] [place] [address] [phone]        [place]. Go on 07/30/2017.   Why:  At 10:45 AM Contact information: [address] [place] Hospital Course by problem list: Active Problems:   T2DM (type 2 diabetes mellitus) ([place])   Status post CVA   Hypertension   HFrEF (heart failure with reduced ejection fraction) [facility])  Tommy Mclaughlin is a 52 yoM newly relocated from [place] with  a PMHx of HTN, T2DM, and s/p CVA in 2017 with residual deficit who presented in respiratory distress with hypertensive emergency on 07/22/2017. He was found to have CHF and diuresed pharmacologically before being discharged on maintenance medications on 07/25/2017.  #Hypertension, CHF: Tommy Mclaughlin has a known history of hypertension which has been untreated for approximately the last year. Highest recorded BP was by EMS (188/124) though his pressures remained elevated throughout his admission. He initially required BiPAP, then 3L O2 by nasal cannula before weaning to room air. EKG remained without evidence  of acute infarct. Troponins were initially negative but eventually peaked at 0.85. Echocardiogram revealed an EF of 30-35% with mild LVH and diffuse hypokinesis, thus a diagnosis of new onset CHF was made. He was aggressively diuresed with IV Lasix 40 mg BID and placed on losartan 50mg  which led to an increase in his creatinine from his assumed baseline of 2.1 to 2.67. Renal ultrasound was negative for intrarenal process. Diuresis and losartan were discontinued and creatinine returned to baseline. He was discharged on lasix 20 mg po daily and losartan 25 mg. He will also be discharged on prophylactic ASA 81mg . He has stated that he will have access to a scale and knows to take and report changes in his daily weights.  #s/p CVA: Tommy Mclaughlin has residual left sided weakness from his stroke in LUE and LLE, requiring mechanical assistance with cane or rollator at baseline. He has no residual speech/swallow effects. Physical and occupational therapy evaluation revealed that Tommy Mclaughlin may have poor insight into his limitations, noting that his actions seemed somewhat impulsive. Outpatient PT and OT home health were recommended at discharge.  #DM: Tommy Mclaughlin blood glucose 324 on admission as he had self-discontinued prescribed Lantus. On exam, feet were poorly groomed with actively bleeding lacerations that patient was unable to feel. It will be vital for patient to establish care with podiatrist to avoid infection, as his touch sensation is greatly diminished. He will begin gabapentin 300mg  qd. Although he is used to taking insulin at home he has not yet tried any other diabetic agents outside of metformin. He will be discharged on Victoza due to cardiorenal benefits but continue to check his blood sugars. In clinic, need for continuing insulin will be assessed.  #Smoking Cessation: Patient had previously quit smoking for one year with use of patches and just restarted one week ago upon moving to [place].  He seems motivated to quit once more with nicotine patches.   Discharge Vitals:   BP (!) 143/82   Pulse 91   Temp 98.4 F (36.9 C) (Oral)   Resp 17   Ht 6' (1.829 m)   Wt 235 lb 7.2 oz (106.8 kg)   SpO2 100%   BMI 31.93 kg/m   Pertinent Labs, Studies, and Procedures:  Admission CXR IMPRESSION: BILATERAL perihilar infiltrates which could represent pulmonary edema, developing ARDS or infection.  Renal US IMPRESSION: 1. Unremarkable kidneys.  No evidence of hydronephrosis. 2. Small LEFT pleural effusion.  Echocardiogram Study Conclusions  - Left ventricle: The cavity size was normal. Wall thickness was   increased in a pattern of mild LVH. Systolic function was   moderately to severely reduced. The estimated ejection fraction   was in the range of 30% to 35%. Diffuse hypokinesis. Doppler   parameters are consistent with abnormal left ventricular   relaxation (grade 1 diastolic dysfunction). Doppler parameters   are consistent with high ventricular filling pressure.  Impressions:  - Moderate to severe  global reduction in LV systolic function; mild   diastolic dysfunction with elevated LV filling pressure; mild   LVH.  Discharge Instructions: Discharge Instructions    Avoid straining   Complete by:  As directed    Diet - low sodium heart healthy   Complete by:  As directed    Diet - low sodium heart healthy   Complete by:  As directed    Discharge instructions   Complete by:  As directed    It was pleasure taking care of you. As we discussed please take all of your medications as directed. Weigh yourself daily and if you weight is up 3 pounds in 1 day or or 5 pound in 1 week-take an extra pill of Lasix and call our clinic to talk with someone if you think we need further assistance. You will stop taking insulin at this time and start this new medicine called Victoza or liraglutide, please keep checking your blood sugar every day and bring your glucometer to your  appointment in clinic. As we discussed most common side effect is nausea and it normally goes away within 1 to 2 weeks-if it it becomes unbearable please let us know. Your appointment is scheduled for upcoming Tuesday, Jul 30, 2017 at 10:45 AM.   Heart Failure patients record your daily weight using the same scale at the same time of day   Complete by:  As directed    Increase activity slowly   Complete by:  As directed    Increase activity slowly   Complete by:  As directed    STOP any activity that causes chest pain, shortness of breath, dizziness, sweating, or exessive weakness   Complete by:  As directed       Signed: Laureen Ochs, Medical Student 07/25/2017, 5:11 PM   Pager: [phone]  Attestation for Student Documentation:  I personally was present and performed or re-performed the history, physical exam and medical decision-making activities of this service and have verified that the service and findings are accurately documented in the student's note.  Lorella Nimrod, MD 07/26/2017, 12:53 PM

## 2017-07-25 NOTE — Progress Notes (Signed)
Pt stated that IV line was removed while he was asleep. Pt refused to have new IV.

## 2017-07-25 NOTE — Evaluation (Signed)
Occupational Therapy Evaluation and Discharge Patient Details Name: Tommy Mclaughlin MRN: [id number] DOB: 1965-05-30 Today's Date: 07/25/2017    History of Present Illness Presents with SOB, HTN urgency,hx of HTN, DM, and s/p CVA in 2017.   Clinical Impression   Pt likely functioning very close to baseline, but demonstrates impulsivity and has a recent hx of falls. Recommending HHOT to address home safety, IADL and possible tub equipment. No further acute OT needs.    Follow Up Recommendations  Home health OT    Equipment Recommendations  (defer to [facility])    Recommendations for Other Services       Precautions / Restrictions Precautions Precautions: Fall Precaution Comments: pt reports several falls, most recent being 2 days ago Restrictions Weight Bearing Restrictions: No      Mobility Bed Mobility Overal bed mobility: Independent             General bed mobility comments: pt in chair  Transfers Overall transfer level: Modified independent Equipment used: Rolling walker (2 wheeled) Transfers: Sit to/from Stand Sit to Stand: Modified independent (Device/Increase time)         General transfer comment: steady    Balance Overall balance assessment: Needs assistance;History of Falls Sitting-balance support: No upper extremity supported Sitting balance-Leahy Scale: Good     Standing balance support: Single extremity supported Standing balance-Leahy Scale: Fair Standing balance comment: able to maintain static stance without support but unable to accept challenge or perform dynamic activity safely                           ADL either performed or assessed with clinical judgement   ADL Overall ADL's : Needs assistance/impaired Eating/Feeding: Independent;Sitting   Grooming: Wash/dry hands;Wash/dry face;Standing;Supervision/safety   Upper Body Bathing: Set up;Sitting   Lower Body Bathing: Sit to/from stand;Set up   Upper Body Dressing : Set  up;Sitting   Lower Body Dressing: Sit to/from stand;Set up   Toilet Transfer: Supervision/safety;Ambulation;RW   Toileting- Clothing Manipulation and Hygiene: Sit to/from stand;Modified independent               Vision Patient Visual Report: No change from baseline       Perception     Praxis      Pertinent Vitals/Pain Pain Assessment: No/denies pain     Hand Dominance Left   Extremity/Trunk Assessment Upper Extremity Assessment Upper Extremity Assessment: LUE deficits/detail LUE Deficits / Details: unable to fully extend fingers, shoulder motion limited, longstanding from previous CVA LUE Sensation: decreased light touch LUE Coordination: decreased fine motor;decreased gross motor   Lower Extremity Assessment Lower Extremity Assessment: Defer to PT evaluation   Cervical / Trunk Assessment Cervical / Trunk Assessment: Normal   Communication Communication Communication: No difficulties   Cognition Arousal/Alertness: Awake/alert Behavior During Therapy: WFL for tasks assessed/performed Overall Cognitive Status: Within Functional Limits for tasks assessed                                 General Comments: pt with some impulsivity   General Comments       Exercises    Shoulder Instructions      Home Living Family/patient expects to be discharged to:: Private residence Living Arrangements: Other relatives(cousin) Available Help at Discharge: Family;Available PRN/intermittently Type of Home: House Home Access: Stairs to enter [employer] of Steps: 4 Entrance Stairs-Rails: None Home Layout: One level  Bathroom Shower/Tub: Teacher, early years/pre: Standard     Home Equipment: Environmental consultant - 2 wheels;Cane - single point   Additional Comments: pt moved here from [place] one week ago to live with his cousin      Prior Functioning/Environment Level of Independence: Independent with assistive device(s)        Comments:  uses cane most frequently but sometimes RW when he feels weaker than normal L side        OT Problem List: Impaired balance (sitting and/or standing)      OT Treatment/Interventions:      OT Goals(Current goals can be found in the care plan section) Acute Rehab OT Goals Patient Stated Goal: return home  OT Frequency:     Barriers to D/C:            Co-evaluation              AM-PAC PT "6 Clicks" Daily Activity     Outcome Measure Help from another person eating meals?: None Help from another person taking care of personal grooming?: A Little Help from another person toileting, which includes using toliet, bedpan, or urinal?: A Little Help from another person bathing (including washing, rinsing, drying)?: A Little Help from another person to put on and taking off regular upper body clothing?: None Help from another person to put on and taking off regular lower body clothing?: None 6 Click Score: 21   End of Session Equipment Utilized During Treatment: Gait belt  Activity Tolerance: Patient tolerated treatment well Patient left: in chair;with call bell/phone within reach  OT Visit Diagnosis: Other abnormalities of gait and mobility (R26.89)                Time: 1638-4536 OT Time Calculation (min): 17 min Charges:  OT General Charges $OT Visit: 1 Visit OT Evaluation $OT Eval Low Complexity: 1 Low G-Codes:     2017/08/12 Nestor Lewandowsky, OTR/L Pager: Tommy Mclaughlin, Tommy Mclaughlin 12-Aug-2017, 1:34 PM

## 2017-07-25 NOTE — Progress Notes (Signed)
  Date: 07/25/2017  Patient name: Tommy Mclaughlin  Medical record number: [id number]  Date of birth: [birth year]   I have seen and evaluated this patient and I have discussed the plan of care with the house staff. Please see their note for complete details. I concur with their findings with the following additions/corrections: Tommy Mclaughlin was seen on AM rounds.  1. Home health for  PT  OT  Education  Tele health for electronic scales 2. [facility] F/U 5/14  Dm mgmt  HF check  Colon cancer screening  DM education  Referral to cardiac rehab  DM eye exam  Transportation assistance  Podiatry referral  DM shoes  Bartholomew Crews, MD 07/25/2017, 5:16 PM

## 2017-07-25 NOTE — Care Management Note (Signed)
Case Management Note  Patient Details  Name: Jones Viviani MRN: [id number] Date of Birth: [birth year]  Subjective/Objective:                    Action/Plan:  Spoke with Dr Reesa Chew, patient will follow up in Internal Medicine Clinic, has appointment for Tuesday Jul 30, 2017 at 10 am, PCP Dr Lynnae January  Expected Discharge Date:  07/25/17               Expected Discharge Plan:  [place]  In-House Referral:     Discharge planning Services  CM Consult  Post Acute Care Choice:  Home Health Choice offered to:  Patient  DME Arranged:  N/A DME Agency:  NA  HH Arranged:  RN, PT, OT, Disease Management [place] Agency:  [place]  Status of Service:  Completed, signed off  If discussed at [place] of Stay Meetings, dates discussed:    Additional Comments:  Marilu Favre, RN 07/25/2017, 12:30 PM

## 2017-07-25 NOTE — Progress Notes (Signed)
Subjective: Tommy Mclaughlin appears well rested and ready to go home this AM. He was able to rearticulate his instructions regarding medication, weighing himself and returning to clinic.  Going forward, he understands to limit his salt and sugar intake.  Objective:  Vital signs in last 24 hours: Vitals:   07/24/17 1643 07/24/17 2215 07/25/17 0500 07/25/17 1143  BP: (!) 157/96 (!) 167/99 101/64 (!) 143/82  Pulse: 87 98 91   Resp: 18 18 17    Temp: (!) 97.3 F (36.3 C) 98.3 F (36.8 C) 98.4 F (36.9 C)   TempSrc:  Oral Oral   SpO2: 100% 97% 100%   Weight:      Height:       Physical Exam  Constitutional: He is oriented to person, place, and time. He appears well-developed and well-nourished.  HENT:  Head: Normocephalic and atraumatic.  Eyes: Pupils are equal, round, and reactive to light.  Cardiovascular: Normal rate and regular rhythm.  Pulmonary/Chest: Effort normal. He has rales in the left lower field.  Abdominal: Soft. Bowel sounds are normal.  Musculoskeletal:  Baseline left sided weakness.  Neurological: He is alert and oriented to person, place, and time.  Skin: Skin is warm and dry.  Psychiatric: He has a normal mood and affect. His behavior is normal.     Lab Results  Component Value Date   WBC 8.0 07/25/2017   HGB 9.0 (L) 07/25/2017   HCT 29.1 (L) 07/25/2017   PLT 329 07/25/2017   GLUCOSE 130 (H) 07/25/2017   ALT 11 (L) 07/22/2017   AST 16 07/22/2017   NA 140 07/25/2017   K 4.1 07/25/2017   CL 108 07/25/2017   CREATININE 2.10 (H) 07/25/2017   BUN 30 (H) 07/25/2017   CO2 26 07/25/2017   TSH 0.698 07/24/2017   INR 1.01 07/22/2017   HGBA1C 9.0 (H) 07/22/2017   Urinalysis    Component Value Date/Time   COLORURINE STRAW (A) 07/23/2017 1418   APPEARANCEUR CLEAR 07/23/2017 1418   LABSPEC 1.012 07/23/2017 1418   PHURINE 6.0 07/23/2017 1418   GLUCOSEU 50 (A) 07/23/2017 1418   HGBUR SMALL (A) 07/23/2017 1418   BILIRUBINUR NEGATIVE 07/23/2017 1418   KETONESUR NEGATIVE 07/23/2017 1418   PROTEINUR >=300 (A) 07/23/2017 1418   NITRITE NEGATIVE 07/23/2017 1418   LEUKOCYTESUR NEGATIVE 07/23/2017 1418    . Assessment/Plan:  Principal Problem:   Hypertensive emergency Active Problems:   T2DM (type 2 diabetes mellitus) ([place])   Status post CVA   HFrEF (heart failure with reduced ejection fraction) (HCC)  #HFrEF, HTN: Mr. Tommy Mclaughlin has a known history of hypertension which has been untreated for approximately the last year. Highest recorded BP was by EMS (188/124) though his pressures remain elevated.Signs of end organ damage with recurrent H/A. Initially required BiPAP, now on room air.EKG without evidence of acute infarct and negative troponins on admission. Overnight troponin elevationon first nightto 0.6, 0.7 and peaked at0.85.New onset CHFconfirmed by echocardiogram 5/7 with flash pulmonary edema the most likely etiology for his dyspnea.  - Lasix d/c'd d/t increased Cr, now corrected to 2.10  -Losartan 25mg  restarted - Echocardiogram with signs of CHF; pt counseled -Nitroglycerin prn - Atorvastatin 40 mg - Tylenol prn for pain  #s/p CVA: Mr. Tommy Mclaughlin has residual left sided weakness from his stroke in LUE and LLE, requires mechanical assistance with ambulation at baseline. No residual speech/swallow effects. -Initiated ASA 81mg  - cane/walker for ambulation - PT recs: OP PT 3x weekly, OT recommending HH. Gait training today.  Noted "impulsivity" with mobility and decreased problem solving in the setting of obstacles.  #DM:Blood glucose 324, patient self-discontinued prescribed Lantus. Creatinine 2.10, CTM. On exam, feet were poorly groomed with actively bleeding lacerations that patient was unable to feel. Greatly diminished touch sensation. It will be vital for patient to establish care with podiatrist to avoid infection, neuropathy, or even amputation. -Arrange for OP podiatry - RN foot care ordered - Gabapentin 300 mg qd -  Blood glucose checks q4h - SSI, home with Victoza  #Smoking Cessation:Patient had previously quit smoking for one year with use of patches and just restarted one week ago upon moving to [place]. He seems motivated to quitsince being hospitalized. - Nicotine patch 14 mg - Cessation counseling  PPX: lovenox, famotidine   Dispo: Anticipated discharge in approximately 0 day(s).   Laureen Ochs, Medical Student 07/25/2017, 2:50 PM Pager: [phone]  Attestation for Student Documentation:  I personally was present and performed or re-performed the history, physical exam and medical decision-making activities of this service and have verified that the service and findings are accurately documented in the student's note.  Lorella Nimrod, MD 07/28/2017, 12:51 AM

## 2017-07-25 NOTE — Telephone Encounter (Signed)
Hospital f/u est care per Dr Amin/ pt appt 05/07 1045a/NW

## 2017-07-25 NOTE — Progress Notes (Signed)
Physical Therapy Treatment Patient Details Name: Devine Klingel MRN: [id number] DOB: [birth year] Today's Date: 07/25/2017    History of Present Illness Presents with SOB, HTN urgency,hx of HTN, DM, and s/p CVA in 2017.    PT Comments    Pt ambulated 300' with RW with no loss of balance, distance limited by fatigue however pt reports he onlyl walks short distances at baseline. Performed seated LLE strengthening exercises. From PT standpoint, he is ready to DC home.    Follow Up Recommendations  Outpatient PT     Equipment Recommendations  None recommended by PT    Recommendations for Other Services OT consult     Precautions / Restrictions Precautions Precautions: Fall Precaution Comments: pt reports several falls, most recent being 2 days ago Restrictions Weight Bearing Restrictions: No    Mobility  Bed Mobility Overal bed mobility: Independent                Transfers Overall transfer level: Modified independent Equipment used: Rolling walker (2 wheeled) Transfers: Sit to/from Stand Sit to Stand: Modified independent (Device/Increase time)         General transfer comment: steady  Ambulation/Gait Ambulation/Gait assistance: Supervision Ambulation Distance (Feet): 300 Feet Assistive device: Rolling walker (2 wheeled) Gait Pattern/deviations: Step-through pattern;Decreased dorsiflexion - right;Decreased dorsiflexion - left Gait velocity: wfl   General Gait Details: steady with RW, no loss of balance, distance limited by fatigue (pt reports he doesn't walk very far at baseline)   Stairs   Stairs assistance: (pt declined stair training, he stated his cousin will assist him up the steps into his home)         Wheelchair Mobility    Modified Rankin (Stroke Patients Only)       Balance Overall balance assessment: Needs assistance;History of Falls Sitting-balance support: No upper extremity supported Sitting balance-Leahy Scale: Good      Standing balance support: Single extremity supported Standing balance-Leahy Scale: Fair                              Cognition Arousal/Alertness: Awake/alert Behavior During Therapy: WFL for tasks assessed/performed Overall Cognitive Status: Within Functional Limits for tasks assessed                                        Exercises General Exercises - Lower Extremity Ankle Circles/Pumps: AAROM;Both;10 reps;Seated Long Arc Quad: AAROM;Left;10 reps;Seated Hip Flexion/Marching: AROM;Left;10 reps;Seated    General Comments        Pertinent Vitals/Pain Pain Assessment: No/denies pain    Home Living                      Prior Function            PT Goals (current goals can now be found in the care plan section) Acute Rehab PT Goals Patient Stated Goal: return home PT Goal Formulation: With patient Time For Goal Achievement: 08/06/17 Potential to Achieve Goals: Good Progress towards PT goals: Progressing toward goals    Frequency    Min 3X/week      PT Plan Current plan remains appropriate    Co-evaluation              AM-PAC PT "6 Clicks" Daily Activity  Outcome Measure  Difficulty turning over in bed (including adjusting bedclothes, sheets and blankets)?: None Difficulty  moving from lying on back to sitting on the side of the bed? : None Difficulty sitting down on and standing up from a chair with arms (e.g., wheelchair, bedside commode, etc,.)?: A Little Help needed moving to and from a bed to chair (including a wheelchair)?: A Little Help needed walking in hospital room?: None Help needed climbing 3-5 steps with a railing? : A Little 6 Click Score: 21    End of Session Equipment Utilized During Treatment: Gait belt Activity Tolerance: Patient tolerated treatment well Patient left: in chair;with call bell/phone within reach Nurse Communication: Mobility status PT Visit Diagnosis: Unsteadiness on feet  (R26.81);Repeated falls (R29.6);Muscle weakness (generalized) (M62.81);Hemiplegia and hemiparesis Hemiplegia - Right/Left: Left Hemiplegia - dominant/non-dominant: Dominant Hemiplegia - caused by: Cerebral infarction     Time: 2763-9432 PT Time Calculation (min) (ACUTE ONLY): 16 min  Charges:  $Gait Training: 8-22 mins                    G Codes:          Philomena Doheny 07/25/2017, 11:43 AM [phone]

## 2017-07-26 ENCOUNTER — Other Ambulatory Visit: Payer: Self-pay | Admitting: Internal Medicine

## 2017-07-26 MED ORDER — DULAGLUTIDE 0.75 MG/0.5ML ~~LOC~~ SOAJ: 0.7500 mg | SUBCUTANEOUS | 3 refills | Status: DC

## 2017-07-26 MED FILL — GABAPENTIN 300 MG CAPSULE: 300 | 30 days supply | Qty: 30 | Fill #0

## 2017-07-26 MED FILL — CARVEDILOL 6.25 MG TABLET: 6.25 | 30 days supply | Qty: 60 | Fill #0

## 2017-07-26 MED FILL — TRULICITY 0.75 MG/0.5 ML PE: 0.75 | 28 days supply | Qty: 2 | Fill #0

## 2017-07-26 MED FILL — ATORVASTATIN 40 MG TABLET: 40 | 30 days supply | Qty: 30 | Fill #0

## 2017-07-26 MED FILL — FUROSEMIDE 20 MG TABS: 20 | 30 days supply | Qty: 30 | Fill #0

## 2017-07-26 MED FILL — LOSARTAN POTASSIUM 25 MG TA: 25 | 30 days supply | Qty: 30 | Fill #0

## 2017-07-26 NOTE — Telephone Encounter (Signed)
PT sch for an ACC appt on 07/30/2017.

## 2017-07-30 ENCOUNTER — Ambulatory Visit (INDEPENDENT_AMBULATORY_CARE_PROVIDER_SITE_OTHER): Payer: Medicare Other | Admitting: Internal Medicine

## 2017-07-30 ENCOUNTER — Other Ambulatory Visit: Payer: Self-pay

## 2017-07-30 ENCOUNTER — Ambulatory Visit: Payer: Medicare Other

## 2017-07-30 VITALS — BP 145/85 | HR 101 | Temp 98.6°F | Ht 73.5 in | Wt 232.0 lb

## 2017-07-30 DIAGNOSIS — Z8673 Personal history of transient ischemic attack (TIA), and cerebral infarction without residual deficits: Secondary | ICD-10-CM

## 2017-07-30 DIAGNOSIS — Z79899 Other long term (current) drug therapy: Secondary | ICD-10-CM

## 2017-07-30 DIAGNOSIS — N289 Disorder of kidney and ureter, unspecified: Secondary | ICD-10-CM

## 2017-07-30 DIAGNOSIS — I10 Essential (primary) hypertension: Secondary | ICD-10-CM

## 2017-07-30 DIAGNOSIS — S37009D Unspecified injury of unspecified kidney, subsequent encounter: Secondary | ICD-10-CM

## 2017-07-30 DIAGNOSIS — I110 Hypertensive heart disease with heart failure: Secondary | ICD-10-CM

## 2017-07-30 DIAGNOSIS — E118 Type 2 diabetes mellitus with unspecified complications: Secondary | ICD-10-CM

## 2017-07-30 DIAGNOSIS — D509 Iron deficiency anemia, unspecified: Secondary | ICD-10-CM

## 2017-07-30 DIAGNOSIS — Z833 Family history of diabetes mellitus: Secondary | ICD-10-CM | POA: Diagnosis not present

## 2017-07-30 DIAGNOSIS — I5023 Acute on chronic systolic (congestive) heart failure: Secondary | ICD-10-CM

## 2017-07-30 DIAGNOSIS — Z794 Long term (current) use of insulin: Secondary | ICD-10-CM | POA: Diagnosis not present

## 2017-07-30 DIAGNOSIS — I5021 Acute systolic (congestive) heart failure: Secondary | ICD-10-CM

## 2017-07-30 DIAGNOSIS — E871 Hypo-osmolality and hyponatremia: Secondary | ICD-10-CM | POA: Diagnosis not present

## 2017-07-30 DIAGNOSIS — Z8249 Family history of ischemic heart disease and other diseases of the circulatory system: Secondary | ICD-10-CM

## 2017-07-30 DIAGNOSIS — F17211 Nicotine dependence, cigarettes, in remission: Secondary | ICD-10-CM | POA: Diagnosis not present

## 2017-07-30 DIAGNOSIS — R3129 Other microscopic hematuria: Secondary | ICD-10-CM

## 2017-07-30 DIAGNOSIS — E119 Type 2 diabetes mellitus without complications: Secondary | ICD-10-CM

## 2017-07-30 LAB — FOLATE RBC
FOLATE, HEMOLYSATE: 297.1 ng/mL
Folate, RBC: 1017 ng/mL (ref >498)
HEMATOCRIT: 29.2 % — AB (ref 37.5–51.0)

## 2017-07-30 MED ORDER — CARVEDILOL 12.5 MG PO TABS: 12.5000 mg | ORAL_TABLET | Freq: Two times a day (BID) | ORAL | 0 refills | Status: DC

## 2017-07-30 MED ORDER — LOSARTAN POTASSIUM 50 MG PO TABS: 50.0000 mg | ORAL_TABLET | Freq: Every day | ORAL | 0 refills | Status: DC

## 2017-07-30 MED FILL — CARVEDILOL 12.5 MG TABLET: 12.5 | 90 days supply | Qty: 180 | Fill #0

## 2017-07-30 NOTE — Progress Notes (Signed)
CC: hospital follow-up for new onset heart failure  HPI:TommyTommy Mclaughlin is a 52 y.o. male who presents today for evaluation following a recent hospital discharge was admitted for acute on chronic systolic heart failure.  HFrEF: Patient admitted on 07/23/2017 with shortness of breath, cough generalized fatigue.  He was noted to have an LV-EF of 30 to 35% with global dyskinesia.  This appears to have been gradually worsening over time given dietary indiscretion, hypertension and illicit drug use. He was diuresed with IV Lasix with marked improvement in his dyspnea and cough and weaned off of O2 prior to discharge.  Troponins were elevated above the level expected for demand ischemia at ~0.75 but not greater than 1 which is a more definitive level for evaluation of acute cardiac ischemia.  Given his echocardiogram findings without acute focal dyskinesia or EKG with the same, and ischemic cardiomyopathy was considered less likely and he was discharged without further evaluation for potential ischemia.  In addition, given his elevated urinary creatinine heart cath versus CT coronary bypass would likely resulted in severe possibly permanent renal insult increasing the threshold for appropriate screening.    Plan: The long term goal will be to optimize the patient's medical treatment for his heart failure with tight control of his HTN and DMII as well as optimizing his HF regimen. -Increase Coreg 12.5 mg twice daily today given his persistent tachycardia and hypertension and the recommendation to reach optimal doses of this medication for true cardiac protective effects -Increase losartan from 25 mg daily to 50 mg and in an attempt to better control blood pressure. -He is advised to monitor his fluid status and weight daily and to notify our staff if he has a weight gain greater than 2 pounds in 24-hour period of 5 pounds over 72-hours. -I have placed a referral for cardiac outpatient rehab for CHF believe  he should qualify given the EF of less than or equal to 35% -We will continue to optimize his diabetes regimen to better control his serum glucose levels  Type 2 diabetes mellitus: Patient with a single hemoglobin A1c of 9.0 during admission with multiple serum glucose recordings greater than 200 consistent with diabetes.  He was started on Victoza but this was discontinued due to its cost prohibitive nature. He was instead placed on Trulicity which he prefers due to its once weekly dosing.   Plan: Continue Trulicity Evaluate at follow-up for need for increasing his diabetic regimen based on serum glucose averages.  He did not have his blood glucose monitor with him today and had not been recording measurements given that he had just the Trulicity the day prior to this visit. Placed referral for outpatient podiatry   Hypertension: Patient with marked hypertension during admission.  He was placed on losartan 20 mg and carvedilol.  Plan: If increased both as per CHF A/P. Please continue to titrate this upwards indicated to better control of hypertension Refer to ophthalmology at next visit  Renal injury: Uncertain if this is acute or chronic given the lack of previous lab parameters and data in our system as he is a new patient to our geographical area.  Patient with continued elevation in serum creatinine and GFR 48 concerning for chronic kidney disease versus slow improvement of initial renal insult.  Patient's creatinine is elevated to 1.83 today the BUN of 26.  Plan: As above obtained BMP Continue to monitor No change in diuretic dose at this time, continue furosemide 20 mg daily Mildly hyponatremic  at 145 today he will need continued monitoring for this Recommend repeat BMP at follow-up visit for Cr and electrolytes   Iron deficiency anemia: Patient will need a Colonoscopy scheduled to evaluate for colonic carcinoma. Possibly secondary to his renal disease and microscopic  hematuria  Plan: Denied hematochezia, melena, hematuria,  Recommend repeat UA and next visit    Past Medical History:  Diagnosis Date  . CVA (cerebral vascular accident) ([place])   . Heart failure ([place])   . Hypertension   . Status post CVA   . T2DM (type 2 diabetes mellitus) ([place])    Review of Systems:   Review of Systems  Constitutional: Negative for chills, diaphoresis and fever.  HENT: Negative for ear pain and sinus pain.   Eyes: Negative for blurred vision and photophobia.  Respiratory: Positive for cough (Most notably when lying flat improved with sitting up) and wheezing (Associated with the cough). Negative for shortness of breath.   Cardiovascular: Positive for leg swelling. Negative for chest pain.  Gastrointestinal: Negative for constipation, diarrhea, nausea and vomiting.  Genitourinary: Negative for flank pain and urgency.  Musculoskeletal: Negative for myalgias.  Neurological: Negative for dizziness and headaches.  Psychiatric/Behavioral: Negative for depression. The patient is not nervous/anxious.   All other systems reviewed and are negative.  Family History: Mother-COPD, HTN, Diabetes Sister-Diabetes Brother-Diabetes  Social History: Tobacco-quit smoking on 06/24/2017, used 1/2 ppd x >30 years EtOH-quit drinking EtOH after his CVA in 2017 Drugs- Last used PCP ~2.5 weeks prior to being hospitalized here.   Physical Exam:  Vitals:   07/30/17 1512  BP: (!) 145/85  Pulse: (!) 101  Temp: 98.6 F (37 C)  TempSrc: Oral  SpO2: 100%  Weight: 232 lb (105.2 kg)  Height: 6' 1.5" (1.867 m)   Physical Exam  Constitutional: He is oriented to person, place, and time. He appears well-developed and well-nourished. No distress.  HENT:  Head: Normocephalic and atraumatic.  Eyes: Pupils are equal, round, and reactive to light. Conjunctivae and EOM are normal. Right eye exhibits no discharge. Left eye exhibits no discharge.  Neck: Normal range of motion. Neck supple.  No JVD present.  Cardiovascular: Regular rhythm and normal pulses. Tachycardia present.  No murmur heard. Pulmonary/Chest: Effort normal and breath sounds normal. No stridor. No respiratory distress. He has no wheezes.  Abdominal: Soft. Bowel sounds are normal. He exhibits no distension.  Musculoskeletal: He exhibits edema (Mild edema to mid calf bilaterally). He exhibits no tenderness or deformity.  Neurological: He is alert and oriented to person, place, and time.  Skin: Skin is warm. Capillary refill takes less than 2 seconds. He is not diaphoretic.  Psychiatric: He has a normal mood and affect.  Vitals reviewed.  Assessment & Plan:   See Encounters Tab for problem based charting.  Patient discussed with Dr. Angelia Mould

## 2017-07-30 NOTE — Patient Instructions (Addendum)
FOLLOW-UP INSTRUCTIONS When: Call the clinic tomorrow and schedule with [place] for a return visit in 2-3 weeks for continued monitoring of your new health concerns.  What to bring: All his medication bottles  Thank you for your visit to [facility] internal medicine clinic today.  I have changed the dose of her Coreg/carvedilol to 12.5 mg twice a day. I also changed the dose of her losartan to 50 mg daily We will also discontinue the Victoza as it is not advisable to be on both this and the Trulicity.  I will obtain a laboratory evaluation of your kidney function and notify you of any concerning results and/or any medication changes indicated.  If you gain greater than 2 pounds in 24-hour period please notify us right away.   If you have any concerns anytime 24/7 please call our clinic you can be referred to an on-call resident if after hours.  We will attempt to resolve your issue as soon as possible.  I have placed a referral for cardiac rehab and they should contact you when this is approved.  I have placed a referral for the foot doctor and they should also be contacting you when this is available.  Heart-Healthy Eating Plan Heart-healthy meal planning includes:  Limiting unhealthy fats.  Increasing healthy fats.  Making other small dietary changes.  You may need to talk with your doctor or a diet specialist (dietitian) to create an eating plan that is right for you. What types of fat should I choose?  Choose healthy fats. These include olive oil and canola oil, flaxseeds, walnuts, almonds, and seeds.  Eat more omega-3 fats. These include salmon, mackerel, sardines, tuna, flaxseed oil, and ground flaxseeds. Try to eat fish at least twice each week.  Limit saturated fats. ? Saturated fats are often found in animal products, such as meats, butter, and cream. ? Plant sources of saturated fats include palm oil, palm kernel oil, and coconut oil.  Avoid foods with partially  hydrogenated oils in them. These include stick margarine, some tub margarines, cookies, crackers, and other baked goods. These contain trans fats. What general guidelines do I need to follow?  Check food labels carefully. Identify foods with trans fats or high amounts of saturated fat.  Fill one half of your plate with vegetables and green salads. Eat 4-5 servings of vegetables per day. A serving of vegetables is: ? 1 cup of raw leafy vegetables. ?  cup of raw or cooked cut-up vegetables. ?  cup of vegetable juice.  Fill one fourth of your plate with whole grains. Look for the word "whole" as the first word in the ingredient list.  Fill one fourth of your plate with lean protein foods.  Eat 4-5 servings of fruit per day. A serving of fruit is: ? One medium whole fruit. ?  cup of dried fruit. ?  cup of fresh, frozen, or canned fruit. ?  cup of 100% fruit juice.  Eat more foods that contain soluble fiber. These include apples, broccoli, carrots, beans, peas, and barley. Try to get 20-30 g of fiber per day.  Eat more home-cooked food. Eat less restaurant, buffet, and fast food.  Limit or avoid alcohol.  Limit foods high in starch and sugar.  Avoid fried foods.  Avoid frying your food. Try baking, boiling, grilling, or broiling it instead. You can also reduce fat by: ? Removing the skin from poultry. ? Removing all visible fats from meats. ? Skimming the fat off of stews,  soups, and gravies before serving them. ? Steaming vegetables in water or broth.  Lose weight if you are overweight.  Eat 4-5 servings of nuts, legumes, and seeds per week: ? One serving of dried beans or legumes equals  cup after being cooked. ? One serving of nuts equals 1 ounces. ? One serving of seeds equals  ounce or one tablespoon.  You may need to keep track of how much salt or sodium you eat. This is especially true if you have high blood pressure. Talk with your doctor or dietitian to get more  information. What foods can I eat? Grains Breads, including [place], white, pita, wheat, raisin, rye, oatmeal, and [place]. Tortillas that are neither fried nor made with lard or trans fat. Low-fat rolls, including hotdog and hamburger buns and English muffins. Biscuits. Muffins. Waffles. Pancakes. Light popcorn. Whole-grain cereals. Flatbread. Melba toast. Pretzels. Breadsticks. Rusks. Low-fat snacks. Low-fat crackers, including oyster, saltine, matzo, graham, animal, and rye. Rice and pasta, including brown rice and pastas that are made with whole wheat. Vegetables All vegetables. Fruits All fruits, but limit coconut. Meats and Other Protein Sources Lean, well-trimmed beef, veal, pork, and lamb. Chicken and [place] without skin. All fish and shellfish. Wild duck, rabbit, pheasant, and venison. Egg whites or low-cholesterol egg substitutes. Dried beans, peas, lentils, and tofu. Seeds and most nuts. Dairy Low-fat or nonfat cheeses, including ricotta, string, and mozzarella. Skim or 1% milk that is liquid, powdered, or evaporated. Buttermilk that is made with low-fat milk. Nonfat or low-fat yogurt. Beverages Mineral water. Diet carbonated beverages. Sweets and Desserts Sherbets and fruit ices. Honey, jam, marmalade, jelly, and syrups. Meringues and gelatins. Pure sugar candy, such as hard candy, jelly beans, gumdrops, mints, marshmallows, and small amounts of dark chocolate. [employer] Eat all sweets and desserts in moderation. Fats and Oils Nonhydrogenated (trans-free) margarines. Vegetable oils, including soybean, sesame, sunflower, olive, peanut, safflower, corn, canola, and cottonseed. Salad dressings or mayonnaise made with a vegetable oil. Limit added fats and oils that you use for cooking, baking, salads, and as spreads. Other Cocoa powder. Coffee and tea. All seasonings and condiments. The items listed above may not be a complete list of recommended foods or beverages. Contact your  dietitian for more options. What foods are not recommended? Grains Breads that are made with saturated or trans fats, oils, or whole milk. Croissants. Butter rolls. Cheese breads. Sweet rolls. Donuts. Buttered popcorn. Chow mein noodles. High-fat crackers, such as cheese or butter crackers. Meats and Other Protein Sources Fatty meats, such as hotdogs, short ribs, sausage, spareribs, bacon, rib eye roast or steak, and mutton. High-fat deli meats, such as salami and bologna. Caviar. Domestic duck and goose. Organ meats, such as kidney, liver, sweetbreads, and heart. Dairy Cream, sour cream, cream cheese, and creamed cottage cheese. Whole-milk cheeses, including blue (bleu), Monterey Jack, [place], [place]unce[place], American, Ho[place], Swiss, cheddar, Bl[place], and [place]. Whole or 2% milk that is liquid, evaporated, or condensed. Whole buttermilk. Cream sauce or high-fat cheese sauce. Yogurt that is made from whole milk. Beverages Regular sodas and juice drinks with added sugar. Sweets and Desserts Frosting. Pudding. Cookies. Cakes other than angel food cake. Candy that has milk chocolate or white chocolate, hydrogenated fat, butter, coconut, or unknown ingredients. Buttered syrups. Full-fat ice cream or ice cream drinks. Fats and Oils Gravy that has suet, meat fat, or shortening. Cocoa butter, hydrogenated oils, palm oil, coconut oil, palm kernel oil. These can often be found in baked products, candy, fried foods,  nondairy creamers, and whipped toppings. Solid fats and shortenings, including bacon fat, salt pork, lard, and butter. Nondairy cream substitutes, such as coffee creamers and sour cream substitutes. Salad dressings that are made of unknown oils, cheese, or sour cream. The items listed above may not be a complete list of foods and beverages to avoid. Contact your dietitian for more information. This information is not intended to replace advice given to you by your health care provider. Make sure you  discuss any questions you have with your health care provider. Document Released: 09/04/2011 Document Revised: 08/11/2015 Document Reviewed: 08/27/2013 Elsevier Interactive Patient Education  [employer].

## 2017-07-31 DIAGNOSIS — D509 Iron deficiency anemia, unspecified: Secondary | ICD-10-CM | POA: Insufficient documentation

## 2017-07-31 DIAGNOSIS — N179 Acute kidney failure, unspecified: Secondary | ICD-10-CM | POA: Insufficient documentation

## 2017-07-31 LAB — BMP8+ANION GAP
ANION GAP: 14 mmol/L (ref 10.0–18.0)
BUN/Creatinine Ratio: 14 (ref 9–20)
BUN: 26 mg/dL — ABNORMAL HIGH (ref 6–24)
CALCIUM: 8.1 mg/dL — AB (ref 8.7–10.2)
CHLORIDE: 108 mmol/L — AB (ref 96–106)
CO2: 23 mmol/L (ref 20–29)
Creatinine, Ser: 1.83 mg/dL — ABNORMAL HIGH (ref 0.76–1.27)
GFR calc non Af Amer: 42 mL/min/{1.73_m2} — ABNORMAL LOW (ref >59)
GFR, EST AFRICAN AMERICAN: 48 mL/min/{1.73_m2} — AB (ref >59)
GLUCOSE: 204 mg/dL — AB (ref 65–99)
Potassium: 4.8 mmol/L (ref 3.5–5.2)
Sodium: 145 mmol/L — ABNORMAL HIGH (ref 134–144)

## 2017-07-31 NOTE — Assessment & Plan Note (Signed)
HFrEF: Patient admitted on 07/23/2017 with shortness of breath, cough generalized fatigue.  He was noted to have an LV-EF of 30 to 35% with global dyskinesia.  This appears to have been gradually worsening over time given dietary indiscretion, hypertension and illicit drug use. He was diuresed with IV Lasix with marked improvement in his dyspnea and cough and weaned off of O2 prior to discharge.  Troponins were elevated above the level expected for demand ischemia at ~0.75 but not greater than 1 which is a more definitive level for evaluation of acute cardiac ischemia.  Given his echocardiogram findings without acute focal dyskinesia or EKG with the same, and ischemic cardiomyopathy was considered less likely and he was discharged without further evaluation for potential ischemia.  In addition, given his elevated urinary creatinine heart cath versus CT coronary bypass would likely resulted in severe possibly permanent renal insult increasing the threshold for appropriate screening.    Plan: The long term goal will be to optimize the patient's medical treatment for his heart failure with tight control of his HTN and DMII as well as optimizing his HF regimen. -Increase Coreg 12.5 mg twice daily today given his persistent tachycardia and hypertension and the recommendation to reach optimal doses of this medication for true cardiac protective effects -Increase losartan from 25 mg daily to 50 mg and in an attempt to better control blood pressure. -He is advised to monitor his fluid status and weight daily and to notify our staff if he has a weight gain greater than 2 pounds in 24-hour period of 5 pounds over 72-hours. -I have placed a referral for cardiac outpatient rehab for CHF believe he should qualify given the EF of less than or equal to 35% -We will continue to optimize his diabetes regimen to better control his serum glucose levels

## 2017-07-31 NOTE — Assessment & Plan Note (Signed)
  Type 2 diabetes mellitus: Patient with a single hemoglobin A1c of 9.0 during admission with multiple serum glucose recordings greater than 200 consistent with diabetes.  He was started on Victoza but this was discontinued due to its cost prohibitive nature. He was instead placed on Trulicity which he prefers due to its once weekly dosing.   Plan: Continue Trulicity Evaluate at follow-up for need for increasing his diabetic regimen based on serum glucose averages.  He did not have his blood glucose monitor with him today and had not been recording measurements given that he had just the Trulicity the day prior to this visit. Placed referral for outpatient podiatry

## 2017-07-31 NOTE — Assessment & Plan Note (Signed)
  Renal injury: Uncertain if this is acute or chronic given the lack of previous lab parameters and data in our system as he is a new patient to our geographical area.  Patient with continued elevation in serum creatinine and GFR 48 concerning for chronic kidney disease versus slow improvement of initial renal insult.  Patient's creatinine is elevated to 1.83 today the BUN of 26.  Plan: As above obtained BMP Continue to monitor No change in diuretic dose at this time, continue furosemide 20 mg daily Mildly hyponatremic at 145 today he will need continued monitoring for this Recommend repeat BMP at follow-up visit for Cr and electrolytes

## 2017-07-31 NOTE — Assessment & Plan Note (Signed)
Iron deficiency anemia: Patient will need a Colonoscopy scheduled to evaluate for colonic carcinoma. Possibly secondary to his renal disease and microscopic hematuria  Plan: Denied hematochezia, melena, hematuria,  Recommend repeat UA and next visit

## 2017-07-31 NOTE — Telephone Encounter (Signed)
Patient completed HFU yesterday. Hubbard Hartshorn, RN, BSN

## 2017-07-31 NOTE — Assessment & Plan Note (Signed)
  Hypertension: Patient with marked hypertension during admission.  He was placed on losartan 20 mg and carvedilol.  Plan: If increased both as per CHF A/P. Please continue to titrate this upwards indicated to better control of hypertension Refer to ophthalmology at next visit

## 2017-08-01 ENCOUNTER — Telehealth: Payer: Self-pay | Admitting: Internal Medicine

## 2017-08-01 DIAGNOSIS — I110 Hypertensive heart disease with heart failure: Secondary | ICD-10-CM | POA: Diagnosis not present

## 2017-08-01 DIAGNOSIS — I5021 Acute systolic (congestive) heart failure: Secondary | ICD-10-CM | POA: Diagnosis not present

## 2017-08-01 DIAGNOSIS — F17210 Nicotine dependence, cigarettes, uncomplicated: Secondary | ICD-10-CM | POA: Diagnosis not present

## 2017-08-01 DIAGNOSIS — E119 Type 2 diabetes mellitus without complications: Secondary | ICD-10-CM | POA: Diagnosis not present

## 2017-08-01 DIAGNOSIS — I69354 Hemiplegia and hemiparesis following cerebral infarction affecting left non-dominant side: Secondary | ICD-10-CM | POA: Diagnosis not present

## 2017-08-01 DIAGNOSIS — I161 Hypertensive emergency: Secondary | ICD-10-CM | POA: Diagnosis not present

## 2017-08-01 NOTE — Telephone Encounter (Signed)
Agree. thanks

## 2017-08-01 NOTE — Telephone Encounter (Signed)
VO for [employer] CSW for community services, assistance. Do you agree?

## 2017-08-01 NOTE — Progress Notes (Signed)
Internal Medicine Clinic Attending  Case discussed with Dr. Berline Lopes at the time of the visit.  We reviewed the resident's history and exam and pertinent patient test results.  I agree with the assessment, diagnosis, and plan of care documented in the resident's note. Agree with increasing BB and ARB, will need close follow up, also needs referal to Cardiology for ischemic evaluation as cause of his low EF heart failure (and positive troponin)

## 2017-08-01 NOTE — Telephone Encounter (Signed)
Rec'd call from [facility] RN "Charlean Merl"  Requesting a Social Worker for this patient.

## 2017-08-02 NOTE — Addendum Note (Signed)
Addended by: Nicola Girt on: 08/02/2017 08:09 AM   Modules accepted: Level of Service

## 2017-08-03 DIAGNOSIS — I5021 Acute systolic (congestive) heart failure: Secondary | ICD-10-CM | POA: Diagnosis not present

## 2017-08-03 DIAGNOSIS — E119 Type 2 diabetes mellitus without complications: Secondary | ICD-10-CM | POA: Diagnosis not present

## 2017-08-03 DIAGNOSIS — I69354 Hemiplegia and hemiparesis following cerebral infarction affecting left non-dominant side: Secondary | ICD-10-CM | POA: Diagnosis not present

## 2017-08-03 DIAGNOSIS — I161 Hypertensive emergency: Secondary | ICD-10-CM | POA: Diagnosis not present

## 2017-08-03 DIAGNOSIS — I110 Hypertensive heart disease with heart failure: Secondary | ICD-10-CM | POA: Diagnosis not present

## 2017-08-03 DIAGNOSIS — F17210 Nicotine dependence, cigarettes, uncomplicated: Secondary | ICD-10-CM | POA: Diagnosis not present

## 2017-08-05 DIAGNOSIS — F17210 Nicotine dependence, cigarettes, uncomplicated: Secondary | ICD-10-CM | POA: Diagnosis not present

## 2017-08-05 DIAGNOSIS — I110 Hypertensive heart disease with heart failure: Secondary | ICD-10-CM | POA: Diagnosis not present

## 2017-08-05 DIAGNOSIS — I5021 Acute systolic (congestive) heart failure: Secondary | ICD-10-CM | POA: Diagnosis not present

## 2017-08-05 DIAGNOSIS — I69354 Hemiplegia and hemiparesis following cerebral infarction affecting left non-dominant side: Secondary | ICD-10-CM | POA: Diagnosis not present

## 2017-08-05 DIAGNOSIS — I161 Hypertensive emergency: Secondary | ICD-10-CM | POA: Diagnosis not present

## 2017-08-05 DIAGNOSIS — E119 Type 2 diabetes mellitus without complications: Secondary | ICD-10-CM | POA: Diagnosis not present

## 2017-08-06 ENCOUNTER — Encounter: Payer: Self-pay | Admitting: *Deleted

## 2017-08-06 DIAGNOSIS — I161 Hypertensive emergency: Secondary | ICD-10-CM | POA: Diagnosis not present

## 2017-08-06 DIAGNOSIS — I110 Hypertensive heart disease with heart failure: Secondary | ICD-10-CM | POA: Diagnosis not present

## 2017-08-06 DIAGNOSIS — F17210 Nicotine dependence, cigarettes, uncomplicated: Secondary | ICD-10-CM | POA: Diagnosis not present

## 2017-08-06 DIAGNOSIS — I69354 Hemiplegia and hemiparesis following cerebral infarction affecting left non-dominant side: Secondary | ICD-10-CM | POA: Diagnosis not present

## 2017-08-06 DIAGNOSIS — I5021 Acute systolic (congestive) heart failure: Secondary | ICD-10-CM | POA: Diagnosis not present

## 2017-08-06 DIAGNOSIS — E119 Type 2 diabetes mellitus without complications: Secondary | ICD-10-CM | POA: Diagnosis not present

## 2017-08-07 DIAGNOSIS — I110 Hypertensive heart disease with heart failure: Secondary | ICD-10-CM | POA: Diagnosis not present

## 2017-08-07 DIAGNOSIS — F17210 Nicotine dependence, cigarettes, uncomplicated: Secondary | ICD-10-CM | POA: Diagnosis not present

## 2017-08-07 DIAGNOSIS — I69354 Hemiplegia and hemiparesis following cerebral infarction affecting left non-dominant side: Secondary | ICD-10-CM | POA: Diagnosis not present

## 2017-08-07 DIAGNOSIS — E119 Type 2 diabetes mellitus without complications: Secondary | ICD-10-CM | POA: Diagnosis not present

## 2017-08-07 DIAGNOSIS — I5021 Acute systolic (congestive) heart failure: Secondary | ICD-10-CM | POA: Diagnosis not present

## 2017-08-07 DIAGNOSIS — I161 Hypertensive emergency: Secondary | ICD-10-CM | POA: Diagnosis not present

## 2017-08-08 ENCOUNTER — Telehealth: Payer: Self-pay | Admitting: *Deleted

## 2017-08-08 DIAGNOSIS — F17210 Nicotine dependence, cigarettes, uncomplicated: Secondary | ICD-10-CM | POA: Diagnosis not present

## 2017-08-08 DIAGNOSIS — I161 Hypertensive emergency: Secondary | ICD-10-CM | POA: Diagnosis not present

## 2017-08-08 DIAGNOSIS — I5021 Acute systolic (congestive) heart failure: Secondary | ICD-10-CM | POA: Diagnosis not present

## 2017-08-08 DIAGNOSIS — I110 Hypertensive heart disease with heart failure: Secondary | ICD-10-CM | POA: Diagnosis not present

## 2017-08-08 DIAGNOSIS — E119 Type 2 diabetes mellitus without complications: Secondary | ICD-10-CM | POA: Diagnosis not present

## 2017-08-08 DIAGNOSIS — I69354 Hemiplegia and hemiparesis following cerebral infarction affecting left non-dominant side: Secondary | ICD-10-CM | POA: Diagnosis not present

## 2017-08-08 MED FILL — LOSARTAN POTASSIUM 50 MG TA: 50 | 90 days supply | Qty: 90 | Fill #0

## 2017-08-08 NOTE — Telephone Encounter (Signed)
Called Angel,RN [facility] - no answer; left message to call back about Coreg dosage.

## 2017-08-08 NOTE — Telephone Encounter (Signed)
Coreg 12.5 mg BID is correct Dr Darnell Level

## 2017-08-08 NOTE — Telephone Encounter (Signed)
Call from [place] from [facility] - needs to clarify dose of Coreg. States pt has 6.25 mg and 12.5 mg bottles take BID. After reading Dr Nelma Rothman note on 5/14 visit - it was increased. Just wants to be sure this is correct since he was d/c from the hospital on 6.25 mg BID. Pt stated he has been taking both meds. Nurse states BP is ok today when checked. Thanks

## 2017-08-09 NOTE — Telephone Encounter (Signed)
Called [facility] - no answer; left message to give [place] a call back.

## 2017-08-12 DIAGNOSIS — I110 Hypertensive heart disease with heart failure: Secondary | ICD-10-CM | POA: Diagnosis not present

## 2017-08-12 DIAGNOSIS — I69354 Hemiplegia and hemiparesis following cerebral infarction affecting left non-dominant side: Secondary | ICD-10-CM | POA: Diagnosis not present

## 2017-08-12 DIAGNOSIS — E119 Type 2 diabetes mellitus without complications: Secondary | ICD-10-CM | POA: Diagnosis not present

## 2017-08-12 DIAGNOSIS — I5021 Acute systolic (congestive) heart failure: Secondary | ICD-10-CM | POA: Diagnosis not present

## 2017-08-12 DIAGNOSIS — I161 Hypertensive emergency: Secondary | ICD-10-CM | POA: Diagnosis not present

## 2017-08-12 DIAGNOSIS — F17210 Nicotine dependence, cigarettes, uncomplicated: Secondary | ICD-10-CM | POA: Diagnosis not present

## 2017-08-13 ENCOUNTER — Telehealth: Payer: Self-pay

## 2017-08-13 DIAGNOSIS — I69354 Hemiplegia and hemiparesis following cerebral infarction affecting left non-dominant side: Secondary | ICD-10-CM | POA: Diagnosis not present

## 2017-08-13 DIAGNOSIS — E119 Type 2 diabetes mellitus without complications: Secondary | ICD-10-CM | POA: Diagnosis not present

## 2017-08-13 DIAGNOSIS — I5021 Acute systolic (congestive) heart failure: Secondary | ICD-10-CM | POA: Diagnosis not present

## 2017-08-13 DIAGNOSIS — I161 Hypertensive emergency: Secondary | ICD-10-CM | POA: Diagnosis not present

## 2017-08-13 DIAGNOSIS — F17210 Nicotine dependence, cigarettes, uncomplicated: Secondary | ICD-10-CM | POA: Diagnosis not present

## 2017-08-13 DIAGNOSIS — I110 Hypertensive heart disease with heart failure: Secondary | ICD-10-CM | POA: Diagnosis not present

## 2017-08-13 NOTE — Telephone Encounter (Signed)
rec'd message stating the person at this ph is not accepting calls at this time. Will call later

## 2017-08-13 NOTE — Telephone Encounter (Signed)
Talked to [facility] - informed Coreg dose is 12.5 mg BID - repeated correctly.

## 2017-08-13 NOTE — Telephone Encounter (Signed)
Tommy Mclaughlin [facility] calls and reports that pt is having weights that vary: Sat 5/25 wt 217.6 Sun 5/26 wt 224 mon 5/27 wt 219 tues 5/28 wt 220 The wts are per a set up monitor of scales by [facility] Pt was confused about taking his lasix, told [facility]N he was supposed to take 2 tabs daily, she states she does not think he takes lasix daily His BP today was 156/92 Today his lung sounds are diminished thruout both lung fields and has a constant dry, hacking cough, pt states when reclines at night for sleep he can hear his lungs become gurggly Today he has 2+ pitting edema bilateral lower legs and feet She started pt on pillbox today Pt has appt 5/29 [place] Will call rachel at [place]

## 2017-08-13 NOTE — Telephone Encounter (Signed)
I agree

## 2017-08-13 NOTE — Telephone Encounter (Signed)
furosemide (LASIX) 20 MG tablet, refill request @ outpatient pharmacy.

## 2017-08-14 ENCOUNTER — Encounter: Payer: Self-pay | Admitting: Internal Medicine

## 2017-08-14 ENCOUNTER — Ambulatory Visit (INDEPENDENT_AMBULATORY_CARE_PROVIDER_SITE_OTHER): Payer: Medicare Other | Admitting: Internal Medicine

## 2017-08-14 ENCOUNTER — Telehealth: Payer: Self-pay

## 2017-08-14 VITALS — BP 119/70 | HR 91 | Temp 98.0°F | Wt 221.6 lb

## 2017-08-14 DIAGNOSIS — I5021 Acute systolic (congestive) heart failure: Secondary | ICD-10-CM

## 2017-08-14 DIAGNOSIS — E118 Type 2 diabetes mellitus with unspecified complications: Secondary | ICD-10-CM | POA: Diagnosis not present

## 2017-08-14 DIAGNOSIS — Z87891 Personal history of nicotine dependence: Secondary | ICD-10-CM | POA: Diagnosis not present

## 2017-08-14 DIAGNOSIS — I69354 Hemiplegia and hemiparesis following cerebral infarction affecting left non-dominant side: Secondary | ICD-10-CM | POA: Diagnosis not present

## 2017-08-14 DIAGNOSIS — M25569 Pain in unspecified knee: Secondary | ICD-10-CM

## 2017-08-14 DIAGNOSIS — M153 Secondary multiple arthritis: Secondary | ICD-10-CM | POA: Diagnosis not present

## 2017-08-14 DIAGNOSIS — I161 Hypertensive emergency: Secondary | ICD-10-CM | POA: Diagnosis not present

## 2017-08-14 DIAGNOSIS — Z836 Family history of other diseases of the respiratory system: Secondary | ICD-10-CM

## 2017-08-14 DIAGNOSIS — I110 Hypertensive heart disease with heart failure: Secondary | ICD-10-CM | POA: Diagnosis not present

## 2017-08-14 DIAGNOSIS — Z8249 Family history of ischemic heart disease and other diseases of the circulatory system: Secondary | ICD-10-CM

## 2017-08-14 DIAGNOSIS — E119 Type 2 diabetes mellitus without complications: Secondary | ICD-10-CM | POA: Diagnosis not present

## 2017-08-14 DIAGNOSIS — Z833 Family history of diabetes mellitus: Secondary | ICD-10-CM | POA: Diagnosis not present

## 2017-08-14 DIAGNOSIS — F17210 Nicotine dependence, cigarettes, uncomplicated: Secondary | ICD-10-CM | POA: Diagnosis not present

## 2017-08-14 MED ORDER — FUROSEMIDE 40 MG PO TABS: 40.0000 mg | ORAL_TABLET | Freq: Two times a day (BID) | ORAL | 0 refills | Status: DC

## 2017-08-14 MED ORDER — TRAMADOL HCL 50 MG PO TABS: 50.0000 mg | ORAL_TABLET | Freq: Two times a day (BID) | ORAL | 0 refills | Status: DC | PRN

## 2017-08-14 MED ORDER — FUROSEMIDE 40 MG PO TABS: 40.0000 mg | ORAL_TABLET | Freq: Every day | ORAL | 0 refills | Status: DC

## 2017-08-14 MED FILL — FUROSEMIDE 40 MG TAB: 40 | 30 days supply | Qty: 60 | Fill #0

## 2017-08-14 MED FILL — traMADol HCL 50 MG TABS: 50 | 10 days supply | Qty: 20 | Fill #0

## 2017-08-14 NOTE — Telephone Encounter (Signed)
HH OT ask for VO OT 1x week for 1 week, 2x week for 3 weeks for safety, strengthening, conditioning and adl's, VO given do you agree?

## 2017-08-14 NOTE — Telephone Encounter (Signed)
Yes. I agree. Okay to give.

## 2017-08-14 NOTE — Telephone Encounter (Signed)
Tommy Mclaughlin with [facility] requesting VO for OT. Please call back.

## 2017-08-14 NOTE — Patient Instructions (Addendum)
We will give you a short course of tramadol for your knee pain.  We will refer you to cardiology and podiatry.  We have increased your lasix dose to 40mg  twice daily.  Please take this for the next week and follow up next week.  We have ordered you ted hose for your legs.  Please keep a log of your weights at home and bring this back with you as we discussed.

## 2017-08-14 NOTE — Progress Notes (Signed)
CC: knee pain  HPI:  TommyTommy Mclaughlin is a 52 y.o. male with PMH below.  He is here to address his knee pain but we will also address his HFrEF.    Please see A&P for status of the patient's chronic medical conditions  Past Medical History:  Diagnosis Date  . CVA (cerebral vascular accident) ([place])   . Heart failure ([place])   . Hypertension   . Status post CVA   . T2DM (type 2 diabetes mellitus) ([place])    Review of Systems:  ROS: Pulmonary: pt denies increased work of breathing, shortness of breath,  Cardiac: pt denies palpitations, chest pain,  Abdominal: pt denies abdominal pain, nausea, vomiting, or diarrhea  Physical Exam:  Vitals:   08/14/17 1541  BP: 119/70  Pulse: 91  Temp: 98 F (36.7 C)  TempSrc: Oral  SpO2: 100%  Weight: 221 lb 9.6 oz (100.5 kg)   Physical Exam  Constitutional: He appears well-developed and well-nourished.  Eyes: Right eye exhibits no discharge. Left eye exhibits no discharge. No scleral icterus.  Neck: JVD (to ear) present.  Cardiovascular: Normal rate, regular rhythm, normal heart sounds and intact distal pulses. Exam reveals no gallop and no friction rub.  No murmur heard. Pulmonary/Chest: Effort normal and breath sounds normal. No respiratory distress. He has no wheezes. He has no rales (none appreciated but pt could not take a deep breath without coughing).  Abdominal: Soft. Bowel sounds are normal. He exhibits no distension and no mass. There is no tenderness. There is no guarding.  Musculoskeletal: He exhibits edema (2+ pitting LE bilaterally).  Bilateral knee crepitus appreciated.  No bony tenderness to palpation.  No erythema, no palpable effusions  Neurological: He is alert.    Social History   Socioeconomic History  . Marital status: Widowed    Spouse name: Not on file  . Number of children: Not on file  . Years of education: Not on file  . Highest education level: Not on file  Occupational History  . Occupation: Air cabin crew    Comment: On disability s/p CVA  Social Needs  . Financial resource strain: Not hard at all  . Food insecurity:    Worry: Never true    Inability: Never true  . Transportation needs:    Medical: No    Non-medical: No  Tobacco Use  . Smoking status: Former Smoker    Packs/day: 0.50    Types: Cigarettes    Last attempt to quit: 06/24/2016    Years since quitting: 1.1  . Smokeless tobacco: Never Used  . Tobacco comment: wearing patches  Substance and Sexual Activity  . Alcohol use: Not Currently    Comment: stopped after CVA 2017  . Drug use: Not Currently  . Sexual activity: Not on file  Lifestyle  . Physical activity:    Days per week: 2 days    Minutes per session: Not on file  . Stress: To some extent  Relationships  . Social connections:    Talks on phone: More than three times a week    Gets together: More than three times a week    Attends religious service: Not on file    Active member of club or organization: No    Attends meetings of clubs or organizations: Never    Relationship status: Widowed  . Intimate partner violence:    Fear of current or ex partner: No    Emotionally abused: No    Physically abused: No  Forced sexual activity: No  Other Topics Concern  . Not on file  Social History Narrative   Tommy Mclaughlin moved to [facility] 07/14/17 from [place] where he resided while taking care of his mother. He currently lives with his cousin, who is a Administrator. He has one adult daughter (age 69).     Family History  Problem Relation Age of Onset  . COPD Mother   . Hypertension Mother   . Diabetes Mother   . Diabetes Sister   . Diabetes Brother     Assessment & Plan:   See Encounters Tab for problem based charting.  Patient discussed with Dr. Angelia Mould

## 2017-08-15 DIAGNOSIS — I161 Hypertensive emergency: Secondary | ICD-10-CM | POA: Diagnosis not present

## 2017-08-15 DIAGNOSIS — F17210 Nicotine dependence, cigarettes, uncomplicated: Secondary | ICD-10-CM | POA: Diagnosis not present

## 2017-08-15 DIAGNOSIS — I110 Hypertensive heart disease with heart failure: Secondary | ICD-10-CM | POA: Diagnosis not present

## 2017-08-15 DIAGNOSIS — I5021 Acute systolic (congestive) heart failure: Secondary | ICD-10-CM | POA: Diagnosis not present

## 2017-08-15 DIAGNOSIS — E119 Type 2 diabetes mellitus without complications: Secondary | ICD-10-CM | POA: Diagnosis not present

## 2017-08-15 DIAGNOSIS — I69354 Hemiplegia and hemiparesis following cerebral infarction affecting left non-dominant side: Secondary | ICD-10-CM | POA: Diagnosis not present

## 2017-08-15 DIAGNOSIS — M1990 Unspecified osteoarthritis, unspecified site: Secondary | ICD-10-CM | POA: Insufficient documentation

## 2017-08-15 NOTE — Assessment & Plan Note (Signed)
Pt requires refills on medications with associated diagnosis above.  Reviewed disease process and find this medication to be necessary, will not change dose or alter current therapy.  -additionally patient needs podiatry referral for toenail care and callous build up

## 2017-08-15 NOTE — Assessment & Plan Note (Addendum)
Pt reports some continued orthopnea, exercise capacity more limited by his LE swelling and knee pain at the moment, not getting overly short of breath with ambulation.  Weights fluctuating on home scale but overall looks stable around 220lbs.  Patient only reports urinating about 4-5 times per day he titrated his lasix up to 40mg  per day.  Pt is adhering to sodium restriction diet.   -bp excellent today HR can tolerate increase in BB however will not increase as pt requires more diuresis -continue losartan 50mg  daily -will increase lasix to 40mg  BID, follow up bmp next week -pt asked to bring log of his daily weights -cardiology referral as pt will require ischemic workup in future -compression hose ordered for LE edema -get lipid panel next visit -start iron therapy if pt has not already started

## 2017-08-15 NOTE — Assessment & Plan Note (Signed)
Last appointment of the day, pt would likely benefit from knee injections.  No erythema or infectious symptoms, exam consistent with classic osteoarthritis.    -gave one week of tramadol 50mg  BID PRN to bridge pt to injections

## 2017-08-16 NOTE — Progress Notes (Signed)
Internal Medicine Clinic Attending  Case discussed with Dr. Winfrey  at the time of the visit.  We reviewed the resident's history and exam and pertinent patient test results.  I agree with the assessment, diagnosis, and plan of care documented in the resident's note.  

## 2017-08-17 DIAGNOSIS — I161 Hypertensive emergency: Secondary | ICD-10-CM | POA: Diagnosis not present

## 2017-08-17 DIAGNOSIS — I69354 Hemiplegia and hemiparesis following cerebral infarction affecting left non-dominant side: Secondary | ICD-10-CM | POA: Diagnosis not present

## 2017-08-17 DIAGNOSIS — I5021 Acute systolic (congestive) heart failure: Secondary | ICD-10-CM | POA: Diagnosis not present

## 2017-08-17 DIAGNOSIS — E119 Type 2 diabetes mellitus without complications: Secondary | ICD-10-CM | POA: Diagnosis not present

## 2017-08-17 DIAGNOSIS — I110 Hypertensive heart disease with heart failure: Secondary | ICD-10-CM | POA: Diagnosis not present

## 2017-08-17 DIAGNOSIS — F17210 Nicotine dependence, cigarettes, uncomplicated: Secondary | ICD-10-CM | POA: Diagnosis not present

## 2017-08-19 DIAGNOSIS — I5021 Acute systolic (congestive) heart failure: Secondary | ICD-10-CM | POA: Diagnosis not present

## 2017-08-19 DIAGNOSIS — E119 Type 2 diabetes mellitus without complications: Secondary | ICD-10-CM | POA: Diagnosis not present

## 2017-08-19 DIAGNOSIS — I110 Hypertensive heart disease with heart failure: Secondary | ICD-10-CM | POA: Diagnosis not present

## 2017-08-19 DIAGNOSIS — F17210 Nicotine dependence, cigarettes, uncomplicated: Secondary | ICD-10-CM | POA: Diagnosis not present

## 2017-08-19 DIAGNOSIS — I161 Hypertensive emergency: Secondary | ICD-10-CM | POA: Diagnosis not present

## 2017-08-19 DIAGNOSIS — I69354 Hemiplegia and hemiparesis following cerebral infarction affecting left non-dominant side: Secondary | ICD-10-CM | POA: Diagnosis not present

## 2017-08-20 ENCOUNTER — Ambulatory Visit: Payer: Medicare Other | Admitting: Orthotics

## 2017-08-20 ENCOUNTER — Ambulatory Visit (INDEPENDENT_AMBULATORY_CARE_PROVIDER_SITE_OTHER): Payer: Medicare Other | Admitting: Podiatry

## 2017-08-20 ENCOUNTER — Encounter: Payer: Self-pay | Admitting: Podiatry

## 2017-08-20 DIAGNOSIS — I739 Peripheral vascular disease, unspecified: Secondary | ICD-10-CM

## 2017-08-20 DIAGNOSIS — E1149 Type 2 diabetes mellitus with other diabetic neurological complication: Secondary | ICD-10-CM | POA: Diagnosis not present

## 2017-08-20 DIAGNOSIS — E1140 Type 2 diabetes mellitus with diabetic neuropathy, unspecified: Secondary | ICD-10-CM

## 2017-08-20 DIAGNOSIS — M79674 Pain in right toe(s): Secondary | ICD-10-CM

## 2017-08-20 DIAGNOSIS — M2041 Other hammer toe(s) (acquired), right foot: Secondary | ICD-10-CM | POA: Diagnosis not present

## 2017-08-20 DIAGNOSIS — M79675 Pain in left toe(s): Secondary | ICD-10-CM | POA: Diagnosis not present

## 2017-08-20 DIAGNOSIS — M216X9 Other acquired deformities of unspecified foot: Secondary | ICD-10-CM

## 2017-08-20 DIAGNOSIS — B351 Tinea unguium: Secondary | ICD-10-CM | POA: Diagnosis not present

## 2017-08-20 DIAGNOSIS — M2042 Other hammer toe(s) (acquired), left foot: Secondary | ICD-10-CM | POA: Diagnosis not present

## 2017-08-20 DIAGNOSIS — Q828 Other specified congenital malformations of skin: Secondary | ICD-10-CM | POA: Diagnosis not present

## 2017-08-20 MED FILL — TRULICITY 0.75 MG/0.5 ML PE: 0.75 | 28 days supply | Qty: 2 | Fill #1

## 2017-08-20 NOTE — Progress Notes (Signed)
Patient measured/cast for diabetic shoes/inserts.  Patient has moderate lower extremity edema due to CHF; came in at risk due to barefooted (socks only).  Gave him a pair of apex 2000 to wear out; patient chose apex 1200 as order.   Patient says MD is Actor. Jacqualyn Posey is DPM

## 2017-08-20 NOTE — Patient Instructions (Signed)

## 2017-08-21 ENCOUNTER — Other Ambulatory Visit: Payer: Self-pay | Admitting: Internal Medicine

## 2017-08-21 ENCOUNTER — Other Ambulatory Visit: Payer: Self-pay

## 2017-08-21 ENCOUNTER — Ambulatory Visit (HOSPITAL_COMMUNITY)
Admission: RE | Admit: 2017-08-21 | Discharge: 2017-08-21 | Disposition: A | Discharge status: Home / Self Care | Payer: Medicare Other | Source: Ambulatory Visit | Attending: Student in an Organized Health Care Education/Training Program | Admitting: Student in an Organized Health Care Education/Training Program

## 2017-08-21 ENCOUNTER — Encounter (HOSPITAL_COMMUNITY): Payer: Self-pay

## 2017-08-21 ENCOUNTER — Ambulatory Visit (INDEPENDENT_AMBULATORY_CARE_PROVIDER_SITE_OTHER): Payer: Medicare Other | Admitting: Internal Medicine

## 2017-08-21 VITALS — BP 97/54 | HR 102 | Temp 98.0°F | Ht 74.0 in | Wt 216.6 lb

## 2017-08-21 DIAGNOSIS — Z87891 Personal history of nicotine dependence: Secondary | ICD-10-CM

## 2017-08-21 DIAGNOSIS — I10 Essential (primary) hypertension: Secondary | ICD-10-CM

## 2017-08-21 DIAGNOSIS — M159 Polyosteoarthritis, unspecified: Secondary | ICD-10-CM

## 2017-08-21 DIAGNOSIS — Z79899 Other long term (current) drug therapy: Secondary | ICD-10-CM | POA: Diagnosis not present

## 2017-08-21 DIAGNOSIS — E119 Type 2 diabetes mellitus without complications: Secondary | ICD-10-CM | POA: Diagnosis not present

## 2017-08-21 DIAGNOSIS — I69354 Hemiplegia and hemiparesis following cerebral infarction affecting left non-dominant side: Secondary | ICD-10-CM | POA: Diagnosis not present

## 2017-08-21 DIAGNOSIS — M150 Primary generalized (osteo)arthritis: Secondary | ICD-10-CM | POA: Diagnosis not present

## 2017-08-21 DIAGNOSIS — M1711 Unilateral primary osteoarthritis, right knee: Secondary | ICD-10-CM | POA: Diagnosis not present

## 2017-08-21 DIAGNOSIS — F17210 Nicotine dependence, cigarettes, uncomplicated: Secondary | ICD-10-CM | POA: Diagnosis not present

## 2017-08-21 DIAGNOSIS — I110 Hypertensive heart disease with heart failure: Secondary | ICD-10-CM

## 2017-08-21 DIAGNOSIS — Z79891 Long term (current) use of opiate analgesic: Secondary | ICD-10-CM | POA: Diagnosis not present

## 2017-08-21 DIAGNOSIS — M1712 Unilateral primary osteoarthritis, left knee: Secondary | ICD-10-CM | POA: Diagnosis not present

## 2017-08-21 DIAGNOSIS — M170 Bilateral primary osteoarthritis of knee: Secondary | ICD-10-CM | POA: Diagnosis not present

## 2017-08-21 DIAGNOSIS — I5021 Acute systolic (congestive) heart failure: Secondary | ICD-10-CM | POA: Diagnosis not present

## 2017-08-21 DIAGNOSIS — I161 Hypertensive emergency: Secondary | ICD-10-CM | POA: Diagnosis not present

## 2017-08-21 LAB — GLUCOSE, CAPILLARY: Glucose-Capillary: 129 mg/dL — ABNORMAL HIGH (ref 65–99)

## 2017-08-21 MED ORDER — FUROSEMIDE 40 MG PO TABS: 40.0000 mg | ORAL_TABLET | Freq: Every day | ORAL | 0 refills | Status: AC

## 2017-08-21 MED ORDER — TRAMADOL HCL 50 MG PO TABS: 50.0000 mg | ORAL_TABLET | Freq: Two times a day (BID) | ORAL | 0 refills | Status: AC | PRN

## 2017-08-21 NOTE — Assessment & Plan Note (Addendum)
She with complaints of knee pain today reports that tramadol has provided some relief.  No effusions present on exam.  We will obtain x-rays the need for further evaluation before doing any knee injections.  We will give him other short course of tramadol to bridge into his next visit.

## 2017-08-21 NOTE — Patient Instructions (Addendum)
Tommy Mclaughlin,   I am going to decrease her Lasix dose back down to 40 mg daily.  I would like free to get some x-rays of your knees so we can further evaluate them.  I will refill your tramadol for another 3 weeks and have you follow-up with [place] in 2 to 3 weeks to see how you are doing for possible knee injection.  Please follow with with [place] in 2 to 3 weeks for recheck.

## 2017-08-21 NOTE — Progress Notes (Signed)
   CC: HFrEF follow up  HPI:  [name] is a 52 y.o. male with a past medical history listed below here today for follow up of his recently diagnosed HFrEF.   For further details of today's visit and the status of his chronic medical issues please refer to the assessment and plan.   Past Medical History:  Diagnosis Date  . CVA (cerebral vascular accident) ([place])   . Heart failure ([place])   . Hypertension   . Status post CVA   . T2DM (type 2 diabetes mellitus) ([place])    Review of Systems:   No chest pain or shortness of breath  Physical Exam:  Vitals:   08/21/17 1207  BP: (!) 97/54  Pulse: (!) 102  Temp: 98 F (36.7 C)  TempSrc: Oral  SpO2: 100%  Weight: 216 lb 9.6 oz (98.2 kg)  Height: 6\' 2"  (1.88 m)   GENERAL- alert, co-operative, appears as stated age, not in any distress. HEENT- Atraumatic, normocephalic CARDIAC- mild tachycardia, regular rhythm, no murmurs, rubs or gallops. No JVD appreciated. RESP- Clear to auscultation bilaterally, no wheezes or crackles. ABDOMEN- Soft, nontender, bowel sounds present. EXTREMITIES- pulse 2+, symmetric, trace edema to mid shin bilaterally SKIN- Warm, dry, No rash or lesion. PSYCH- Normal mood and affect, appropriate thought content and speech.   Assessment & Plan:   See Encounters Tab for problem based charting.  Patient discussed with Dr. Evette Doffing

## 2017-08-21 NOTE — Addendum Note (Signed)
Addended by: [place] Lions on: 08/21/2017 02:16 PM   Modules accepted: Orders

## 2017-08-21 NOTE — Assessment & Plan Note (Addendum)
Mr. Tommy Mclaughlin establish care at [facility] 07/30/2017 after being seen in the inpatient side from 5/6/205/6/19 to 07/25/2017 with hypertensive emergency and found to have heart failure with reduced ejection fracture. His 2D echo 07/23/2017 showed an EF of 30 to 35%, diffuse hypokinesis, LVH, and diffuse hypokinesis.  He was most recently seen in clinic on 08/14/2017 and had continued symptoms of orthopnea.  His functional capacity has been limited by knee pain but he denied any dyspnea with exertion at that time.  His Lasix dose was increased from 40 mg daily to 40 mg twice daily and returns for follow-up today.  He is currently prescribed losartan 50 mg daily, carvedilol 12.5 mg twice daily, Lasix 40 mg twice daily. His weight has continued to improve from 235 lbs during hospitalization down to 216 lbs today.  He reports his weights at home been around 219 lbs stable.  He reports his office orthopnea is resolved.  Denies any dyspnea with exertion.  Blood pressures are soft today at 97/54 with his pulse at 102.  He reports feeling a little lightheaded when they checked his blood pressure today but otherwise has not had any episodes of dizziness or lightheadedness.  He has not had an ischemic evaluation at this time and has been referred to cardiology to have this done.  Orthostatics today are positive.  A/P  Given his low blood pressure will decrease his diuretic dosing to Lasix 40 mg daily AS we may have over diuresed him.  We will continue his other medications.  We will recheck BMP today.  We will have patient follow-up in a few weeks for recheck.

## 2017-08-22 ENCOUNTER — Telehealth: Payer: Self-pay | Admitting: *Deleted

## 2017-08-22 ENCOUNTER — Ambulatory Visit: Payer: Medicare Other

## 2017-08-22 ENCOUNTER — Other Ambulatory Visit: Payer: Self-pay | Admitting: Internal Medicine

## 2017-08-22 DIAGNOSIS — I69354 Hemiplegia and hemiparesis following cerebral infarction affecting left non-dominant side: Secondary | ICD-10-CM | POA: Diagnosis not present

## 2017-08-22 DIAGNOSIS — I110 Hypertensive heart disease with heart failure: Secondary | ICD-10-CM | POA: Diagnosis not present

## 2017-08-22 DIAGNOSIS — E119 Type 2 diabetes mellitus without complications: Secondary | ICD-10-CM | POA: Diagnosis not present

## 2017-08-22 DIAGNOSIS — I161 Hypertensive emergency: Secondary | ICD-10-CM | POA: Diagnosis not present

## 2017-08-22 DIAGNOSIS — I5021 Acute systolic (congestive) heart failure: Secondary | ICD-10-CM | POA: Diagnosis not present

## 2017-08-22 DIAGNOSIS — F17210 Nicotine dependence, cigarettes, uncomplicated: Secondary | ICD-10-CM | POA: Diagnosis not present

## 2017-08-22 LAB — BMP8+ANION GAP
Anion Gap: 17 mmol/L (ref 10.0–18.0)
BUN/Creatinine Ratio: 16 (ref 9–20)
BUN: 36 mg/dL — AB (ref 6–24)
CALCIUM: 8.9 mg/dL (ref 8.7–10.2)
CHLORIDE: 104 mmol/L (ref 96–106)
CO2: 22 mmol/L (ref 20–29)
Creatinine, Ser: 2.28 mg/dL — ABNORMAL HIGH (ref 0.76–1.27)
GFR calc non Af Amer: 32 mL/min/{1.73_m2} — ABNORMAL LOW (ref >59)
GFR, EST AFRICAN AMERICAN: 37 mL/min/{1.73_m2} — AB (ref >59)
GLUCOSE: 144 mg/dL — AB (ref 65–99)
Potassium: 4.1 mmol/L (ref 3.5–5.2)
Sodium: 143 mmol/L (ref 134–144)

## 2017-08-22 MED FILL — ATORVASTATIN 40 MG TABLET: 40 | 30 days supply | Qty: 30 | Fill #1

## 2017-08-22 MED FILL — traMADol HCL 50 MG TABS: 50 | 7 days supply | Qty: 14 | Fill #0

## 2017-08-22 NOTE — Telephone Encounter (Signed)
refilled 

## 2017-08-22 NOTE — Progress Notes (Signed)
Internal Medicine Clinic Attending  Case discussed with Dr. Charlynn Grimes  at the time of the visit.  We reviewed the resident's history and exam and pertinent patient test results.  I agree with the assessment, diagnosis, and plan of care documented in the resident's note.  I looked at his knee xrays which have preserved space with minimal osteoarthritis changes. Renal function is a little worse consistent with overdiuresis, we will recheck at his follow up in 2 weeks after backing off on lasix.

## 2017-08-22 NOTE — Telephone Encounter (Signed)
Continued HHN checks are fine. We should see him again in [facility] towards end of next week, we will need to check on his renal function then.

## 2017-08-22 NOTE — Telephone Encounter (Signed)
HH PT calls and states when he arrived at pt home bp 170/100 manual, pt checked on electronic 167/98, waited 15 mins electronic bp 171/101. Weight today 193.2 down from 216 a few days ago. Pt states he feels "good". Denies short of breath, chest pain, h/a, N&V. No new edema noted, shall we have him come to [facility] or have HHN check back with him sat? Please advise

## 2017-08-22 NOTE — Telephone Encounter (Signed)
I also do not see a cardiology referral yet, if we could ensure he gets this referral for ischemia workup and to establish care.  Thank you

## 2017-08-23 MED FILL — GABAPENTIN 300 MG CAPSULE: 300 | 30 days supply | Qty: 30 | Fill #0
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
## 2017-08-26 ENCOUNTER — Telehealth: Payer: Self-pay | Admitting: *Deleted

## 2017-08-26 DIAGNOSIS — E119 Type 2 diabetes mellitus without complications: Secondary | ICD-10-CM | POA: Diagnosis not present

## 2017-08-26 DIAGNOSIS — I5021 Acute systolic (congestive) heart failure: Secondary | ICD-10-CM | POA: Diagnosis not present

## 2017-08-26 DIAGNOSIS — I110 Hypertensive heart disease with heart failure: Secondary | ICD-10-CM | POA: Diagnosis not present

## 2017-08-26 DIAGNOSIS — R0989 Other specified symptoms and signs involving the circulatory and respiratory systems: Secondary | ICD-10-CM

## 2017-08-26 DIAGNOSIS — I161 Hypertensive emergency: Secondary | ICD-10-CM | POA: Diagnosis not present

## 2017-08-26 DIAGNOSIS — I739 Peripheral vascular disease, unspecified: Secondary | ICD-10-CM

## 2017-08-26 DIAGNOSIS — I69354 Hemiplegia and hemiparesis following cerebral infarction affecting left non-dominant side: Secondary | ICD-10-CM | POA: Diagnosis not present

## 2017-08-26 DIAGNOSIS — F17210 Nicotine dependence, cigarettes, uncomplicated: Secondary | ICD-10-CM | POA: Diagnosis not present

## 2017-08-26 NOTE — Telephone Encounter (Signed)
Faxed doppler orders to [facility] lab and referral to [facility] main fax.

## 2017-08-26 NOTE — Telephone Encounter (Signed)
-----   Message from Trula Slade, DPM sent at 08/23/2017  6:58 AM EDT ----- Can you please order an arterial duplex and consult with Dr. Gwenlyn Found for PAD? Thanks.

## 2017-08-27 ENCOUNTER — Ambulatory Visit: Payer: Medicare Other | Admitting: Cardiology

## 2017-08-27 ENCOUNTER — Other Ambulatory Visit: Payer: Self-pay

## 2017-08-27 ENCOUNTER — Encounter (INDEPENDENT_AMBULATORY_CARE_PROVIDER_SITE_OTHER): Payer: Self-pay

## 2017-08-27 ENCOUNTER — Ambulatory Visit (INDEPENDENT_AMBULATORY_CARE_PROVIDER_SITE_OTHER): Payer: Medicare Other | Admitting: Internal Medicine

## 2017-08-27 VITALS — BP 172/96 | HR 91 | Temp 97.8°F | Ht 74.0 in | Wt 218.1 lb

## 2017-08-27 DIAGNOSIS — I5020 Unspecified systolic (congestive) heart failure: Secondary | ICD-10-CM

## 2017-08-27 DIAGNOSIS — Z79899 Other long term (current) drug therapy: Secondary | ICD-10-CM

## 2017-08-27 DIAGNOSIS — I5022 Chronic systolic (congestive) heart failure: Secondary | ICD-10-CM

## 2017-08-27 MED ORDER — CARVEDILOL 25 MG PO TABS: 25.0000 mg | ORAL_TABLET | Freq: Two times a day (BID) | ORAL | 2 refills | Status: DC

## 2017-08-27 NOTE — Patient Instructions (Addendum)
Tommy Mclaughlin,  I am going to increase the dose of your Coreg to 25 mg twice a day. Please follow up with the heart doctors in [place] this afternoon.  Please schedule follow up with your PCP in July.

## 2017-08-27 NOTE — Progress Notes (Signed)
   CC: HFrEF follow up  HPI:  Mr.Antwaun Funari is a 52 y.o. male with a past medical history listed below here today for follow up of his HFrEF.  For details of today's visit and the status of his chronic medical issues please refer to the assessment and plan.   Past Medical History:  Diagnosis Date  . CVA (cerebral vascular accident) ([place])   . Heart failure ([place])   . Hypertension   . Status post CVA   . T2DM (type 2 diabetes mellitus) ([place])    Review of Systems:   No chest pain or shortness of breath  Physical Exam:  Vitals:   08/27/17 1048  BP: (!) 172/96  Pulse: 91  Temp: 97.8 F (36.6 C)  TempSrc: Oral  SpO2: 100%  Weight: 218 lb 1.6 oz (98.9 kg)  Height: 6\' 2"  (1.88 m)   GENERAL- alert, co-operative, appears as stated age, not in any distress. HEENT- Atraumatic, normocephalic CARDIAC- RRR, no murmurs, rubs or gallops. No JVD. RESP- Moving equal volumes of air, and clear to auscultation bilaterally, no wheezes or crackles. EXTREMITIES- pulse 2+, symmetric,trace pedal edema. SKIN- Warm, dry, No rash or lesion. PSYCH- Normal mood and affect, appropriate thought content and speech.   Assessment & Plan:   See Encounters Tab for problem based charting.  Patient discussed with Dr. Rebeca Alert

## 2017-08-27 NOTE — Assessment & Plan Note (Addendum)
Patient here for follow-up on his heart failure with reduced ejection fraction.  His Lasix were decreased from 40 mg twice daily to 10 mg daily at last visit on 08/21/2017.  Orthostatics were positive at that visit and he appeared to have been over diuresed.  BMP at that time showed an AKI further supporting overdiuresis.  Today, his weight is up 2 pounds from previous visit 216 > 218 pounds.  He reports his weight is 218 at home as well.  He denies any shortness of breath, orthopnea, PND.  Orthostatics today show that he is grossly hypertensive at 462 systolic with lying flat but decreases to 194 systolic after standing.  Pulse is consistent around 90 beats a minute.  Patient was asymptomatic.  Assessment and plan: It is unclear why he has such dramatic hypertension lying flat.  Blood pressure appears to be at goal with standing.  I am cautious to increase any of his blood pressure regimen as I do not want to make him symptomatic.  I will increase his Coreg to 25 mg twice daily to help with the better control his heart rate.  Scheduled follow-up with cardiology for further ischemic evaluation in July.    We will recheck a BMP today.

## 2017-08-28 ENCOUNTER — Ambulatory Visit: Payer: Medicare Other

## 2017-08-28 DIAGNOSIS — I5021 Acute systolic (congestive) heart failure: Secondary | ICD-10-CM | POA: Diagnosis not present

## 2017-08-28 DIAGNOSIS — I69354 Hemiplegia and hemiparesis following cerebral infarction affecting left non-dominant side: Secondary | ICD-10-CM | POA: Diagnosis not present

## 2017-08-28 DIAGNOSIS — I110 Hypertensive heart disease with heart failure: Secondary | ICD-10-CM | POA: Diagnosis not present

## 2017-08-28 DIAGNOSIS — I161 Hypertensive emergency: Secondary | ICD-10-CM | POA: Diagnosis not present

## 2017-08-28 DIAGNOSIS — E119 Type 2 diabetes mellitus without complications: Secondary | ICD-10-CM | POA: Diagnosis not present

## 2017-08-28 DIAGNOSIS — F17210 Nicotine dependence, cigarettes, uncomplicated: Secondary | ICD-10-CM | POA: Diagnosis not present

## 2017-08-28 LAB — BMP8+ANION GAP
Anion Gap: 16 mmol/L (ref 10.0–18.0)
BUN/Creatinine Ratio: 17 (ref 9–20)
BUN: 32 mg/dL — ABNORMAL HIGH (ref 6–24)
CO2: 25 mmol/L (ref 20–29)
Calcium: 8.8 mg/dL (ref 8.7–10.2)
Chloride: 100 mmol/L (ref 96–106)
Creatinine, Ser: 1.89 mg/dL — ABNORMAL HIGH (ref 0.76–1.27)
GFR calc Af Amer: 46 mL/min/{1.73_m2} — ABNORMAL LOW (ref >59)
GFR calc non Af Amer: 40 mL/min/{1.73_m2} — ABNORMAL LOW (ref >59)
Glucose: 202 mg/dL — ABNORMAL HIGH (ref 65–99)
Potassium: 4.2 mmol/L (ref 3.5–5.2)
Sodium: 141 mmol/L (ref 134–144)

## 2017-08-28 NOTE — Telephone Encounter (Signed)
This patient is scheduled to see Cardiology on 09/24/2017 @ 11:45am.

## 2017-08-30 ENCOUNTER — Telehealth: Payer: Self-pay

## 2017-08-30 DIAGNOSIS — F17210 Nicotine dependence, cigarettes, uncomplicated: Secondary | ICD-10-CM | POA: Diagnosis not present

## 2017-08-30 DIAGNOSIS — I5021 Acute systolic (congestive) heart failure: Secondary | ICD-10-CM | POA: Diagnosis not present

## 2017-08-30 DIAGNOSIS — I110 Hypertensive heart disease with heart failure: Secondary | ICD-10-CM | POA: Diagnosis not present

## 2017-08-30 DIAGNOSIS — I161 Hypertensive emergency: Secondary | ICD-10-CM | POA: Diagnosis not present

## 2017-08-30 DIAGNOSIS — I69354 Hemiplegia and hemiparesis following cerebral infarction affecting left non-dominant side: Secondary | ICD-10-CM | POA: Diagnosis not present

## 2017-08-30 DIAGNOSIS — E119 Type 2 diabetes mellitus without complications: Secondary | ICD-10-CM | POA: Diagnosis not present

## 2017-08-30 NOTE — Telephone Encounter (Signed)
Filled out necessary paperwork.

## 2017-08-30 NOTE — Telephone Encounter (Signed)
Trent with [facility] want to informed the office, pt fell yesterday and had a small cut on the right knee. Requesting VO for OT. Please call back.

## 2017-08-30 NOTE — Telephone Encounter (Signed)
Wonderful. Thank you.

## 2017-08-30 NOTE — Telephone Encounter (Signed)
Tommy Mclaughlin calls for VO for OT to work on Engineer, materials, ADLS, [facility] for medication and disease management, csw for community programs and financial needs, VO given, do you agree? Also trent states orders need to be signed ASAP that have been sent and seem to be taking longer than desired to be signed, could attending ask HME about signing these? Tommy Mclaughlin states he was told dr Shan Levans is away and will not be back for awhile, I am not receiving out of office when sending items to dr Shan Levans

## 2017-08-30 NOTE — Telephone Encounter (Signed)
Tommy Mclaughlin is here, I saw him today.   Please call and ask him to come down to sign paperwork.  If he is unable to do so by 4pm, one of the attendings will gladly sign.  Is it possible the paperwork is in an attending box to cosign?  Thanks!

## 2017-08-30 NOTE — Progress Notes (Signed)
Internal Medicine Clinic Attending  Case discussed with Dr. Charlynn Grimes  at the time of the visit.  We reviewed the resident's history and exam and pertinent patient test results.  I agree with the assessment, diagnosis, and plan of care documented in the resident's note.  Difficult to treat his hypertension with such severe drop in BP with standing. He is on multiple BP meds that could be contributing, could also consider dysautonomia, but difficult to assess in the context of his antihypertensives. At the same time, his heart failure outcomes will be better with improved BP control. Agree with increasing his BB for now as he does not have any orthostatic symptoms, but difficult to balance harms and benefits of treatments right now.   Oda Kilts, MD

## 2017-09-03 NOTE — Addendum Note (Signed)
Addended by: Hulan Fray on: 09/03/2017 07:37 PM   Modules accepted: Orders

## 2017-09-04 ENCOUNTER — Telehealth: Payer: Self-pay | Admitting: *Deleted

## 2017-09-04 ENCOUNTER — Telehealth: Payer: Self-pay | Admitting: Cardiovascular Disease

## 2017-09-04 DIAGNOSIS — F17210 Nicotine dependence, cigarettes, uncomplicated: Secondary | ICD-10-CM | POA: Diagnosis not present

## 2017-09-04 DIAGNOSIS — I69354 Hemiplegia and hemiparesis following cerebral infarction affecting left non-dominant side: Secondary | ICD-10-CM | POA: Diagnosis not present

## 2017-09-04 DIAGNOSIS — E119 Type 2 diabetes mellitus without complications: Secondary | ICD-10-CM | POA: Diagnosis not present

## 2017-09-04 DIAGNOSIS — I110 Hypertensive heart disease with heart failure: Secondary | ICD-10-CM | POA: Diagnosis not present

## 2017-09-04 DIAGNOSIS — I5021 Acute systolic (congestive) heart failure: Secondary | ICD-10-CM | POA: Diagnosis not present

## 2017-09-04 DIAGNOSIS — I161 Hypertensive emergency: Secondary | ICD-10-CM | POA: Diagnosis not present

## 2017-09-04 NOTE — Telephone Encounter (Signed)
Apolonio Schneiders, nurse, called in from [place] to state that the patient has been having problems regulating his blood pressure. Today it was 168/98 and 172/102. He has a new patient appointment on 09/24/17 with Dr. Gwenlyn Found. He has a lower extremity scheduled for 6/21.  The nurse would like for the patient to be seen earlier. He was offered an appointment today but declined. She has been advised that the patient will need to call the prescriber of the blood pressure medication in the mean time.  Message will be sent to the provider's assistant for scheduling options.

## 2017-09-04 NOTE — Telephone Encounter (Signed)
HHN calls and states that pt's BP this am is 168/98 and then appr 20 mins later 172/ 101. Not unusual for elevated BP but this am c/o dizziness, tingling in L arm and "just not feeling good" She is advised to direct pt to urg care/ ED, there are no available [facility] appts. She will send a fax of monitored BP from each day

## 2017-09-04 NOTE — Telephone Encounter (Signed)
Noted & I agree w rec Dr Darnell Level

## 2017-09-05 NOTE — Telephone Encounter (Signed)
Spoke with pt, appointment moved to 09-20-17.

## 2017-09-06 ENCOUNTER — Ambulatory Visit (HOSPITAL_COMMUNITY)
Admission: RE | Admit: 2017-09-06 | Discharge: 2017-09-06 | Disposition: A | Discharge status: Home / Self Care | Payer: Medicare Other | Source: Ambulatory Visit | Attending: Cardiovascular Disease | Admitting: Cardiovascular Disease

## 2017-09-06 ENCOUNTER — Telehealth: Payer: Self-pay | Admitting: Internal Medicine

## 2017-09-06 DIAGNOSIS — I5021 Acute systolic (congestive) heart failure: Secondary | ICD-10-CM | POA: Diagnosis not present

## 2017-09-06 DIAGNOSIS — M79651 Pain in right thigh: Secondary | ICD-10-CM | POA: Insufficient documentation

## 2017-09-06 DIAGNOSIS — E119 Type 2 diabetes mellitus without complications: Secondary | ICD-10-CM | POA: Diagnosis not present

## 2017-09-06 DIAGNOSIS — R0989 Other specified symptoms and signs involving the circulatory and respiratory systems: Secondary | ICD-10-CM

## 2017-09-06 DIAGNOSIS — M79652 Pain in left thigh: Secondary | ICD-10-CM | POA: Diagnosis not present

## 2017-09-06 DIAGNOSIS — I10 Essential (primary) hypertension: Secondary | ICD-10-CM | POA: Diagnosis not present

## 2017-09-06 DIAGNOSIS — Z87891 Personal history of nicotine dependence: Secondary | ICD-10-CM | POA: Diagnosis not present

## 2017-09-06 DIAGNOSIS — Z8673 Personal history of transient ischemic attack (TIA), and cerebral infarction without residual deficits: Secondary | ICD-10-CM | POA: Diagnosis not present

## 2017-09-06 DIAGNOSIS — F17210 Nicotine dependence, cigarettes, uncomplicated: Secondary | ICD-10-CM | POA: Diagnosis not present

## 2017-09-06 DIAGNOSIS — I161 Hypertensive emergency: Secondary | ICD-10-CM | POA: Diagnosis not present

## 2017-09-06 DIAGNOSIS — I70203 Unspecified atherosclerosis of native arteries of extremities, bilateral legs: Secondary | ICD-10-CM | POA: Insufficient documentation

## 2017-09-06 DIAGNOSIS — I110 Hypertensive heart disease with heart failure: Secondary | ICD-10-CM | POA: Diagnosis not present

## 2017-09-06 DIAGNOSIS — I739 Peripheral vascular disease, unspecified: Secondary | ICD-10-CM | POA: Diagnosis not present

## 2017-09-06 DIAGNOSIS — I69354 Hemiplegia and hemiparesis following cerebral infarction affecting left non-dominant side: Secondary | ICD-10-CM | POA: Diagnosis not present

## 2017-09-06 NOTE — Telephone Encounter (Signed)
Called pt to check in, had a LE arterial doppler suggestive of some blockage today per the patient, formal results to follow.  He has an appointment with Dr. Gwenlyn Found on the 5th of July to discuss possible procedure to open these blockages. He does tell me he has had to increase back to 40mg  lasix twice daily to keep his weight stable, felt he had a drop off in urine output. He is at 219lbs today.  His bp today is 135/70.  He feels well overall. I asked that he come to the clinic next week so we could check his labs, weight and blood pressure as he has increased his lasix dose.

## 2017-09-11 ENCOUNTER — Inpatient Hospital Stay (HOSPITAL_COMMUNITY): Payer: Medicare Other

## 2017-09-11 ENCOUNTER — Other Ambulatory Visit: Payer: Self-pay

## 2017-09-11 ENCOUNTER — Emergency Department (HOSPITAL_COMMUNITY): Payer: Medicare Other

## 2017-09-11 ENCOUNTER — Telehealth: Payer: Self-pay | Admitting: *Deleted

## 2017-09-11 ENCOUNTER — Encounter (HOSPITAL_COMMUNITY)
Admission: EM | Disposition: A | Discharge status: Skilled Nursing Facility | Payer: Self-pay | Source: Home / Self Care | Attending: Cardiology

## 2017-09-11 ENCOUNTER — Encounter (HOSPITAL_COMMUNITY): Payer: Self-pay

## 2017-09-11 ENCOUNTER — Inpatient Hospital Stay (HOSPITAL_COMMUNITY)
Admission: EM | Admit: 2017-09-11 | Discharge: 2017-09-25 | DRG: 270 | Disposition: A | Discharge status: Skilled Nursing Facility | Payer: Medicare Other | Attending: Cardiology | Admitting: Cardiology

## 2017-09-11 ENCOUNTER — Encounter: Payer: Medicare Other | Admitting: Internal Medicine

## 2017-09-11 DIAGNOSIS — E1121 Type 2 diabetes mellitus with diabetic nephropathy: Secondary | ICD-10-CM | POA: Diagnosis present

## 2017-09-11 DIAGNOSIS — R278 Other lack of coordination: Secondary | ICD-10-CM | POA: Diagnosis not present

## 2017-09-11 DIAGNOSIS — I2510 Atherosclerotic heart disease of native coronary artery without angina pectoris: Secondary | ICD-10-CM | POA: Diagnosis not present

## 2017-09-11 DIAGNOSIS — N320 Bladder-neck obstruction: Secondary | ICD-10-CM | POA: Diagnosis not present

## 2017-09-11 DIAGNOSIS — D509 Iron deficiency anemia, unspecified: Secondary | ICD-10-CM | POA: Diagnosis not present

## 2017-09-11 DIAGNOSIS — T508X5A Adverse effect of diagnostic agents, initial encounter: Secondary | ICD-10-CM | POA: Diagnosis not present

## 2017-09-11 DIAGNOSIS — Z9861 Coronary angioplasty status: Secondary | ICD-10-CM | POA: Diagnosis not present

## 2017-09-11 DIAGNOSIS — R339 Retention of urine, unspecified: Secondary | ICD-10-CM | POA: Diagnosis not present

## 2017-09-11 DIAGNOSIS — E876 Hypokalemia: Secondary | ICD-10-CM | POA: Diagnosis not present

## 2017-09-11 DIAGNOSIS — Z79891 Long term (current) use of opiate analgesic: Secondary | ICD-10-CM

## 2017-09-11 DIAGNOSIS — I213 ST elevation (STEMI) myocardial infarction of unspecified site: Secondary | ICD-10-CM

## 2017-09-11 DIAGNOSIS — Z7401 Bed confinement status: Secondary | ICD-10-CM | POA: Diagnosis not present

## 2017-09-11 DIAGNOSIS — I469 Cardiac arrest, cause unspecified: Secondary | ICD-10-CM

## 2017-09-11 DIAGNOSIS — E1122 Type 2 diabetes mellitus with diabetic chronic kidney disease: Secondary | ICD-10-CM | POA: Diagnosis present

## 2017-09-11 DIAGNOSIS — I739 Peripheral vascular disease, unspecified: Secondary | ICD-10-CM | POA: Diagnosis not present

## 2017-09-11 DIAGNOSIS — I639 Cerebral infarction, unspecified: Secondary | ICD-10-CM | POA: Diagnosis not present

## 2017-09-11 DIAGNOSIS — I130 Hypertensive heart and chronic kidney disease with heart failure and stage 1 through stage 4 chronic kidney disease, or unspecified chronic kidney disease: Secondary | ICD-10-CM | POA: Diagnosis present

## 2017-09-11 DIAGNOSIS — D72829 Elevated white blood cell count, unspecified: Secondary | ICD-10-CM | POA: Diagnosis not present

## 2017-09-11 DIAGNOSIS — I509 Heart failure, unspecified: Secondary | ICD-10-CM | POA: Diagnosis not present

## 2017-09-11 DIAGNOSIS — Z794 Long term (current) use of insulin: Secondary | ICD-10-CM

## 2017-09-11 DIAGNOSIS — K5900 Constipation, unspecified: Secondary | ICD-10-CM | POA: Diagnosis present

## 2017-09-11 DIAGNOSIS — Z720 Tobacco use: Secondary | ICD-10-CM | POA: Diagnosis not present

## 2017-09-11 DIAGNOSIS — D631 Anemia in chronic kidney disease: Secondary | ICD-10-CM | POA: Diagnosis not present

## 2017-09-11 DIAGNOSIS — M2550 Pain in unspecified joint: Secondary | ICD-10-CM | POA: Diagnosis not present

## 2017-09-11 DIAGNOSIS — I462 Cardiac arrest due to underlying cardiac condition: Secondary | ICD-10-CM | POA: Diagnosis not present

## 2017-09-11 DIAGNOSIS — F17210 Nicotine dependence, cigarettes, uncomplicated: Secondary | ICD-10-CM | POA: Diagnosis not present

## 2017-09-11 DIAGNOSIS — I110 Hypertensive heart disease with heart failure: Secondary | ICD-10-CM | POA: Diagnosis not present

## 2017-09-11 DIAGNOSIS — Z91018 Allergy to other foods: Secondary | ICD-10-CM | POA: Diagnosis not present

## 2017-09-11 DIAGNOSIS — Z79899 Other long term (current) drug therapy: Secondary | ICD-10-CM

## 2017-09-11 DIAGNOSIS — I2129 ST elevation (STEMI) myocardial infarction involving other sites: Secondary | ICD-10-CM | POA: Diagnosis not present

## 2017-09-11 DIAGNOSIS — I10 Essential (primary) hypertension: Secondary | ICD-10-CM | POA: Diagnosis not present

## 2017-09-11 DIAGNOSIS — E8729 Other acidosis: Secondary | ICD-10-CM | POA: Diagnosis not present

## 2017-09-11 DIAGNOSIS — I5023 Acute on chronic systolic (congestive) heart failure: Secondary | ICD-10-CM | POA: Diagnosis present

## 2017-09-11 DIAGNOSIS — Z515 Encounter for palliative care: Secondary | ICD-10-CM

## 2017-09-11 DIAGNOSIS — I5020 Unspecified systolic (congestive) heart failure: Secondary | ICD-10-CM | POA: Diagnosis not present

## 2017-09-11 DIAGNOSIS — E1151 Type 2 diabetes mellitus with diabetic peripheral angiopathy without gangrene: Secondary | ICD-10-CM | POA: Diagnosis present

## 2017-09-11 DIAGNOSIS — I161 Hypertensive emergency: Secondary | ICD-10-CM | POA: Diagnosis not present

## 2017-09-11 DIAGNOSIS — N189 Chronic kidney disease, unspecified: Secondary | ICD-10-CM | POA: Diagnosis not present

## 2017-09-11 DIAGNOSIS — I25119 Atherosclerotic heart disease of native coronary artery with unspecified angina pectoris: Secondary | ICD-10-CM | POA: Diagnosis not present

## 2017-09-11 DIAGNOSIS — I5021 Acute systolic (congestive) heart failure: Secondary | ICD-10-CM | POA: Diagnosis not present

## 2017-09-11 DIAGNOSIS — I25110 Atherosclerotic heart disease of native coronary artery with unstable angina pectoris: Secondary | ICD-10-CM | POA: Diagnosis not present

## 2017-09-11 DIAGNOSIS — Z4789 Encounter for other orthopedic aftercare: Secondary | ICD-10-CM | POA: Diagnosis not present

## 2017-09-11 DIAGNOSIS — R52 Pain, unspecified: Secondary | ICD-10-CM | POA: Diagnosis not present

## 2017-09-11 DIAGNOSIS — I5043 Acute on chronic combined systolic (congestive) and diastolic (congestive) heart failure: Secondary | ICD-10-CM | POA: Diagnosis present

## 2017-09-11 DIAGNOSIS — I69354 Hemiplegia and hemiparesis following cerebral infarction affecting left non-dominant side: Secondary | ICD-10-CM | POA: Diagnosis not present

## 2017-09-11 DIAGNOSIS — R531 Weakness: Secondary | ICD-10-CM | POA: Diagnosis not present

## 2017-09-11 DIAGNOSIS — I2119 ST elevation (STEMI) myocardial infarction involving other coronary artery of inferior wall: Principal | ICD-10-CM | POA: Diagnosis present

## 2017-09-11 DIAGNOSIS — R2689 Other abnormalities of gait and mobility: Secondary | ICD-10-CM | POA: Diagnosis not present

## 2017-09-11 DIAGNOSIS — N184 Chronic kidney disease, stage 4 (severe): Secondary | ICD-10-CM | POA: Diagnosis present

## 2017-09-11 DIAGNOSIS — W182XXA Fall in (into) shower or empty bathtub, initial encounter: Secondary | ICD-10-CM | POA: Diagnosis present

## 2017-09-11 DIAGNOSIS — Z8249 Family history of ischemic heart disease and other diseases of the circulatory system: Secondary | ICD-10-CM | POA: Diagnosis not present

## 2017-09-11 DIAGNOSIS — M1990 Unspecified osteoarthritis, unspecified site: Secondary | ICD-10-CM | POA: Diagnosis not present

## 2017-09-11 DIAGNOSIS — F17290 Nicotine dependence, other tobacco product, uncomplicated: Secondary | ICD-10-CM | POA: Diagnosis not present

## 2017-09-11 DIAGNOSIS — I255 Ischemic cardiomyopathy: Secondary | ICD-10-CM | POA: Diagnosis present

## 2017-09-11 DIAGNOSIS — J811 Chronic pulmonary edema: Secondary | ICD-10-CM | POA: Diagnosis not present

## 2017-09-11 DIAGNOSIS — R0602 Shortness of breath: Secondary | ICD-10-CM | POA: Diagnosis not present

## 2017-09-11 DIAGNOSIS — M6281 Muscle weakness (generalized): Secondary | ICD-10-CM | POA: Diagnosis not present

## 2017-09-11 DIAGNOSIS — E872 Acidosis: Secondary | ICD-10-CM | POA: Diagnosis not present

## 2017-09-11 DIAGNOSIS — N179 Acute kidney failure, unspecified: Secondary | ICD-10-CM | POA: Diagnosis present

## 2017-09-11 DIAGNOSIS — R570 Cardiogenic shock: Secondary | ICD-10-CM | POA: Diagnosis not present

## 2017-09-11 DIAGNOSIS — Z833 Family history of diabetes mellitus: Secondary | ICD-10-CM | POA: Diagnosis not present

## 2017-09-11 DIAGNOSIS — E119 Type 2 diabetes mellitus without complications: Secondary | ICD-10-CM | POA: Diagnosis not present

## 2017-09-11 DIAGNOSIS — I480 Paroxysmal atrial fibrillation: Secondary | ICD-10-CM | POA: Diagnosis not present

## 2017-09-11 DIAGNOSIS — N183 Chronic kidney disease, stage 3 (moderate): Secondary | ICD-10-CM | POA: Diagnosis not present

## 2017-09-11 DIAGNOSIS — I472 Ventricular tachycardia: Secondary | ICD-10-CM | POA: Diagnosis not present

## 2017-09-11 DIAGNOSIS — I4729 Other ventricular tachycardia: Secondary | ICD-10-CM

## 2017-09-11 DIAGNOSIS — K219 Gastro-esophageal reflux disease without esophagitis: Secondary | ICD-10-CM | POA: Diagnosis not present

## 2017-09-11 DIAGNOSIS — J9601 Acute respiratory failure with hypoxia: Secondary | ICD-10-CM | POA: Diagnosis not present

## 2017-09-11 DIAGNOSIS — D649 Anemia, unspecified: Secondary | ICD-10-CM | POA: Diagnosis not present

## 2017-09-11 DIAGNOSIS — Z955 Presence of coronary angioplasty implant and graft: Secondary | ICD-10-CM

## 2017-09-11 HISTORY — DX: Heart failure, unspecified: I50.9

## 2017-09-11 HISTORY — PX: LEFT HEART CATH AND CORONARY ANGIOGRAPHY: CATH118249

## 2017-09-11 HISTORY — PX: CORONARY/GRAFT ACUTE MI REVASCULARIZATION: CATH118305

## 2017-09-11 LAB — BASIC METABOLIC PANEL
ANION GAP: 11 (ref 5–15)
BUN: 30 mg/dL — ABNORMAL HIGH (ref 6–20)
CALCIUM: 8.1 mg/dL — AB (ref 8.9–10.3)
CHLORIDE: 104 mmol/L (ref 98–111)
CO2: 24 mmol/L (ref 22–32)
Creatinine, Ser: 2.65 mg/dL — ABNORMAL HIGH (ref 0.61–1.24)
GFR calc non Af Amer: 26 mL/min — ABNORMAL LOW (ref >60)
GFR, EST AFRICAN AMERICAN: 30 mL/min — AB (ref >60)
GLUCOSE: 160 mg/dL — AB (ref 70–99)
Potassium: 3.2 mmol/L — ABNORMAL LOW (ref 3.5–5.1)
Sodium: 139 mmol/L (ref 135–145)

## 2017-09-11 LAB — CBC WITH DIFFERENTIAL/PLATELET
ABS IMMATURE GRANULOCYTES: 0 10*3/uL (ref 0.0–0.1)
Basophils Absolute: 0 10*3/uL (ref 0.0–0.1)
Basophils Relative: 0 %
Eosinophils Absolute: 0.1 10*3/uL (ref 0.0–0.7)
Eosinophils Relative: 1 %
HEMATOCRIT: 27.5 % — AB (ref 39.0–52.0)
HEMOGLOBIN: 8.4 g/dL — AB (ref 13.0–17.0)
IMMATURE GRANULOCYTES: 0 %
LYMPHS ABS: 2.7 10*3/uL (ref 0.7–4.0)
LYMPHS PCT: 25 %
MCH: 25.5 pg — AB (ref 26.0–34.0)
MCHC: 30.5 g/dL (ref 30.0–36.0)
MCV: 83.6 fL (ref 78.0–100.0)
MONO ABS: 1 10*3/uL (ref 0.1–1.0)
MONOS PCT: 10 %
NEUTROS ABS: 6.7 10*3/uL (ref 1.7–7.7)
NEUTROS PCT: 64 %
PLATELETS: 331 10*3/uL (ref 150–400)
RBC: 3.29 MIL/uL — ABNORMAL LOW (ref 4.22–5.81)
RDW: 14.8 % (ref 11.5–15.5)
WBC: 10.5 10*3/uL (ref 4.0–10.5)

## 2017-09-11 LAB — I-STAT TROPONIN, ED: Troponin i, poc: 8.83 ng/mL (ref 0.00–0.08)

## 2017-09-11 LAB — TROPONIN I: TROPONIN I: 6.24 ng/mL — AB (ref <0.03)

## 2017-09-11 LAB — I-STAT CG4 LACTIC ACID, ED
LACTIC ACID, VENOUS: 1.56 mmol/L (ref 0.5–1.9)
Lactic Acid, Venous: 1.5 mmol/L (ref 0.5–1.9)

## 2017-09-11 LAB — TSH: TSH: 1.12 u[IU]/mL (ref 0.350–4.500)

## 2017-09-11 LAB — MRSA PCR SCREENING: MRSA by PCR: NEGATIVE

## 2017-09-11 LAB — POCT ACTIVATED CLOTTING TIME: ACTIVATED CLOTTING TIME: 362 s

## 2017-09-11 LAB — HEMOGLOBIN A1C
HEMOGLOBIN A1C: 8.8 % — AB (ref 4.8–5.6)
MEAN PLASMA GLUCOSE: 205.86 mg/dL

## 2017-09-11 LAB — PROTIME-INR
INR: 1.12
Prothrombin Time: 14.4 seconds (ref 11.4–15.2)

## 2017-09-11 LAB — BRAIN NATRIURETIC PEPTIDE

## 2017-09-11 LAB — MAGNESIUM: Magnesium: 2 mg/dL (ref 1.7–2.4)

## 2017-09-11 LAB — GLUCOSE, CAPILLARY: Glucose-Capillary: 124 mg/dL — ABNORMAL HIGH (ref 70–99)

## 2017-09-11 SURGERY — LEFT HEART CATH AND CORONARY ANGIOGRAPHY
Anesthesia: LOCAL

## 2017-09-11 MED ORDER — NITROGLYCERIN 1 MG/10 ML FOR IR/CATH LAB
INTRA_ARTERIAL | Status: DC | PRN
  Administered 2017-09-11: 150 ug via INTRACORONARY
  Administered 2017-09-11: 100 ug via INTRACORONARY
  Administered 2017-09-11 (×2): 150 ug via INTRACORONARY

## 2017-09-11 MED ORDER — ASPIRIN EC 81 MG PO TBEC
81.0000 mg | DELAYED_RELEASE_TABLET | Freq: Every day | ORAL | Status: DC
  Administered 2017-09-12 – 2017-09-25 (×14): 81 mg via ORAL
  Filled 2017-09-11 (×14): qty 1

## 2017-09-11 MED ORDER — NITROGLYCERIN 0.4 MG SL SUBL
0.4000 mg | SUBLINGUAL_TABLET | SUBLINGUAL | Status: DC | PRN
  Filled 2017-09-11: qty 1

## 2017-09-11 MED ORDER — FENTANYL CITRATE (PF) 100 MCG/2ML IJ SOLN
50.0000 ug | Freq: Once | INTRAMUSCULAR | Status: AC
  Administered 2017-09-11: 50 ug via INTRAVENOUS
  Filled 2017-09-11: qty 2

## 2017-09-11 MED ORDER — TICAGRELOR 90 MG PO TABS
ORAL_TABLET | ORAL | Status: DC | PRN
  Administered 2017-09-11: 180 mg via ORAL

## 2017-09-11 MED ORDER — ASPIRIN 81 MG PO CHEW: 324.0000 mg | CHEWABLE_TABLET | ORAL | Status: AC

## 2017-09-11 MED ORDER — POTASSIUM CHLORIDE CRYS ER 20 MEQ PO TBCR
40.0000 meq | EXTENDED_RELEASE_TABLET | Freq: Once | ORAL | Status: AC
  Administered 2017-09-11: 40 meq via ORAL
  Filled 2017-09-11: qty 2

## 2017-09-11 MED ORDER — HEPARIN (PORCINE) IN NACL 2-0.9 UNITS/ML
INTRAMUSCULAR | Status: AC | PRN
  Administered 2017-09-11: 1000 mL

## 2017-09-11 MED ORDER — HEPARIN SODIUM (PORCINE) 5000 UNIT/ML IJ SOLN: 60.0000 [IU]/kg | Freq: Once | INTRAMUSCULAR | Status: DC

## 2017-09-11 MED ORDER — SODIUM CHLORIDE 0.9% FLUSH: 3.0000 mL | INTRAVENOUS | Status: DC | PRN

## 2017-09-11 MED ORDER — INSULIN ASPART 100 UNIT/ML ~~LOC~~ SOLN
0.0000 [IU] | Freq: Three times a day (TID) | SUBCUTANEOUS | Status: DC
  Administered 2017-09-12 (×2): 1 [IU] via SUBCUTANEOUS
  Administered 2017-09-12: 2 [IU] via SUBCUTANEOUS
  Administered 2017-09-13: 3 [IU] via SUBCUTANEOUS
  Administered 2017-09-13: 1 [IU] via SUBCUTANEOUS
  Administered 2017-09-14: 2 [IU] via SUBCUTANEOUS
  Administered 2017-09-14 – 2017-09-15 (×2): 1 [IU] via SUBCUTANEOUS
  Administered 2017-09-15 – 2017-09-16 (×4): 2 [IU] via SUBCUTANEOUS

## 2017-09-11 MED ORDER — NITROGLYCERIN IN D5W 200-5 MCG/ML-% IV SOLN
INTRAVENOUS | Status: AC
  Filled 2017-09-11: qty 250

## 2017-09-11 MED ORDER — HEPARIN BOLUS VIA INFUSION
4000.0000 [IU] | Freq: Once | INTRAVENOUS | Status: AC
  Administered 2017-09-11: 4000 [IU] via INTRAVENOUS
  Filled 2017-09-11: qty 4000

## 2017-09-11 MED ORDER — ASPIRIN 300 MG RE SUPP: 300.0000 mg | RECTAL | Status: AC

## 2017-09-11 MED ORDER — PANTOPRAZOLE SODIUM 40 MG PO TBEC
40.0000 mg | DELAYED_RELEASE_TABLET | Freq: Every day | ORAL | Status: DC
  Administered 2017-09-11 – 2017-09-25 (×14): 40 mg via ORAL
  Filled 2017-09-11 (×13): qty 1

## 2017-09-11 MED ORDER — LIDOCAINE HCL (PF) 1 % IJ SOLN
INTRAMUSCULAR | Status: DC | PRN
  Administered 2017-09-11: 2 mL

## 2017-09-11 MED ORDER — BACITRACIN ZINC 500 UNIT/GM EX OINT
TOPICAL_OINTMENT | CUTANEOUS | Status: DC | PRN
  Filled 2017-09-11: qty 28.4

## 2017-09-11 MED ORDER — HEPARIN (PORCINE) IN NACL 1000-0.9 UT/500ML-% IV SOLN
INTRAVENOUS | Status: AC
  Filled 2017-09-11: qty 1000

## 2017-09-11 MED ORDER — LIDOCAINE HCL (PF) 1 % IJ SOLN
INTRAMUSCULAR | Status: AC
  Filled 2017-09-11: qty 30

## 2017-09-11 MED ORDER — IOHEXOL 350 MG/ML SOLN
INTRAVENOUS | Status: DC | PRN
  Administered 2017-09-11: 155 mL via INTRACARDIAC

## 2017-09-11 MED ORDER — BIVALIRUDIN BOLUS VIA INFUSION - CUPID
INTRAVENOUS | Status: DC | PRN
  Administered 2017-09-11: 74.175 mg via INTRAVENOUS

## 2017-09-11 MED ORDER — SODIUM CHLORIDE 0.9 % IV SOLN
250.0000 mL | INTRAVENOUS | Status: DC | PRN
  Administered 2017-09-12 – 2017-09-13 (×2): 250 mL via INTRAVENOUS
  Administered 2017-09-16: 10 mL/h via INTRAVENOUS

## 2017-09-11 MED ORDER — PANTOPRAZOLE SODIUM 40 MG PO TBEC
40.0000 mg | DELAYED_RELEASE_TABLET | Freq: Every day | ORAL | Status: DC
Start: 2017-09-12 — End: 2017-09-11
  Filled 2017-09-11: qty 1

## 2017-09-11 MED ORDER — SODIUM CHLORIDE 0.9% FLUSH
3.0000 mL | Freq: Two times a day (BID) | INTRAVENOUS | Status: DC
  Administered 2017-09-11 – 2017-09-16 (×7): 3 mL via INTRAVENOUS

## 2017-09-11 MED ORDER — ISOSORB DINITRATE-HYDRALAZINE 20-37.5 MG PO TABS
1.0000 | ORAL_TABLET | Freq: Three times a day (TID) | ORAL | Status: DC
  Administered 2017-09-11 – 2017-09-12 (×2): 1 via ORAL
  Filled 2017-09-11 (×4): qty 1

## 2017-09-11 MED ORDER — HEPARIN (PORCINE) IN NACL 100-0.45 UNIT/ML-% IJ SOLN
1300.0000 [IU]/h | INTRAMUSCULAR | Status: DC
  Administered 2017-09-11: 1300 [IU]/h via INTRAVENOUS
  Filled 2017-09-11: qty 250

## 2017-09-11 MED ORDER — BIVALIRUDIN TRIFLUOROACETATE 250 MG IV SOLR
INTRAVENOUS | Status: AC
  Filled 2017-09-11: qty 250

## 2017-09-11 MED ORDER — ATORVASTATIN CALCIUM 80 MG PO TABS
80.0000 mg | ORAL_TABLET | Freq: Every day | ORAL | Status: DC
  Administered 2017-09-11 – 2017-09-24 (×14): 80 mg via ORAL
  Filled 2017-09-11 (×14): qty 1

## 2017-09-11 MED ORDER — ASPIRIN 81 MG PO CHEW
324.0000 mg | CHEWABLE_TABLET | Freq: Once | ORAL | Status: AC
  Administered 2017-09-11: 324 mg via ORAL
  Filled 2017-09-11: qty 4

## 2017-09-11 MED ORDER — ONDANSETRON HCL 4 MG/2ML IJ SOLN
4.0000 mg | Freq: Four times a day (QID) | INTRAMUSCULAR | Status: DC | PRN
  Administered 2017-09-11 – 2017-09-13 (×4): 4 mg via INTRAVENOUS
  Filled 2017-09-11 (×5): qty 2

## 2017-09-11 MED ORDER — NITROGLYCERIN 1 MG/10 ML FOR IR/CATH LAB
INTRA_ARTERIAL | Status: AC
  Filled 2017-09-11: qty 10

## 2017-09-11 MED ORDER — TICAGRELOR 90 MG PO TABS
90.0000 mg | ORAL_TABLET | Freq: Two times a day (BID) | ORAL | Status: DC
  Administered 2017-09-12 – 2017-09-25 (×27): 90 mg via ORAL
  Filled 2017-09-11 (×28): qty 1

## 2017-09-11 MED ORDER — NITROGLYCERIN IN D5W 200-5 MCG/ML-% IV SOLN
INTRAVENOUS | Status: AC | PRN
  Administered 2017-09-11: 5 ug/min via INTRAVENOUS

## 2017-09-11 MED ORDER — SODIUM CHLORIDE 0.9 % IV SOLN
INTRAVENOUS | Status: AC
  Administered 2017-09-11: 21:00:00 via INTRAVENOUS

## 2017-09-11 MED ORDER — CARVEDILOL 6.25 MG PO TABS
6.2500 mg | ORAL_TABLET | Freq: Two times a day (BID) | ORAL | Status: DC
  Administered 2017-09-12 – 2017-09-19 (×15): 6.25 mg via ORAL
  Filled 2017-09-11 (×15): qty 1

## 2017-09-11 MED ORDER — SODIUM CHLORIDE 0.9 % IV SOLN
INTRAVENOUS | Status: AC | PRN
  Administered 2017-09-11: 75 mL/h via INTRAVENOUS

## 2017-09-11 MED ORDER — SODIUM CHLORIDE 0.9 % IV SOLN
INTRAVENOUS | Status: AC | PRN
  Administered 2017-09-11 (×2): 1.75 mg/kg/h via INTRAVENOUS

## 2017-09-11 MED ORDER — ACETAMINOPHEN 325 MG PO TABS
650.0000 mg | ORAL_TABLET | ORAL | Status: DC | PRN
  Administered 2017-09-11 – 2017-09-21 (×22): 650 mg via ORAL
  Filled 2017-09-11 (×22): qty 2

## 2017-09-11 MED ORDER — TICAGRELOR 90 MG PO TABS
ORAL_TABLET | ORAL | Status: AC
  Filled 2017-09-11: qty 2

## 2017-09-11 MED ORDER — NITROGLYCERIN IN D5W 200-5 MCG/ML-% IV SOLN
5.0000 ug/min | INTRAVENOUS | Status: DC
  Administered 2017-09-16: 5 ug/min via INTRAVENOUS
  Filled 2017-09-11: qty 250

## 2017-09-11 SURGICAL SUPPLY — 23 items
BALLN EMERGE MR 1.5X15 (BALLOONS) ×2
BALLN EMERGE MR 2.0X12 (BALLOONS) ×2
BALLN EMERGE MR 2.0X20 (BALLOONS) ×2
BALLN EMERGE MR 2.5X12 (BALLOONS) ×2
BALLOON EMERGE MR 1.5X15 (BALLOONS) ×1 IMPLANT
BALLOON EMERGE MR 2.0X12 (BALLOONS) ×1 IMPLANT
BALLOON EMERGE MR 2.0X20 (BALLOONS) ×1 IMPLANT
BALLOON EMERGE MR 2.5X12 (BALLOONS) ×1 IMPLANT
CATH INFINITI 5FR JL5 (CATHETERS) ×2 IMPLANT
CATH INFINITI 5FR MULTPACK ANG (CATHETERS) ×2 IMPLANT
CATH VISTA GUIDE 6FR XB4 (CATHETERS) ×2 IMPLANT
ELECT DEFIB PAD ADLT CADENCE (PAD) ×2 IMPLANT
HOVERMATT SINGLE USE (MISCELLANEOUS) ×2 IMPLANT
KIT ENCORE 26 ADVANTAGE (KITS) ×2 IMPLANT
KIT HEART LEFT (KITS) ×2 IMPLANT
PACK CARDIAC CATHETERIZATION (CUSTOM PROCEDURE TRAY) ×2 IMPLANT
SHEATH PINNACLE 6F 10CM (SHEATH) ×2 IMPLANT
STENT RESOLUTE ONYX 2.0X22 (Permanent Stent) ×2 IMPLANT
STENT SIERRA 2.25 X 28 MM (Permanent Stent) ×2 IMPLANT
TRANSDUCER W/STOPCOCK (MISCELLANEOUS) ×2 IMPLANT
WIRE EMERALD 3MM-J .035X150CM (WIRE) ×2 IMPLANT
WIRE PT2 MS 185 (WIRE) ×2 IMPLANT
WIRE RUNTHROUGH .014X180CM (WIRE) ×2 IMPLANT
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
## 2017-09-11 NOTE — Telephone Encounter (Signed)
He needs to follow-up with Dr. Gwenlyn Found and it looks like he only has an appointment on 7/5 with him.

## 2017-09-11 NOTE — ED Notes (Signed)
Activated code stemi with carelinkM[facility] Noble per MD Jeneen Rinks

## 2017-09-11 NOTE — Telephone Encounter (Signed)
Laqueta Due, RN with [facility] called from patient's home. States patient had fall this AM 2/2 weakness and pain in LE and groin. Had LE doppler on 09/06/2017 that showed blockages. F/u with vascular not till 09/20/2017. Patient also with 3.5 lb weight gain since 09/04/2017. No openings in [place]. Discussed with Practice Manager and patient placed on continuity provider's schedule at 2:15. Hubbard Hartshorn, RN, BSN

## 2017-09-11 NOTE — ED Notes (Signed)
I-stat troponin result given to Dr. Jeneen Rinks

## 2017-09-11 NOTE — ED Triage Notes (Addendum)
Per [facility]: Pt from home. Has bilateral groin and leg pain for about 1 week. On Friday he was told he had blood clots. Had appointment with internal medicine but couldn't make it due to pain. Pt transferred to wheelchair from stretcher. Pt states that he fell today because of pain. Pt has a large scrape on the top of his right foot and a few scrapes on his knees when he fell this AM.   Both of pts feet are weeping. Cling gauze applied to feet. Pulses found using doppler.

## 2017-09-11 NOTE — ED Provider Notes (Signed)
Patient placed in [facility] pathway, seen and evaluated   Chief Complaint: leg pain and fall  HPI:   Patient states he was dx with BL blood clots,  Claims no meds., states he fell in shower. Blisters on feet  ROS: leg pain  (one)  Physical Exam:   Gen: No distress  Neuro: Awake and Alert  Skin: Warm    Focused Exam: BL leg swelling and knee abrasions. Desquamated Bulla of toe. Pt pulse dopler   Initiation of care has begun. The patient has been counseled on the process, plan, and necessity for staying for the completion/evaluation, and the remainder of the medical screening examination    Margarita Mail, Hershal Coria 09/11/17 2115    Pattricia Boss, MD 09/12/17 1224

## 2017-09-11 NOTE — ED Notes (Signed)
Patient transported to X-ray 
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
## 2017-09-11 NOTE — Telephone Encounter (Signed)
I informed pt of Dr. Leigh Aurora review of the doppler results and the importance of his appt with Dr. Gwenlyn Found on 09/20/2017 at 2:00pm. Pt states he is to see [place] on 09/20/2017 and I told him his appt with Dr. Jacqualyn Posey is 09/27/2017. Pt states he is to have an appt with the doctor who will do surgery on his legs on 09/20/2017 too. I told him if he had another appt other than Dr. Gwenlyn Found, on 09/20/2017 he should call their office to see what he needs for that appt.  Pt states he is having a lot of pain. I told him I would inform Dr. Jacqualyn Posey and call with instructions.

## 2017-09-11 NOTE — Telephone Encounter (Signed)
-----   Message from Trula Slade, DPM sent at 09/09/2017  6:49 PM EDT ----- Tivis Ringer- Looks like he already has an appointment with Dr. Gwenlyn Found but please let him know there is blockage in his leg and he needs to follow up with Dr. Gwenlyn Found. Thanks.

## 2017-09-11 NOTE — Progress Notes (Signed)
ANTICOAGULATION CONSULT NOTE - Initial Consult  Pharmacy Consult for heparin Indication: chest pain/ACS  Allergies  Allergen Reactions  . Tomato Rash    Patient Measurements: Height: 6\' 2"  (188 cm) Weight: 218 lb 0.6 oz (98.9 kg) IBW/kg (Calculated) : 82.2 Heparin Dosing Weight: 98.9kg  Vital Signs: Temp: 98.2 F (36.8 C) (06/26 1356) Temp Source: Oral (06/26 1356) BP: 143/94 (06/26 1600) Pulse Rate: 87 (06/26 1600)  Labs: Recent Labs    09/11/17 1318  HGB 8.4*  HCT 27.5*  PLT 331  LABPROT 14.4  INR 1.12  CREATININE 2.65*    Estimated Creatinine Clearance: 41 mL/min (A) (by C-G formula based on SCr of 2.65 mg/dL (H)).   Medical History: Past Medical History:  Diagnosis Date  . CHF (congestive heart failure) ([place])   . CVA (cerebral vascular accident) ([place])   . Heart failure ([place])   . Hypertension   . Status post CVA   . T2DM (type 2 diabetes mellitus) ([place])     Medications:  Infusions:  . heparin      Assessment: 88 yom presented to the ED with leg pain and fall. Troponin found to be elevated and Code STEMI activated. Starting IV heparin. Baseline Hgb is low at 8.4 and platelets are WNL. He is not on anticoagulation PTA.   Goal of Therapy:  Heparin level 0.3-0.7 units/ml Monitor platelets by anticoagulation protocol: Yes   Plan:  Heparin bolus 4000 units IV x 1 Heparin gtt 1300 units/hr F/u post-cath plans  Paytyn Mesta, Rande Lawman 09/11/2017,4:17 PM

## 2017-09-11 NOTE — Telephone Encounter (Signed)
Patient presented to ED today. Hubbard Hartshorn, RN, BSN

## 2017-09-11 NOTE — ED Provider Notes (Signed)
Patient seen and evaluated.  Discussed with PA.  Patient's main complaint is leg pain that seems consistent with exertional claudication.  Has palpable pulses.  Has ruptured bullae on his right foot.  He complains of chest pain.  States this is intermittent.  He is rather non-specific in his description but states it is sharp and comes and goes.  Has been there today.  Initially stated was not present now.  However, had end of my evaluation states it is "2/10".  EKG shows ST elevations 2, 3, and F, as well as V3-V6.  This is new versus comparison 519.  His troponin is elevated at 8.  He is given heparin bolus.  He is given aspirin 81 mg x 4.  I discussed the case with Dr. Terrence Dupont.  Code STEMI was activated.  Patient was taken emergently to the Cath Lab.  EKG Interpretation  Date/Time:  Wednesday September 11 2017 15:41:49 EDT Ventricular Rate:  86 PR Interval:    QRS Duration: 88 QT Interval:  368 QTC Calculation: 441 R Axis:   19 Text Interpretation:  Sinus rhythm Probable left atrial enlargement Inferior infarct, acute (RCA) Anterior infarct, old Lateral leads are also involved Probable RV involvement, suggest recording right precordial leads Confirmed by Tanna Furry [phone]) on 09/11/2017 3:53:50 PM  CRITICAL CARE Performed by: Lolita Patella   Total critical care time: 30 minutes  Critical care time was exclusive of separately billable procedures and treating other patients.  Critical care was necessary to treat or prevent imminent or life-threatening deterioration.  Critical care was time spent personally by me on the following activities: development of treatment plan with patient and/or surrogate as well as nursing, discussions with consultants, evaluation of patient's response to treatment, examination of patient, obtaining history from patient or surrogate, ordering and performing treatments and interventions, ordering and review of laboratory studies, ordering and review of radiographic  studies, pulse oximetry and re-evaluation of patient's condition.    Tanna Furry, MD 09/11/17 [phone]

## 2017-09-12 ENCOUNTER — Other Ambulatory Visit: Payer: Self-pay

## 2017-09-12 ENCOUNTER — Encounter (HOSPITAL_COMMUNITY): Payer: Self-pay | Admitting: Cardiology

## 2017-09-12 LAB — CBC
HCT: 24.9 % — ABNORMAL LOW (ref 39.0–52.0)
Hemoglobin: 7.7 g/dL — ABNORMAL LOW (ref 13.0–17.0)
MCH: 25.4 pg — ABNORMAL LOW (ref 26.0–34.0)
MCHC: 30.9 g/dL (ref 30.0–36.0)
MCV: 82.2 fL (ref 78.0–100.0)
Platelets: 341 10*3/uL (ref 150–400)
RBC: 3.03 MIL/uL — ABNORMAL LOW (ref 4.22–5.81)
RDW: 14.9 % (ref 11.5–15.5)
WBC: 11.3 10*3/uL — AB (ref 4.0–10.5)

## 2017-09-12 LAB — GLUCOSE, CAPILLARY
Glucose-Capillary: 144 mg/dL — ABNORMAL HIGH (ref 70–99)
Glucose-Capillary: 125 mg/dL — ABNORMAL HIGH (ref 70–99)
Glucose-Capillary: 185 mg/dL — ABNORMAL HIGH (ref 70–99)
Glucose-Capillary: 175 mg/dL — ABNORMAL HIGH (ref 70–99)

## 2017-09-12 LAB — BASIC METABOLIC PANEL
ANION GAP: 13 (ref 5–15)
BUN: 33 mg/dL — ABNORMAL HIGH (ref 6–20)
CHLORIDE: 104 mmol/L (ref 98–111)
CO2: 21 mmol/L — AB (ref 22–32)
CREATININE: 2.85 mg/dL — AB (ref 0.61–1.24)
Calcium: 7.7 mg/dL — ABNORMAL LOW (ref 8.9–10.3)
GFR calc non Af Amer: 24 mL/min — ABNORMAL LOW (ref >60)
GFR, EST AFRICAN AMERICAN: 28 mL/min — AB (ref >60)
Glucose, Bld: 133 mg/dL — ABNORMAL HIGH (ref 70–99)
Potassium: 3.3 mmol/L — ABNORMAL LOW (ref 3.5–5.1)
Sodium: 138 mmol/L (ref 135–145)

## 2017-09-12 LAB — TROPONIN I
Troponin I: 5.46 ng/mL (ref <0.03)
Troponin I: 6.24 ng/mL (ref <0.03)

## 2017-09-12 LAB — LIPID PANEL
Cholesterol: 181 mg/dL (ref 0–200)
HDL: 29 mg/dL — AB (ref >40)
LDL CALC: 133 mg/dL — AB (ref 0–99)
TRIGLYCERIDES: 93 mg/dL (ref <150)
Total CHOL/HDL Ratio: 6.2 RATIO
VLDL: 19 mg/dL (ref 0–40)

## 2017-09-12 LAB — POCT ACTIVATED CLOTTING TIME
ACTIVATED CLOTTING TIME: 175 s
ACTIVATED CLOTTING TIME: 219 s
ACTIVATED CLOTTING TIME: 241 s
Activated Clotting Time: 169 seconds

## 2017-09-12 LAB — ABO/RH: ABO/RH(D): AB POS

## 2017-09-12 LAB — PREPARE RBC (CROSSMATCH)

## 2017-09-12 MED ORDER — POTASSIUM CHLORIDE CRYS ER 20 MEQ PO TBCR
40.0000 meq | EXTENDED_RELEASE_TABLET | Freq: Once | ORAL | Status: AC
  Administered 2017-09-12: 40 meq via ORAL
  Filled 2017-09-12: qty 2

## 2017-09-12 MED ORDER — FUROSEMIDE 10 MG/ML IJ SOLN
INTRAMUSCULAR | Status: AC
  Filled 2017-09-12: qty 4

## 2017-09-12 MED ORDER — SODIUM CHLORIDE 0.9% IV SOLUTION
Freq: Once | INTRAVENOUS | Status: AC
  Administered 2017-09-12: 11:00:00 via INTRAVENOUS

## 2017-09-12 MED ORDER — ALPRAZOLAM 0.25 MG PO TABS
0.2500 mg | ORAL_TABLET | Freq: Three times a day (TID) | ORAL | Status: DC | PRN
  Administered 2017-09-12 – 2017-09-24 (×17): 0.25 mg via ORAL
  Filled 2017-09-12 (×19): qty 1

## 2017-09-12 MED ORDER — ISOSORBIDE DINITRATE 10 MG PO TABS
20.0000 mg | ORAL_TABLET | Freq: Three times a day (TID) | ORAL | Status: DC
  Administered 2017-09-12 – 2017-09-14 (×6): 20 mg via ORAL
  Filled 2017-09-12 (×6): qty 2

## 2017-09-12 MED ORDER — FUROSEMIDE 10 MG/ML IJ SOLN
40.0000 mg | Freq: Once | INTRAMUSCULAR | Status: AC
  Administered 2017-09-12: 40 mg via INTRAVENOUS

## 2017-09-12 MED ORDER — ATROPINE SULFATE 1 MG/10ML IJ SOSY
PREFILLED_SYRINGE | INTRAMUSCULAR | Status: AC
  Filled 2017-09-12: qty 10

## 2017-09-12 MED ORDER — HYDRALAZINE HCL 25 MG PO TABS
37.5000 mg | ORAL_TABLET | Freq: Three times a day (TID) | ORAL | Status: DC
  Administered 2017-09-12 – 2017-09-14 (×6): 37.5 mg via ORAL
  Filled 2017-09-12 (×6): qty 2

## 2017-09-12 NOTE — Progress Notes (Signed)
[facility]  Patient Status: Active (receiving services up to time of hospitalization)  [facility] is providing the following services: RN, PT and OT  If patient discharges after hours, please call [phone].   Tommy Mclaughlin 09/12/2017, 10:04 AM

## 2017-09-12 NOTE — Progress Notes (Signed)
Subjective:  Patient denies any chest pain or shortness of breath. Tolerated PCI to 100% occluded OM 2. Noted to have critical mid and distal LAD stenosis.  Objective:  Vital Signs in the last 24 hours: Temp:  [97.6 F (36.4 C)-98.2 F (36.8 C)] 98.1 F (36.7 C) (06/27 0733) Pulse Rate:  [0-163] 91 (06/27 0900) Resp:  [8-119] 28 (06/27 0900) BP: (123-158)/(60-100) 140/94 (06/27 0815) SpO2:  [0 %-100 %] 91 % (06/27 0900) Arterial Line BP: (139-174)/(73-92) 170/88 (06/27 0400) Weight:  [98.9 kg (218 lb 0.6 oz)-103.9 kg (229 lb 0.9 oz)] 103.9 kg (229 lb 0.9 oz) (06/26 1850)  Intake/Output from previous day: 06/26 0701 - 06/27 0700 In: 197.2 [I.V.:197.2] Out: -  Intake/Output from this shift: Total I/O In: 9 [I.V.:9] Out: 0   Physical Exam: Neck: no adenopathy, no carotid bruit, no JVD and supple, symmetrical, trachea midline Lungs: clear to auscultation bilaterally Heart: regular rate and rhythm, S1, S2 normal and Soft systolic murmur noted Abdomen: soft, non-tender; bowel sounds normal; no masses,  no organomegaly Extremities: extremities normal, atraumatic, no cyanosis or edema and Right groin stable no hematoma dressing dry  Lab Results: Recent Labs    09/11/17 1318 09/12/17 0743  WBC 10.5 11.3*  HGB 8.4* 7.7*  PLT 331 341   Recent Labs    09/11/17 1318 09/12/17 0743  NA 139 138  K 3.2* 3.3*  CL 104 104  CO2 24 21*  GLUCOSE 160* 133*  BUN 30* 33*  CREATININE 2.65* 2.85*   Recent Labs    09/11/17 2021 09/12/17 0743  TROPONINI 6.24* 5.46*   Hepatic Function Panel No results for input(s): PROT, ALBUMIN, AST, ALT, ALKPHOS, BILITOT, BILIDIR, IBILI in the last 72 hours. Recent Labs    09/12/17 0743  CHOL 181   No results for input(s): PROTIME in the last 72 hours.  Imaging: Imaging results have been reviewed and Dg Chest Port 1 View  Result Date: 09/11/2017 CLINICAL DATA:  Dyspnea EXAM: PORTABLE CHEST 1 VIEW COMPARISON:  07/22/2017 chest radiograph.  FINDINGS: Stable cardiomediastinal silhouette with mild cardiomegaly. No pneumothorax. No pleural effusion. Mild pulmonary edema. IMPRESSION: Mild pulmonary edema with mild cardiomegaly, suggesting mild congestive heart failure. Electronically Signed   By: Ilona Sorrel M.D.   On: 09/11/2017 21:15    Cardiac Studies:  Assessment/Plan:  Acute inferolateral wall myocardial infarction with late presentation Status post PCI to OM 2 Hypertensive heart disease with systolic dysfunction Decompensated systolic congestive heart failure Insulin requiring diabetes mellitus History of right CVA with left paresis Tobacco abuse quit last year Acute on chronicChronic kidney disease stage IV secondary to contrast Acute on chronic anemia secondary to hydration doubt GI loss Family history of coronary artery disease Plan Type and cross match and transfuse packed RBCs in view of significant CAD and need for dual antiplatelet medications  check labs in a.m.  LOS: 1 day    Charolette Forward 09/12/2017, 10:17 AM

## 2017-09-12 NOTE — Consult Note (Signed)
[facility] Nurse wound consult note Assessment of right foot completed in [employer] with the assistance of his primary RN, Hayley.  No family present. Reason for Consult: Bullae, intact and ruptured, to right foot Wound type: Fluid filled bullae.  The etiology is not completely clear to me.  Patient states he recently obtained a pair of "diabetic shoes" and has been wearing them.  But these areas do not show signs of shoe rubbing.  There is swelling to the right leg and foot. Injury POA: Yes.  Patient states he fell in the shower recently and when that happened the skin over the bulla on the top of the right foot and extending to the great toe was torn off. Measurement: Medial dorsal foot extending to the great toe has an area missing the epithelium. This wound measures 7 cm x 3 cm.  There is not odor or drainage eminating from the wound; no erythema or induration.  The wound bed is 100% clean and pink.  The right midfoot has a fluid bulla that measures 5 cm x 11 cm with overlaying skin intact.  There is no erythema or induration at this area.  The right lateral heel has a fluid bulla that measures 4 cm x 5.3 cm with overlaying skin intact.  There is no erythema or induration at this area.  Plan of care for all sites:  Apply vaseline gauzes, wrap in kerlex, change daily. Monitor the wound area(s) for worsening of condition such as: Signs/symptoms of infection,  Increase in size,  Development of or worsening of odor, Development of pain, or increased pain at the affected locations.  Notify the medical team if any of these develop.  Thank you for the consult.  Discussed plan of care with the patient and bedside nurse.  [facility] nurse will not follow at this time.  Please re-consult the [facility] [place] team if needed.  Val Riles, RN, MSN, CWOCN, CNS-BC, pager [phone]

## 2017-09-12 NOTE — Progress Notes (Signed)
Inpatient Diabetes Program Recommendations  AACE/ADA: New Consensus Statement on Inpatient Glycemic Control (2015)  Target Ranges:  Prepandial:   less than 140 mg/dL      Peak postprandial:   less than 180 mg/dL (1-2 hours)      Critically ill patients:  140 - 180 mg/dL   Lab Results  Component Value Date   GLUCAP 125 (H) 09/12/2017   HGBA1C 8.8 (H) 09/11/2017    Review of Glycemic Control Results for Tommy Mclaughlin, Tommy Mclaughlin (MRN [id number]) as of 09/12/2017 13:20  Ref. Range 09/11/2017 19:41 09/12/2017 08:08 09/12/2017 12:24  Glucose-Capillary Latest Ref Range: 70 - 99 mg/dL 124 (H) 144 (H) 125 (H)   Diabetes history: Type 2 DM Outpatient Diabetes medications: Trulicity .75 mg Q/wk Current orders for Inpatient glycemic control: Novolog 0-9 units TID  Inpatient Diabetes Program Recommendations:    Spoke with patient regarding outpatient DM management. Patient goes to the internal medicine clinic and is managed there. Patient has meter, but has not been checking BS. Patient reports, " I have been on Novolog, Lantus and Metformin in the past. I DO NOT want Metformin ever again. If you are thinking about adding anything for my BS I would prefer Lantus again." The patient states that the only reason he was taken off this regimen was because he could take his "insulin" once a week.   Reviewed patient's current A1c of 8.8%. Explained what a A1c is and what it measures. Also reviewed goal A1c with patient, importance of good glucose control @ home, and blood sugar goals. Reviewed patho of DM, role of pancreas, vascular changes that occur from poor control and long term co-morbidities associated with DM. Also, educated patient on the role of GLP drugs, such as Trulicity and differences between this medication and insulin.   Encouraged patient to check BS 2-3 times per day. Patient denies drinking sugary beverages or indulging in a high carb diet. Stressed the importance of continued followed up with PCP.  No  further questions regarding DM at this time.  Thanks, Bronson Curb, MSN, RNC-OB Diabetes Coordinator [phone] (8a-5p)

## 2017-09-12 NOTE — Progress Notes (Signed)
66 Talked with pt's RN. Pt has not gotten blood transfusion yet. Getting ready to get blood started. Will hold ambulation and follow up tomorrow. Graylon Good RN BSN 09/12/2017 1:34 PM

## 2017-09-12 NOTE — Progress Notes (Signed)
CRITICAL VALUE ALERT  Critical Value:  Troponin 5.46, K+ 3.3, Hgb 7.7  Date & Time Notied:  MD Harwani  Provider Notified: 1982  Orders Received/Actions taken: Troponin expected value no new orders, hgb- order to transfuse blood, k+: order for K replacement

## 2017-09-12 NOTE — Progress Notes (Addendum)
09/11/2017 1930- angiomax gtt complete 2200- ACT241 09/12/2017 0000- ACT 219 0230- ACT 175 0430-ACT 169 0447-Sheath removal 0506- end hold pressure; no bleeding noted at site. Pulses present and palpable. Site level 0. Gauze dressing placed.   Pt reminded to remain flat on back until 1000. Will continue to remind.

## 2017-09-13 LAB — BASIC METABOLIC PANEL
Anion gap: 12 (ref 5–15)
BUN: 40 mg/dL — AB (ref 6–20)
CALCIUM: 8 mg/dL — AB (ref 8.9–10.3)
CHLORIDE: 104 mmol/L (ref 98–111)
CO2: 21 mmol/L — AB (ref 22–32)
CREATININE: 4.11 mg/dL — AB (ref 0.61–1.24)
GFR, EST AFRICAN AMERICAN: 18 mL/min — AB (ref >60)
GFR, EST NON AFRICAN AMERICAN: 15 mL/min — AB (ref >60)
GLUCOSE: 158 mg/dL — AB (ref 70–99)
Potassium: 3.9 mmol/L (ref 3.5–5.1)
Sodium: 137 mmol/L (ref 135–145)

## 2017-09-13 LAB — BPAM RBC
Blood Product Expiration Date: 201907192359
Blood Product Expiration Date: 201907192359
ISSUE DATE / TIME: 201906271339
ISSUE DATE / TIME: 201906271339
UNIT TYPE AND RH: 8400
Unit Type and Rh: 8400

## 2017-09-13 LAB — TYPE AND SCREEN
ABO/RH(D): AB POS
Antibody Screen: NEGATIVE
Unit division: 0
Unit division: 0

## 2017-09-13 LAB — GLUCOSE, CAPILLARY
GLUCOSE-CAPILLARY: 105 mg/dL — AB (ref 70–99)
GLUCOSE-CAPILLARY: 142 mg/dL — AB (ref 70–99)
Glucose-Capillary: 181 mg/dL — ABNORMAL HIGH (ref 70–99)
Glucose-Capillary: 146 mg/dL — ABNORMAL HIGH (ref 70–99)
Glucose-Capillary: 201 mg/dL — ABNORMAL HIGH (ref 70–99)

## 2017-09-13 LAB — CBC
HCT: 31 % — ABNORMAL LOW (ref 39.0–52.0)
Hemoglobin: 9.7 g/dL — ABNORMAL LOW (ref 13.0–17.0)
MCH: 26.3 pg (ref 26.0–34.0)
MCHC: 31.3 g/dL (ref 30.0–36.0)
MCV: 84 fL (ref 78.0–100.0)
Platelets: 334 10*3/uL (ref 150–400)
RBC: 3.69 MIL/uL — ABNORMAL LOW (ref 4.22–5.81)
RDW: 15 % (ref 11.5–15.5)
WBC: 12.9 10*3/uL — AB (ref 4.0–10.5)

## 2017-09-13 LAB — TROPONIN I: TROPONIN I: 4.56 ng/mL — AB (ref <0.03)

## 2017-09-13 MED ORDER — POLYETHYLENE GLYCOL 3350 17 G PO PACK
17.0000 g | PACK | Freq: Every day | ORAL | Status: DC | PRN
  Administered 2017-09-13: 17 g via ORAL
  Filled 2017-09-13: qty 1

## 2017-09-13 MED ORDER — CEFAZOLIN SODIUM-DEXTROSE 1-4 GM/50ML-% IV SOLN
1.0000 g | Freq: Two times a day (BID) | INTRAVENOUS | Status: DC
  Administered 2017-09-13 – 2017-09-14 (×4): 1 g via INTRAVENOUS
  Filled 2017-09-13 (×5): qty 50

## 2017-09-13 NOTE — Progress Notes (Addendum)
CARDIAC REHAB PHASE I   PRE:  Rate/Rhythm: 13 SR  BP:  Sitting: 147/68      SaO2: 98 RA  MODE:  Ambulation: pt walked around bed to recliner   POST:  Rate/Rhythm: 94 SR  BP:  Sitting: 149/85    SaO2: 95 RA   Pt walked around bed to recliner assist of one with front wheel rolling walker. Pt c/o SOB. Pts sats maintained 95 on RA. Pt guided through pursed lip breathing and placed on 2L [place] for comfort. Pt states some relief. Encouraged pt to get OOB over the weekend and ambulate as able. May need PT consult d/t poor mobility and deconditioning. Will continue to follow as pt waits for staged PCI.  [phone] Rufina Falco, RN BSN 09/13/2017 12:02 PM

## 2017-09-13 NOTE — Progress Notes (Signed)
Pt stated to this RN has only urinated twice since admission, stated has the urge however no urine is produced, the RN communicated to NT about urine output, and in conclusion pt has not urinated today on 7am shift, md paged

## 2017-09-13 NOTE — Progress Notes (Signed)
Subjective:  Patient denies any chest pain or shortness of breath.  Creatinine expectedly trending up post-PCI.   Had workup for chronic renal insufficiency in the recent past.  Complains of vague abdominal pain.  States has not moved bowel in last few days.  Objective:  Vital Signs in the last 24 hours: Temp:  [97.8 F (36.6 C)-98.6 F (37 C)] 98 F (36.7 C) (06/28 0821) Pulse Rate:  [86-96] 93 (06/28 1000) Resp:  [19-35] 24 (06/28 1000) BP: (111-163)/(74-104) 146/83 (06/28 1000) SpO2:  [90 %-99 %] 95 % (06/28 1000)  Intake/Output from previous day: 06/27 0701 - 06/28 0700 In: 933.5 [P.O.:360; I.V.:258.5; Blood:315] Out: 950 [Urine:950] Intake/Output from this shift: Total I/O In: 134.5 [P.O.:100; I.V.:34.5] Out: -   Physical Exam: Neck: no adenopathy, no carotid bruit, no JVD and supple, symmetrical, trachea midline Lungs: clear to auscultation bilaterally Heart: regular rate and rhythm, S1, S2 normal and soft systolic murmur noted Abdomen: soft, non-tender; bowel sounds normal; no masses,  no organomegaly Extremities: extremities normal, atraumatic, no cyanosis or edema and multiple superficial skin ulcers noted.  Right groin is stable  Lab Results: Recent Labs    09/12/17 0743 09/13/17 0302  WBC 11.3* 12.9*  HGB 7.7* 9.7*  PLT 341 334   Recent Labs    09/12/17 0743 09/13/17 0302  NA 138 137  K 3.3* 3.9  CL 104 104  CO2 21* 21*  GLUCOSE 133* 158*  BUN 33* 40*  CREATININE 2.85* 4.11*   Recent Labs    09/12/17 1836 09/13/17 0844  TROPONINI 6.24* 4.56*   Hepatic Function Panel No results for input(s): PROT, ALBUMIN, AST, ALT, ALKPHOS, BILITOT, BILIDIR, IBILI in the last 72 hours. Recent Labs    09/12/17 0743  CHOL 181   No results for input(s): PROTIME in the last 72 hours.  Imaging: Imaging results have been reviewed and Dg Chest Port 1 View  Result Date: 09/11/2017 CLINICAL DATA:  Dyspnea EXAM: PORTABLE CHEST 1 VIEW COMPARISON:  07/22/2017 chest  radiograph. FINDINGS: Stable cardiomediastinal silhouette with mild cardiomegaly. No pneumothorax. No pleural effusion. Mild pulmonary edema. IMPRESSION: Mild pulmonary edema with mild cardiomegaly, suggesting mild congestive heart failure. Electronically Signed   By: Ilona Sorrel M.D.   On: 09/11/2017 21:15    Cardiac Studies:  Assessment/Plan:  Acute inferolateral wall myocardial infarction with late presentation Status post PCI to OM 2 Hypertensive heart disease with systolic dysfunction Decompensated systolic congestive heart failure Insulin requiring diabetes mellitus Possible diabetic gastroparesis History of right CVA with left paresis Tobacco abuse quit last year Acute on chronicChronic kidney disease stage IV secondary to contrast Acute on chronic anemia secondary to hydration doubt GI loss Family history of coronary artery disease constipation Plan Continue present management. Rx for constipation. Monitor renal function closely. Dr. Doylene Canard on call for the weekend for me Discussed with patient regarding staged PCI early next week.  Once renal function towards baseline.  it's risk and benefits and agreesperiod Check labs in a.m.  LOS: 2 days    Charolette Forward 09/13/2017, 10:19 AM

## 2017-09-14 ENCOUNTER — Inpatient Hospital Stay (HOSPITAL_COMMUNITY): Payer: Medicare Other

## 2017-09-14 ENCOUNTER — Encounter (HOSPITAL_COMMUNITY): Payer: Self-pay | Admitting: Nephrology

## 2017-09-14 LAB — PROTEIN / CREATININE RATIO, URINE
Creatinine, Urine: 95.89 mg/dL
Protein Creatinine Ratio: 8.04 mg/mg{Cre} — ABNORMAL HIGH (ref 0.00–0.15)
Total Protein, Urine: 771 mg/dL

## 2017-09-14 LAB — URINALYSIS, ROUTINE W REFLEX MICROSCOPIC
BILIRUBIN URINE: NEGATIVE
Glucose, UA: 50 mg/dL — AB
KETONES UR: NEGATIVE mg/dL
LEUKOCYTES UA: NEGATIVE
NITRITE: NEGATIVE
PH: 5 (ref 5.0–8.0)
Specific Gravity, Urine: 1.019 (ref 1.005–1.030)

## 2017-09-14 LAB — BASIC METABOLIC PANEL
ANION GAP: 14 (ref 5–15)
BUN: 46 mg/dL — ABNORMAL HIGH (ref 6–20)
CALCIUM: 7.7 mg/dL — AB (ref 8.9–10.3)
CO2: 20 mmol/L — AB (ref 22–32)
CREATININE: 5.32 mg/dL — AB (ref 0.61–1.24)
Chloride: 101 mmol/L (ref 98–111)
GFR calc Af Amer: 13 mL/min — ABNORMAL LOW (ref >60)
GFR calc non Af Amer: 11 mL/min — ABNORMAL LOW (ref >60)
GLUCOSE: 131 mg/dL — AB (ref 70–99)
Potassium: 3.6 mmol/L (ref 3.5–5.1)
Sodium: 135 mmol/L (ref 135–145)

## 2017-09-14 LAB — CREATININE, URINE, RANDOM: Creatinine, Urine: 96.61 mg/dL

## 2017-09-14 LAB — GLUCOSE, CAPILLARY
GLUCOSE-CAPILLARY: 133 mg/dL — AB (ref 70–99)
Glucose-Capillary: 131 mg/dL — ABNORMAL HIGH (ref 70–99)
Glucose-Capillary: 152 mg/dL — ABNORMAL HIGH (ref 70–99)
Glucose-Capillary: 177 mg/dL — ABNORMAL HIGH (ref 70–99)

## 2017-09-14 LAB — CBC
HCT: 29.3 % — ABNORMAL LOW (ref 39.0–52.0)
HEMOGLOBIN: 9.2 g/dL — AB (ref 13.0–17.0)
MCH: 26 pg (ref 26.0–34.0)
MCHC: 31.4 g/dL (ref 30.0–36.0)
MCV: 82.8 fL (ref 78.0–100.0)
Platelets: 369 10*3/uL (ref 150–400)
RBC: 3.54 MIL/uL — ABNORMAL LOW (ref 4.22–5.81)
RDW: 15.2 % (ref 11.5–15.5)
WBC: 11.2 10*3/uL — ABNORMAL HIGH (ref 4.0–10.5)

## 2017-09-14 LAB — SODIUM, URINE, RANDOM: Sodium, Ur: 10 mmol/L

## 2017-09-14 LAB — TROPONIN I: TROPONIN I: 5.28 ng/mL — AB (ref <0.03)

## 2017-09-14 MED ORDER — FUROSEMIDE 10 MG/ML IJ SOLN
80.0000 mg | Freq: Once | INTRAMUSCULAR | Status: AC
  Administered 2017-09-14: 80 mg via INTRAVENOUS
  Filled 2017-09-14: qty 8

## 2017-09-14 MED ORDER — HYDRALAZINE HCL 25 MG PO TABS
25.0000 mg | ORAL_TABLET | Freq: Three times a day (TID) | ORAL | Status: DC
  Administered 2017-09-14 – 2017-09-16 (×7): 25 mg via ORAL
  Filled 2017-09-14 (×7): qty 1

## 2017-09-14 MED ORDER — ISOSORBIDE DINITRATE 10 MG PO TABS
20.0000 mg | ORAL_TABLET | Freq: Three times a day (TID) | ORAL | Status: DC
  Administered 2017-09-14 – 2017-09-16 (×8): 20 mg via ORAL
  Filled 2017-09-14 (×8): qty 2

## 2017-09-14 MED ORDER — SODIUM CHLORIDE 0.9 % IV SOLN: INTRAVENOUS | Status: DC
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
## 2017-09-14 NOTE — Progress Notes (Signed)
Came to try ambulation with pt and took vitals however noted his troponin is higher this am. He has also c/o CP. Will hold ambulation today.  [id number] Yves Dill CES, ACSM 3:00 PM 09/14/2017

## 2017-09-14 NOTE — Progress Notes (Signed)
Ref: Tommy Roan, MD   Subjective:  Occasional chest pain and thigh pain. Awaiting additional angioplasties on coronaries and extremities vessels but has acute renal failure, contrast induced. VS stable. No significant urine output per nurse. He has h/o PVD.  Objective:  Vital Signs in the last 24 hours: Temp:  [97.7 F (36.5 C)-98.3 F (36.8 C)] 97.7 F (36.5 C) (06/29 0748) Pulse Rate:  [84-94] 88 (06/29 0735) Cardiac Rhythm: Normal sinus rhythm (06/28 1600) Resp:  [18-33] 20 (06/29 0735) BP: (110-150)/(64-93) 123/84 (06/29 0735) SpO2:  [91 %-100 %] 99 % (06/29 0735)  Physical Exam: BP Readings from Last 1 Encounters:  09/14/17 123/84     Wt Readings from Last 1 Encounters:  09/11/17 103.9 kg (229 lb 0.9 oz)    Weight change:  Body mass index is 29.41 kg/m. HEENT: [place]/AT, Eyes-Brown, Conjunctiva-Pale pink, Sclera-Non-icteric Neck: + JVD, No bruit, Trachea midline. Lungs:  Clearing, Bilateral. Cardiac:  Regular rhythm, normal S1 and S2, no S3. II/VI systolic murmur. Abdomen:  Soft, non-tender. BS present. Extremities:  1+ lower leg edema present. No cyanosis. No clubbing. CNS: AxOx3, Cranial nerves grossly intact, moves all 4 extremities.  Skin: Warm and dry.   Intake/Output from previous day: 06/28 0701 - 06/29 0700 In: 754 [P.O.:340; I.V.:264; IV Piggyback:150] Out: 1050 [Urine:1050]    Lab Results: BMET    Component Value Date/Time   NA 135 09/14/2017 0243   NA 137 09/13/2017 0302   NA 138 09/12/2017 0743   NA 141 08/27/2017 1134   NA 143 08/21/2017 1352   NA 145 (H) 07/30/2017 1654   K 3.6 09/14/2017 0243   K 3.9 09/13/2017 0302   K 3.3 (L) 09/12/2017 0743   CL 101 09/14/2017 0243   CL 104 09/13/2017 0302   CL 104 09/12/2017 0743   CO2 20 (L) 09/14/2017 0243   CO2 21 (L) 09/13/2017 0302   CO2 21 (L) 09/12/2017 0743   GLUCOSE 131 (H) 09/14/2017 0243   GLUCOSE 158 (H) 09/13/2017 0302   GLUCOSE 133 (H) 09/12/2017 0743   BUN 46 (H) 09/14/2017  0243   BUN 40 (H) 09/13/2017 0302   BUN 33 (H) 09/12/2017 0743   BUN 32 (H) 08/27/2017 1134   BUN 36 (H) 08/21/2017 1352   BUN 26 (H) 07/30/2017 1654   CREATININE 5.32 (H) 09/14/2017 0243   CREATININE 4.11 (H) 09/13/2017 0302   CREATININE 2.85 (H) 09/12/2017 0743   CALCIUM 7.7 (L) 09/14/2017 0243   CALCIUM 8.0 (L) 09/13/2017 0302   CALCIUM 7.7 (L) 09/12/2017 0743   GFRNONAA 11 (L) 09/14/2017 0243   GFRNONAA 15 (L) 09/13/2017 0302   GFRNONAA 24 (L) 09/12/2017 0743   GFRAA 13 (L) 09/14/2017 0243   GFRAA 18 (L) 09/13/2017 0302   GFRAA 28 (L) 09/12/2017 0743   CBC    Component Value Date/Time   WBC 11.2 (H) 09/14/2017 0243   RBC 3.54 (L) 09/14/2017 0243   HGB 9.2 (L) 09/14/2017 0243   HCT 29.3 (L) 09/14/2017 0243   HCT 29.2 (L) 07/25/2017 0346   PLT 369 09/14/2017 0243   MCV 82.8 09/14/2017 0243   MCH 26.0 09/14/2017 0243   MCHC 31.4 09/14/2017 0243   RDW 15.2 09/14/2017 0243   LYMPHSABS 2.7 09/11/2017 1318   MONOABS 1.0 09/11/2017 1318   EOSABS 0.1 09/11/2017 1318   BASOSABS 0.0 09/11/2017 1318   HEPATIC Function Panel Recent Labs    07/22/17 1722  PROT 6.0*   HEMOGLOBIN A1C No components found  for: HGA1C,  MPG CARDIAC ENZYMES Lab Results  Component Value Date   TROPONINI 5.28 (HH) 09/14/2017   TROPONINI 4.56 (HH) 09/13/2017   TROPONINI 6.24 (HH) 09/12/2017   BNP No results for input(s): PROBNP in the last 8760 hours. TSH Recent Labs    07/24/17 0725 09/11/17 1904  TSH 0.698 1.120   CHOLESTEROL Recent Labs    09/12/17 0743  CHOL 181    Scheduled Meds: . aspirin EC  81 mg Oral Daily  . atorvastatin  80 mg Oral q1800  . carvedilol  6.25 mg Oral BID WC  . hydrALAZINE  37.5 mg Oral Q8H   And  . isosorbide dinitrate  20 mg Oral TID  . insulin aspart  0-9 Units Subcutaneous TID WC  . pantoprazole  40 mg Oral Q0600  . sodium chloride flush  3 mL Intravenous Q12H  . ticagrelor  90 mg Oral BID   Continuous Infusions: . sodium chloride 10 mL/hr at  09/14/17 0700  .  ceFAZolin (ANCEF) IV Stopped (09/13/17 2249)  . nitroGLYCERIN 5 mcg/min (09/12/17 1613)   PRN Meds:.sodium chloride, acetaminophen, ALPRAZolam, bacitracin, nitroGLYCERIN, ondansetron (ZOFRAN) IV, polyethylene glycol, sodium chloride flush  Assessment/Plan: Acute infero-lateral wall MI S/P PCI to OM 2 HTN Decompensated systolic left heart failure Acute renal failure, contrast induced Type 2 DM PVD Constipation  Renal consult Continue IV NTG and medical treatment.    LOS: 3 days    Dixie Dials  MD  09/14/2017, 9:06 AM

## 2017-09-15 LAB — RENAL FUNCTION PANEL
ANION GAP: 13 (ref 5–15)
Albumin: 1.6 g/dL — ABNORMAL LOW (ref 3.5–5.0)
BUN: 55 mg/dL — ABNORMAL HIGH (ref 6–20)
CHLORIDE: 101 mmol/L (ref 98–111)
CO2: 20 mmol/L — ABNORMAL LOW (ref 22–32)
Calcium: 7.4 mg/dL — ABNORMAL LOW (ref 8.9–10.3)
Creatinine, Ser: 6.15 mg/dL — ABNORMAL HIGH (ref 0.61–1.24)
GFR, EST AFRICAN AMERICAN: 11 mL/min — AB (ref >60)
GFR, EST NON AFRICAN AMERICAN: 9 mL/min — AB (ref >60)
Glucose, Bld: 140 mg/dL — ABNORMAL HIGH (ref 70–99)
POTASSIUM: 3.4 mmol/L — AB (ref 3.5–5.1)
Phosphorus: 4.5 mg/dL (ref 2.5–4.6)
Sodium: 134 mmol/L — ABNORMAL LOW (ref 135–145)

## 2017-09-15 LAB — GLUCOSE, CAPILLARY
GLUCOSE-CAPILLARY: 133 mg/dL — AB (ref 70–99)
GLUCOSE-CAPILLARY: 204 mg/dL — AB (ref 70–99)
GLUCOSE-CAPILLARY: 195 mg/dL — AB (ref 70–99)
Glucose-Capillary: 145 mg/dL — ABNORMAL HIGH (ref 70–99)

## 2017-09-15 MED ORDER — CEFAZOLIN SODIUM-DEXTROSE 1-4 GM/50ML-% IV SOLN
1.0000 g | Freq: Two times a day (BID) | INTRAVENOUS | Status: DC
  Administered 2017-09-15 – 2017-09-21 (×12): 1 g via INTRAVENOUS
  Filled 2017-09-15 (×13): qty 50

## 2017-09-15 MED ORDER — MAGNESIUM SULFATE IN D5W 1-5 GM/100ML-% IV SOLN
1.0000 g | Freq: Once | INTRAVENOUS | Status: AC
  Administered 2017-09-15: 1 g via INTRAVENOUS
  Filled 2017-09-15: qty 100

## 2017-09-15 MED ORDER — POTASSIUM CHLORIDE ER 10 MEQ PO TBCR
10.0000 meq | EXTENDED_RELEASE_TABLET | Freq: Three times a day (TID) | ORAL | Status: AC
  Administered 2017-09-15 (×3): 10 meq via ORAL
  Filled 2017-09-15 (×6): qty 1

## 2017-09-15 MED ORDER — AMLODIPINE BESYLATE 5 MG PO TABS
5.0000 mg | ORAL_TABLET | Freq: Every day | ORAL | Status: DC
  Administered 2017-09-15 – 2017-09-16 (×2): 5 mg via ORAL
  Filled 2017-09-15 (×2): qty 1

## 2017-09-15 MED ORDER — SODIUM CHLORIDE 0.9 % IV SOLN: INTRAVENOUS | Status: DC

## 2017-09-15 NOTE — Progress Notes (Signed)
[facility] Progress Note  Subjective: 1500 cc out yest w/ IV lasix x 1.  No sig SOB today. No n/v or fatigue.   Vitals:   09/15/17 0900 09/15/17 1000 09/15/17 1129 09/15/17 1221  BP: 132/88 (!) 157/93 137/87   Pulse: 90 86    Resp: (!) 28 (!) 23    Temp:    98.4 F (36.9 C)  TempSrc:    Oral  SpO2: 99% 92%    Weight:      Height:        Inpatient medications: . amLODipine  5 mg Oral Daily  . aspirin EC  81 mg Oral Daily  . atorvastatin  80 mg Oral q1800  . carvedilol  6.25 mg Oral BID WC  . hydrALAZINE  25 mg Oral Q8H   And  . isosorbide dinitrate  20 mg Oral TID  . insulin aspart  0-9 Units Subcutaneous TID WC  . pantoprazole  40 mg Oral Q0600  . potassium chloride  10 mEq Oral TID  . sodium chloride flush  3 mL Intravenous Q12H  . ticagrelor  90 mg Oral BID   . sodium chloride 10 mL/hr at 09/15/17 1100  . sodium chloride    .  ceFAZolin (ANCEF) IV Stopped (09/14/17 2206)  . nitroGLYCERIN 5 mcg/min (09/15/17 1100)   sodium chloride, acetaminophen, ALPRAZolam, bacitracin, nitroGLYCERIN, ondansetron (ZOFRAN) IV, polyethylene glycol, sodium chloride flush  Exam: Gen aelrt no distress No jvd or bruits Chest clear bilat to bases no rales or bronchial bs RRR no MRG Abd soft ntnd no mass or ascites +bs GU normal male w foley in place MS no joint effusions or deformity  Ext 1+ bilat pretib edema, no wounds or ulcers Neuro is alert, Ox 3 , nf   Home meds:  - lipitor/ pepcid  - lasix 40/ coreg 25 bid/ cozaar 50 qd  - prn ultram/ nicotine patch  - dulaglutide 0.75 mg sq weekly   - UA 6/29 > >300 prot, 0-5 rbc/ 6-10 wbc - urine protein: creat ratio > 8.04 mg/mg - renal US 09/14/17 > 12-13 cm kidneys, no hydro  - CXR 6/26 mild CHF  - echo 6/29 LVEF 30-35%, diffuse hypoK, G1DD    date                Creat               Notes  may 2019       1.8- 2.10  Aug 21 2017      2.28                 37 stage III ckd  Aug 27 2017    1.89                 46  stage III ckd  jun 26             2.65                 Heart cath on 6/26  jun 28             4.11  Sep 14 2017    5.32        Impression: 1  AKI on CKD3 - due to IV contrast in patient w diabetic CKD3. No uremic symptoms, no indication for acute HD yet.  Daily labs and supportive care. HD as needed. Hopefully will recover soon.  2  CKD 3- baseline creat is 1.8- 1.9,  urine prot:creat ration = 6.06 3  DM2 on trulicity now, long hx DM2 4  HTN on coreg, norvasc and bidil here. Home arb on hold.  5  STEMI - sp heart cath 6/26 w/ PCI to 100% OM2. Has residual LAD disease may need further PCI.   6  CHF - hx low EF 30-35% 7  Vol excess - moderate, wt's up, no resp symptoms. Got IV lasix last night , hold for now w/ rising creatinine.    Plan - as above   Kelly Splinter MD [facility] pager [phone]   09/15/2017, 2:55 PM   Recent Labs  Lab 09/11/17 1318  09/14/17 0243 09/15/17 0236  NA 139   < > 135 134*  K 3.2*   < > 3.6 3.4*  CL 104   < > 101 101  CO2 24   < > 20* 20*  GLUCOSE 160*   < > 131* 140*  BUN 30*   < > 46* 55*  CREATININE 2.65*   < > 5.32* 6.15*  CALCIUM 8.1*   < > 7.7* 7.4*  PHOS  --   --   --  4.5  ALBUMIN  --   --   --  1.6*  INR 1.12  --   --   --    < > = values in this interval not displayed.   No results for input(s): AST, ALT, ALKPHOS, BILITOT, PROT in the last 168 hours. Recent Labs  Lab 09/11/17 1318  09/13/17 0302 09/14/17 0243  WBC 10.5   < > 12.9* 11.2*  NEUTROABS 6.7  --   --   --   HGB 8.4*   < > 9.7* 9.2*  HCT 27.5*   < > 31.0* 29.3*  MCV 83.6   < > 84.0 82.8  PLT 331   < > 334 369   < > = values in this interval not displayed.   Iron/TIBC/Ferritin/ %Sat    Component Value Date/Time   IRON 19 (L) 07/24/2017 0725   TIBC 189 (L) 07/24/2017 0725   FERRITIN 61 07/24/2017 0725   IRONPCTSAT 10 (L) 07/24/2017 0725

## 2017-09-15 NOTE — Progress Notes (Signed)
Ref: Katherine Roan, MD   Subjective:  Increased urine output with lasix use per nephrology. BUN/Cr slightly up. Afebrile. Mildly hypokalemic.  Objective:  Vital Signs in the last 24 hours: Temp:  [97.2 F (36.2 C)-98.1 F (36.7 C)] 97.5 F (36.4 C) (06/30 0758) Pulse Rate:  [83-91] 90 (06/30 0800) Cardiac Rhythm: Normal sinus rhythm (06/30 0800) Resp:  [12-28] 18 (06/30 0800) BP: (107-144)/(62-95) 142/95 (06/30 0800) SpO2:  [94 %-100 %] 100 % (06/30 0800)  Physical Exam: BP Readings from Last 1 Encounters:  09/15/17 (!) 142/95     Wt Readings from Last 1 Encounters:  09/11/17 103.9 kg (229 lb 0.9 oz)    Weight change:  Body mass index is 29.41 kg/m. HEENT: [place]/AT, Eyes-Brown, PERL, EOMI, Conjunctiva-Pink, Sclera-Non-icteric Neck: No JVD, No bruit, Trachea midline. Lungs:  Clear, Bilateral. Cardiac:  Regular rhythm, normal S1 and S2, no S3. II/VI systolic murmur. Abdomen:  Soft, non-tender. BS present. Extremities:  Trace edema present. No cyanosis. No clubbing. CNS: AxOx3, Cranial nerves grossly intact, moves all 4 extremities.  Skin: Warm and dry.   Intake/Output from previous day: 06/29 0701 - 06/30 0700 In: 1536.7 [P.O.:540; I.V.:446.8; IV Piggyback:99.9] Out: 1550 [Urine:1550]    Lab Results: BMET    Component Value Date/Time   NA 134 (L) 09/15/2017 0236   NA 135 09/14/2017 0243   NA 137 09/13/2017 0302   NA 141 08/27/2017 1134   NA 143 08/21/2017 1352   NA 145 (H) 07/30/2017 1654   K 3.4 (L) 09/15/2017 0236   K 3.6 09/14/2017 0243   K 3.9 09/13/2017 0302   CL 101 09/15/2017 0236   CL 101 09/14/2017 0243   CL 104 09/13/2017 0302   CO2 20 (L) 09/15/2017 0236   CO2 20 (L) 09/14/2017 0243   CO2 21 (L) 09/13/2017 0302   GLUCOSE 140 (H) 09/15/2017 0236   GLUCOSE 131 (H) 09/14/2017 0243   GLUCOSE 158 (H) 09/13/2017 0302   BUN 55 (H) 09/15/2017 0236   BUN 46 (H) 09/14/2017 0243   BUN 40 (H) 09/13/2017 0302   BUN 32 (H) 08/27/2017 1134   BUN 36 (H)  08/21/2017 1352   BUN 26 (H) 07/30/2017 1654   CREATININE 6.15 (H) 09/15/2017 0236   CREATININE 5.32 (H) 09/14/2017 0243   CREATININE 4.11 (H) 09/13/2017 0302   CALCIUM 7.4 (L) 09/15/2017 0236   CALCIUM 7.7 (L) 09/14/2017 0243   CALCIUM 8.0 (L) 09/13/2017 0302   GFRNONAA 9 (L) 09/15/2017 0236   GFRNONAA 11 (L) 09/14/2017 0243   GFRNONAA 15 (L) 09/13/2017 0302   GFRAA 11 (L) 09/15/2017 0236   GFRAA 13 (L) 09/14/2017 0243   GFRAA 18 (L) 09/13/2017 0302   CBC    Component Value Date/Time   WBC 11.2 (H) 09/14/2017 0243   RBC 3.54 (L) 09/14/2017 0243   HGB 9.2 (L) 09/14/2017 0243   HCT 29.3 (L) 09/14/2017 0243   HCT 29.2 (L) 07/25/2017 0346   PLT 369 09/14/2017 0243   MCV 82.8 09/14/2017 0243   MCH 26.0 09/14/2017 0243   MCHC 31.4 09/14/2017 0243   RDW 15.2 09/14/2017 0243   LYMPHSABS 2.7 09/11/2017 1318   MONOABS 1.0 09/11/2017 1318   EOSABS 0.1 09/11/2017 1318   BASOSABS 0.0 09/11/2017 1318   HEPATIC Function Panel Recent Labs    07/22/17 1722  PROT 6.0*   HEMOGLOBIN A1C No components found for: HGA1C,  MPG CARDIAC ENZYMES Lab Results  Component Value Date   TROPONINI 5.28 ([place]) 09/14/2017  TROPONINI 4.56 (HH) 09/13/2017   TROPONINI 6.24 (HH) 09/12/2017   BNP No results for input(s): PROBNP in the last 8760 hours. TSH Recent Labs    07/24/17 0725 09/11/17 1904  TSH 0.698 1.120   CHOLESTEROL Recent Labs    09/12/17 0743  CHOL 181    Scheduled Meds: . amLODipine  5 mg Oral Daily  . aspirin EC  81 mg Oral Daily  . atorvastatin  80 mg Oral q1800  . carvedilol  6.25 mg Oral BID WC  . hydrALAZINE  25 mg Oral Q8H   And  . isosorbide dinitrate  20 mg Oral TID  . insulin aspart  0-9 Units Subcutaneous TID WC  . pantoprazole  40 mg Oral Q0600  . sodium chloride flush  3 mL Intravenous Q12H  . ticagrelor  90 mg Oral BID   Continuous Infusions: . sodium chloride 10 mL/hr at 09/15/17 0800  . sodium chloride    .  ceFAZolin (ANCEF) IV Stopped (09/14/17  2206)  . nitroGLYCERIN 5 mcg/min (09/12/17 1613)   PRN Meds:.sodium chloride, acetaminophen, ALPRAZolam, bacitracin, nitroGLYCERIN, ondansetron (ZOFRAN) IV, polyethylene glycol, sodium chloride flush  Assessment/Plan: Acute infero-lateral wall MI Multivessel CAD S/P PCI to OM 2 HTN Decompensated systolic left heart failure, improving Acute on chronic renal failure, worsened by contrast use Type 2 DM PVD  Appreciate renal consult. Continue medical management Potassium and magnesium supplement.   LOS: 4 days    Dixie Dials  MD  09/15/2017, 9:49 AM

## 2017-09-16 LAB — RENAL FUNCTION PANEL
ALBUMIN: 1.5 g/dL — AB (ref 3.5–5.0)
Anion gap: 12 (ref 5–15)
BUN: 55 mg/dL — ABNORMAL HIGH (ref 6–20)
CHLORIDE: 103 mmol/L (ref 98–111)
CO2: 22 mmol/L (ref 22–32)
Calcium: 7.6 mg/dL — ABNORMAL LOW (ref 8.9–10.3)
Creatinine, Ser: 5.75 mg/dL — ABNORMAL HIGH (ref 0.61–1.24)
GFR calc Af Amer: 12 mL/min — ABNORMAL LOW (ref >60)
GFR, EST NON AFRICAN AMERICAN: 10 mL/min — AB (ref >60)
GLUCOSE: 181 mg/dL — AB (ref 70–99)
PHOSPHORUS: 4.9 mg/dL — AB (ref 2.5–4.6)
Potassium: 3.8 mmol/L (ref 3.5–5.1)
SODIUM: 137 mmol/L (ref 135–145)

## 2017-09-16 LAB — BASIC METABOLIC PANEL
ANION GAP: 16 — AB (ref 5–15)
BUN: 56 mg/dL — ABNORMAL HIGH (ref 6–20)
CALCIUM: 7.8 mg/dL — AB (ref 8.9–10.3)
CO2: 19 mmol/L — ABNORMAL LOW (ref 22–32)
Chloride: 104 mmol/L (ref 98–111)
Creatinine, Ser: 5.48 mg/dL — ABNORMAL HIGH (ref 0.61–1.24)
GFR, EST AFRICAN AMERICAN: 13 mL/min — AB (ref >60)
GFR, EST NON AFRICAN AMERICAN: 11 mL/min — AB (ref >60)
GLUCOSE: 179 mg/dL — AB (ref 70–99)
Potassium: 4.2 mmol/L (ref 3.5–5.1)
SODIUM: 139 mmol/L (ref 135–145)

## 2017-09-16 LAB — GLUCOSE, CAPILLARY
GLUCOSE-CAPILLARY: 164 mg/dL — AB (ref 70–99)
Glucose-Capillary: 159 mg/dL — ABNORMAL HIGH (ref 70–99)
Glucose-Capillary: 159 mg/dL — ABNORMAL HIGH (ref 70–99)
Glucose-Capillary: 210 mg/dL — ABNORMAL HIGH (ref 70–99)

## 2017-09-16 LAB — MAGNESIUM
Magnesium: 2.2 mg/dL (ref 1.7–2.4)
Magnesium: 2.1 mg/dL (ref 1.7–2.4)

## 2017-09-16 MED ORDER — AMIODARONE IV BOLUS ONLY 150 MG/100ML
150.0000 mg | Freq: Once | INTRAVENOUS | Status: AC
  Administered 2017-09-17: 150 mg via INTRAVENOUS
  Filled 2017-09-16: qty 100

## 2017-09-16 MED ORDER — AMIODARONE HCL 200 MG PO TABS
200.0000 mg | ORAL_TABLET | Freq: Two times a day (BID) | ORAL | Status: DC
  Administered 2017-09-16 (×2): 200 mg via ORAL
  Filled 2017-09-16 (×2): qty 1

## 2017-09-16 MED ORDER — SODIUM CHLORIDE 0.9 % IV SOLN
INTRAVENOUS | Status: DC
  Administered 2017-09-16: 50 mL/h via INTRAVENOUS

## 2017-09-16 NOTE — Progress Notes (Signed)
Subjective:  Patient denies any chest pain, complaints of shortness of breath with minimal exertion.  Creatinine has started trending down.  Urine output has improved. Objective:  Vital Signs in the last 24 hours: Temp:  [97.4 F (36.3 C)-98.4 F (36.9 C)] 97.6 F (36.4 C) (07/01 0749) Pulse Rate:  [80-97] 97 (07/01 0800) Resp:  [11-25] 24 (07/01 0800) BP: (114-172)/(60-107) 172/107 (07/01 0800) SpO2:  [85 %-100 %] 99 % (07/01 0800) Weight:  [105.1 kg (231 lb 11.3 oz)-106.9 kg (235 lb 10.8 oz)] 105.1 kg (231 lb 11.3 oz) (07/01 0422)  Intake/Output from previous day: 06/30 0701 - 07/01 0700 In: 762.8 [P.O.:240; I.V.:420.1; IV Piggyback:102.7] Out: 2880 [Urine:2880] Intake/Output from this shift: Total I/O In: -  Out: 185 [Urine:185]  Physical Exam: Neck: no adenopathy, no carotid bruit, no JVD and supple, symmetrical, trachea midline Lungs: clear to auscultation bilaterally Heart: regular rate and rhythm, S1, S2 normal and soft systolic murmur noted Abdomen: soft, non-tender; bowel sounds normal; no masses,  no organomegaly Extremities: extremities normal, atraumatic, no cyanosis or edema  Lab Results: Recent Labs    09/14/17 0243  WBC 11.2*  HGB 9.2*  PLT 369   Recent Labs    09/15/17 0236 09/16/17 0239  NA 134* 137  K 3.4* 3.8  CL 101 103  CO2 20* 22  GLUCOSE 140* 181*  BUN 55* 55*  CREATININE 6.15* 5.75*   Recent Labs    09/14/17 0243  TROPONINI 5.28*   Hepatic Function Panel Recent Labs    09/16/17 0239  ALBUMIN 1.5*   No results for input(s): CHOL in the last 72 hours. No results for input(s): PROTIME in the last 72 hours.  Imaging: Imaging results have been reviewed and US Renal  Result Date: 09/14/2017 CLINICAL DATA:  Acute renal injury EXAM: RENAL / URINARY TRACT ULTRASOUND COMPLETE COMPARISON:  None. FINDINGS: Right Kidney: Length: 12.6 cm. Echogenicity within normal limits. No mass or hydronephrosis visualized. Left Kidney: Length: 12.9 cm.  Echogenicity within normal limits. No mass or hydronephrosis visualized. Bladder: Suboptimally assessed due to empty bladder. IMPRESSION: Maintained cortical-medullary distinction about both kidneys. No obstructive uropathy, mass or nephrolithiasis. Electronically Signed   By: Ashley Royalty M.D.   On: 09/14/2017 21:18    Cardiac Studies:  Assessment/Plan:  Acute inferolateral wall myocardial infarction with late presentation Status post PCI to OM 2 Hypertensive heart disease with systolic dysfunction Decompensated systolic congestive heart failure Insulin requiring diabetes mellitus History of right CVA with left paresis Tobacco abuse quit last year Acute on chronicChronic kidney disease stage IV secondary to contrast Acute on chronic anemia secondary to hydration doubt GI loss Family history of coronary artery disease Plan Continue present management. Monitor renal function. We'll cancel PCI for tomorrow.  In view of significantly worsened renal function, will schedule for later this week if renal function improves and stable   LOS: 5 days    Charolette Forward 09/16/2017, 10:29 AM

## 2017-09-16 NOTE — Progress Notes (Signed)
S: Feels well O:BP 137/90 (BP Location: Right Arm)   Pulse 92   Temp 97.6 F (36.4 C) (Oral)   Resp 19   Ht 6\' 2"  (1.88 m)   Wt 105.1 kg (231 lb 11.3 oz)   SpO2 100%   BMI 29.75 kg/m   Intake/Output Summary (Last 24 hours) at 09/16/2017 1251 Last data filed at 09/16/2017 1207 Gross per 24 hour  Intake 713.9 ml  Output 2815 ml  Net -2101.1 ml   Intake/Output: I/O last 3 completed shifts: In: 1249.7 [P.O.:540; I.V.:557; IV Piggyback:152.7] Out: 4030 [Urine:4030]  Intake/Output this shift:  Total I/O In: 9.2 [I.V.:9.2] Out: 185 [Urine:185] Weight change:  Gen: NAD CVS: no rub Resp: cta Abd: benign Ext: 1+ edema  Recent Labs  Lab 09/11/17 1318 09/12/17 0743 09/13/17 0302 09/14/17 0243 09/15/17 0236 09/16/17 0239  NA 139 138 137 135 134* 137  K 3.2* 3.3* 3.9 3.6 3.4* 3.8  CL 104 104 104 101 101 103  CO2 24 21* 21* 20* 20* 22  GLUCOSE 160* 133* 158* 131* 140* 181*  BUN 30* 33* 40* 46* 55* 55*  CREATININE 2.65* 2.85* 4.11* 5.32* 6.15* 5.75*  ALBUMIN  --   --   --   --  1.6* 1.5*  CALCIUM 8.1* 7.7* 8.0* 7.7* 7.4* 7.6*  PHOS  --   --   --   --  4.5 4.9*   Liver Function Tests: Recent Labs  Lab 09/15/17 0236 09/16/17 0239  ALBUMIN 1.6* 1.5*   No results for input(s): LIPASE, AMYLASE in the last 168 hours. No results for input(s): AMMONIA in the last 168 hours. CBC: Recent Labs  Lab 09/11/17 1318 09/12/17 0743 09/13/17 0302 09/14/17 0243  WBC 10.5 11.3* 12.9* 11.2*  NEUTROABS 6.7  --   --   --   HGB 8.4* 7.7* 9.7* 9.2*  HCT 27.5* 24.9* 31.0* 29.3*  MCV 83.6 82.2 84.0 82.8  PLT 331 341 334 369   Cardiac Enzymes: Recent Labs  Lab 09/11/17 2021 09/12/17 0743 09/12/17 1836 09/13/17 0844 09/14/17 0243  TROPONINI 6.24* 5.46* 6.24* 4.56* 5.28*   CBG: Recent Labs  Lab 09/15/17 1223 09/15/17 1636 09/15/17 2139 09/16/17 0626 09/16/17 1204  GLUCAP 204* 195* 145* 159* 164*    Iron Studies: No results for input(s): IRON, TIBC, TRANSFERRIN, FERRITIN  in the last 72 hours. Studies/Results: US Renal  Result Date: 09/14/2017 CLINICAL DATA:  Acute renal injury EXAM: RENAL / URINARY TRACT ULTRASOUND COMPLETE COMPARISON:  None. FINDINGS: Right Kidney: Length: 12.6 cm. Echogenicity within normal limits. No mass or hydronephrosis visualized. Left Kidney: Length: 12.9 cm. Echogenicity within normal limits. No mass or hydronephrosis visualized. Bladder: Suboptimally assessed due to empty bladder. IMPRESSION: Maintained cortical-medullary distinction about both kidneys. No obstructive uropathy, mass or nephrolithiasis. Electronically Signed   By: Ashley Royalty M.D.   On: 09/14/2017 21:18   . amLODipine  5 mg Oral Daily  . aspirin EC  81 mg Oral Daily  . atorvastatin  80 mg Oral q1800  . carvedilol  6.25 mg Oral BID WC  . hydrALAZINE  25 mg Oral Q8H   And  . isosorbide dinitrate  20 mg Oral TID  . insulin aspart  0-9 Units Subcutaneous TID WC  . pantoprazole  40 mg Oral Q0600  . sodium chloride flush  3 mL Intravenous Q12H  . ticagrelor  90 mg Oral BID    BMET    Component Value Date/Time   NA 137 09/16/2017 0239   NA 141  08/27/2017 1134   K 3.8 09/16/2017 0239   CL 103 09/16/2017 0239   CO2 22 09/16/2017 0239   GLUCOSE 181 (H) 09/16/2017 0239   BUN 55 (H) 09/16/2017 0239   BUN 32 (H) 08/27/2017 1134   CREATININE 5.75 (H) 09/16/2017 0239   CALCIUM 7.6 (L) 09/16/2017 0239   GFRNONAA 10 (L) 09/16/2017 0239   GFRAA 12 (L) 09/16/2017 0239   CBC    Component Value Date/Time   WBC 11.2 (H) 09/14/2017 0243   RBC 3.54 (L) 09/14/2017 0243   HGB 9.2 (L) 09/14/2017 0243   HCT 29.3 (L) 09/14/2017 0243   HCT 29.2 (L) 07/25/2017 0346   PLT 369 09/14/2017 0243   MCV 82.8 09/14/2017 0243   MCH 26.0 09/14/2017 0243   MCHC 31.4 09/14/2017 0243   RDW 15.2 09/14/2017 0243   LYMPHSABS 2.7 09/11/2017 1318   MONOABS 1.0 09/11/2017 1318   EOSABS 0.1 09/11/2017 1318   BASOSABS 0.0 09/11/2017 1318     Assessment/Plan:  1. AKI/CKD stage 3 due to  IV contrast with underlying diabetic nephropathy.  Cr peaked at 6.15 but improving to 5.75.  Agree with holding off on another cath for now due to elevated Cr.  Would wait until it returns closer to baseline as long as he remains CP free. 2. Multi-vessel CAD s/p acute Inferolateral MI s/p DES to OM2.  Also with LAD lesions 80 and 85%, prox and mid respectively.  Hold off on another cath as above 3. Anemia of CKD stage 3- check iron stores and consider ESA 4. DM- per primary 5. HTN- stable 6. CHF- diuresed well, follow I's/O's and daily creatinine.  Donetta Potts, MD [employer] [phone]

## 2017-09-17 ENCOUNTER — Inpatient Hospital Stay (HOSPITAL_COMMUNITY): Payer: Medicare Other

## 2017-09-17 ENCOUNTER — Inpatient Hospital Stay (HOSPITAL_COMMUNITY)
Admission: EM | Disposition: A | Discharge status: Skilled Nursing Facility | Payer: Self-pay | Source: Home / Self Care | Attending: Cardiology

## 2017-09-17 ENCOUNTER — Encounter (HOSPITAL_COMMUNITY): Payer: Self-pay | Admitting: Cardiology

## 2017-09-17 DIAGNOSIS — N179 Acute kidney failure, unspecified: Secondary | ICD-10-CM

## 2017-09-17 DIAGNOSIS — I5043 Acute on chronic combined systolic (congestive) and diastolic (congestive) heart failure: Secondary | ICD-10-CM | POA: Diagnosis present

## 2017-09-17 DIAGNOSIS — I4729 Other ventricular tachycardia: Secondary | ICD-10-CM

## 2017-09-17 DIAGNOSIS — R0602 Shortness of breath: Secondary | ICD-10-CM

## 2017-09-17 DIAGNOSIS — I2119 ST elevation (STEMI) myocardial infarction involving other coronary artery of inferior wall: Principal | ICD-10-CM

## 2017-09-17 DIAGNOSIS — J9601 Acute respiratory failure with hypoxia: Secondary | ICD-10-CM | POA: Diagnosis not present

## 2017-09-17 DIAGNOSIS — I472 Ventricular tachycardia: Secondary | ICD-10-CM

## 2017-09-17 DIAGNOSIS — E8729 Other acidosis: Secondary | ICD-10-CM | POA: Diagnosis not present

## 2017-09-17 DIAGNOSIS — E872 Acidosis: Secondary | ICD-10-CM | POA: Diagnosis not present

## 2017-09-17 DIAGNOSIS — N184 Chronic kidney disease, stage 4 (severe): Secondary | ICD-10-CM

## 2017-09-17 DIAGNOSIS — I5020 Unspecified systolic (congestive) heart failure: Secondary | ICD-10-CM

## 2017-09-17 HISTORY — PX: CORONARY STENT INTERVENTION: CATH118234

## 2017-09-17 HISTORY — PX: IABP INSERTION: CATH118242

## 2017-09-17 LAB — COMPREHENSIVE METABOLIC PANEL
ALBUMIN: 1.7 g/dL — AB (ref 3.5–5.0)
ALT: 6 U/L (ref 0–44)
ANION GAP: 16 — AB (ref 5–15)
AST: 31 U/L (ref 15–41)
Alkaline Phosphatase: 195 U/L — ABNORMAL HIGH (ref 38–126)
BILIRUBIN TOTAL: 0.7 mg/dL (ref 0.3–1.2)
BUN: 51 mg/dL — ABNORMAL HIGH (ref 6–20)
CO2: 15 mmol/L — ABNORMAL LOW (ref 22–32)
Calcium: 7 mg/dL — ABNORMAL LOW (ref 8.9–10.3)
Chloride: 107 mmol/L (ref 98–111)
Creatinine, Ser: 4.87 mg/dL — ABNORMAL HIGH (ref 0.61–1.24)
GFR, EST AFRICAN AMERICAN: 14 mL/min — AB (ref >60)
GFR, EST NON AFRICAN AMERICAN: 12 mL/min — AB (ref >60)
Glucose, Bld: 182 mg/dL — ABNORMAL HIGH (ref 70–99)
Potassium: 4 mmol/L (ref 3.5–5.1)
Sodium: 138 mmol/L (ref 135–145)
TOTAL PROTEIN: 5.5 g/dL — AB (ref 6.5–8.1)

## 2017-09-17 LAB — CBC
HCT: 28.3 % — ABNORMAL LOW (ref 39.0–52.0)
HEMATOCRIT: 27 % — AB (ref 39.0–52.0)
HEMATOCRIT: 31.7 % — AB (ref 39.0–52.0)
HEMOGLOBIN: 8.6 g/dL — AB (ref 13.0–17.0)
HEMOGLOBIN: 9 g/dL — AB (ref 13.0–17.0)
HEMOGLOBIN: 9.9 g/dL — AB (ref 13.0–17.0)
MCH: 26.4 pg (ref 26.0–34.0)
MCH: 26.5 pg (ref 26.0–34.0)
MCH: 26.6 pg (ref 26.0–34.0)
MCHC: 31.2 g/dL (ref 30.0–36.0)
MCHC: 31.8 g/dL (ref 30.0–36.0)
MCHC: 31.9 g/dL (ref 30.0–36.0)
MCV: 82.8 fL (ref 78.0–100.0)
MCV: 83.7 fL (ref 78.0–100.0)
MCV: 84.8 fL (ref 78.0–100.0)
Platelets: 391 10*3/uL (ref 150–400)
Platelets: 393 10*3/uL (ref 150–400)
Platelets: 398 10*3/uL (ref 150–400)
RBC: 3.26 MIL/uL — AB (ref 4.22–5.81)
RBC: 3.38 MIL/uL — AB (ref 4.22–5.81)
RBC: 3.74 MIL/uL — ABNORMAL LOW (ref 4.22–5.81)
RDW: 16.2 % — ABNORMAL HIGH (ref 11.5–15.5)
RDW: 16.3 % — ABNORMAL HIGH (ref 11.5–15.5)
RDW: 16.4 % — ABNORMAL HIGH (ref 11.5–15.5)
WBC: 12.2 10*3/uL — ABNORMAL HIGH (ref 4.0–10.5)
WBC: 13.1 10*3/uL — AB (ref 4.0–10.5)
WBC: 9 10*3/uL (ref 4.0–10.5)

## 2017-09-17 LAB — POCT ACTIVATED CLOTTING TIME
ACTIVATED CLOTTING TIME: 202 s
ACTIVATED CLOTTING TIME: 158 s
Activated Clotting Time: 340 seconds
Activated Clotting Time: 213 seconds
Activated Clotting Time: 169 seconds
Activated Clotting Time: 142 seconds
Activated Clotting Time: 175 seconds

## 2017-09-17 LAB — BASIC METABOLIC PANEL
ANION GAP: 10 (ref 5–15)
ANION GAP: 12 (ref 5–15)
BUN: 55 mg/dL — ABNORMAL HIGH (ref 6–20)
BUN: 53 mg/dL — AB (ref 6–20)
CHLORIDE: 104 mmol/L (ref 98–111)
CO2: 21 mmol/L — ABNORMAL LOW (ref 22–32)
CO2: 22 mmol/L (ref 22–32)
Calcium: 7.1 mg/dL — ABNORMAL LOW (ref 8.9–10.3)
Calcium: 7.6 mg/dL — ABNORMAL LOW (ref 8.9–10.3)
Chloride: 107 mmol/L (ref 98–111)
Creatinine, Ser: 5.22 mg/dL — ABNORMAL HIGH (ref 0.61–1.24)
Creatinine, Ser: 4.86 mg/dL — ABNORMAL HIGH (ref 0.61–1.24)
GFR calc Af Amer: 15 mL/min — ABNORMAL LOW (ref >60)
GFR calc non Af Amer: 13 mL/min — ABNORMAL LOW (ref >60)
GFR, EST AFRICAN AMERICAN: 13 mL/min — AB (ref >60)
GFR, EST NON AFRICAN AMERICAN: 11 mL/min — AB (ref >60)
GLUCOSE: 218 mg/dL — AB (ref 70–99)
Glucose, Bld: 169 mg/dL — ABNORMAL HIGH (ref 70–99)
POTASSIUM: 3.7 mmol/L (ref 3.5–5.1)
Potassium: 4.1 mmol/L (ref 3.5–5.1)
Sodium: 138 mmol/L (ref 135–145)
Sodium: 138 mmol/L (ref 135–145)

## 2017-09-17 LAB — POCT I-STAT 3, ART BLOOD GAS (G3+)
Acid-base deficit: 9 mmol/L — ABNORMAL HIGH (ref 0.0–2.0)
BICARBONATE: 15.6 mmol/L — AB (ref 20.0–28.0)
O2 Saturation: 100 %
PCO2 ART: 27.6 mmHg — AB (ref 32.0–48.0)
PH ART: 7.357 (ref 7.350–7.450)
TCO2: 16 mmol/L — ABNORMAL LOW (ref 22–32)
pO2, Arterial: 257 mmHg — ABNORMAL HIGH (ref 83.0–108.0)

## 2017-09-17 LAB — FERRITIN: FERRITIN: 235 ng/mL (ref 24–336)

## 2017-09-17 LAB — GLUCOSE, CAPILLARY
GLUCOSE-CAPILLARY: 214 mg/dL — AB (ref 70–99)
GLUCOSE-CAPILLARY: 204 mg/dL — AB (ref 70–99)
GLUCOSE-CAPILLARY: 160 mg/dL — AB (ref 70–99)
Glucose-Capillary: 152 mg/dL — ABNORMAL HIGH (ref 70–99)
Glucose-Capillary: 187 mg/dL — ABNORMAL HIGH (ref 70–99)

## 2017-09-17 LAB — MAGNESIUM: MAGNESIUM: 2 mg/dL (ref 1.7–2.4)

## 2017-09-17 LAB — IRON AND TIBC
IRON: 19 ug/dL — AB (ref 45–182)
Saturation Ratios: 11 % — ABNORMAL LOW (ref 17.9–39.5)
TIBC: 169 ug/dL — ABNORMAL LOW (ref 250–450)
UIBC: 150 ug/dL

## 2017-09-17 LAB — LACTIC ACID, PLASMA
LACTIC ACID, VENOUS: 1.2 mmol/L (ref 0.5–1.9)
LACTIC ACID, VENOUS: 2.9 mmol/L — AB (ref 0.5–1.9)
Lactic Acid, Venous: 2.1 mmol/L (ref 0.5–1.9)

## 2017-09-17 LAB — ECHOCARDIOGRAM COMPLETE
HEIGHTINCHES: 74 in
WEIGHTICAEL: 3707.26 [oz_av]

## 2017-09-17 LAB — TROPONIN I
TROPONIN I: 1.97 ng/mL — AB (ref <0.03)
TROPONIN I: 2.36 ng/mL — AB (ref <0.03)
TROPONIN I: 2.53 ng/mL — AB (ref <0.03)
Troponin I: 2.62 ng/mL (ref <0.03)

## 2017-09-17 LAB — HEPARIN LEVEL (UNFRACTIONATED): Heparin Unfractionated: 0.28 IU/mL — ABNORMAL LOW (ref 0.30–0.70)

## 2017-09-17 SURGERY — CORONARY STENT INTERVENTION
Anesthesia: LOCAL

## 2017-09-17 MED ORDER — NITROGLYCERIN IN D5W 200-5 MCG/ML-% IV SOLN
5.0000 ug/min | INTRAVENOUS | Status: DC
  Administered 2017-09-17: 5 ug/min via INTRAVENOUS
  Filled 2017-09-17: qty 250

## 2017-09-17 MED ORDER — AMIODARONE HCL IN DEXTROSE 360-4.14 MG/200ML-% IV SOLN
30.0000 mg/h | INTRAVENOUS | Status: DC
  Administered 2017-09-17 – 2017-09-20 (×8): 30 mg/h via INTRAVENOUS
  Filled 2017-09-17 (×7): qty 200

## 2017-09-17 MED ORDER — SODIUM BICARBONATE 8.4 % IV SOLN
50.0000 meq | Freq: Once | INTRAVENOUS | Status: AC
Start: 2017-09-17 — End: 2017-09-17
  Administered 2017-09-17: 50 meq via INTRAVENOUS

## 2017-09-17 MED ORDER — ASPIRIN 81 MG PO CHEW: 81.0000 mg | CHEWABLE_TABLET | ORAL | Status: DC

## 2017-09-17 MED ORDER — ONDANSETRON HCL 4 MG/2ML IJ SOLN
4.0000 mg | Freq: Four times a day (QID) | INTRAMUSCULAR | Status: DC | PRN
  Filled 2017-09-17: qty 2

## 2017-09-17 MED ORDER — MIDAZOLAM HCL 2 MG/2ML IJ SOLN
INTRAMUSCULAR | Status: AC
Start: 2017-09-17 — End: ?
  Filled 2017-09-17: qty 2

## 2017-09-17 MED ORDER — SODIUM CHLORIDE 0.9 % IV SOLN
INTRAVENOUS | Status: DC | PRN
  Administered 2017-09-17: 0.25 mg/kg/h via INTRAVENOUS

## 2017-09-17 MED ORDER — CHLORHEXIDINE GLUCONATE 4 % EX LIQD
CUTANEOUS | Status: AC
  Filled 2017-09-17: qty 15

## 2017-09-17 MED ORDER — ATROPINE SULFATE 1 MG/10ML IJ SOSY
PREFILLED_SYRINGE | INTRAMUSCULAR | Status: AC
  Filled 2017-09-17: qty 10

## 2017-09-17 MED ORDER — HEPARIN (PORCINE) IN NACL 100-0.45 UNIT/ML-% IJ SOLN
1550.0000 [IU]/h | INTRAMUSCULAR | Status: DC
  Administered 2017-09-17: 1400 [IU]/h via INTRAVENOUS
  Filled 2017-09-17: qty 250

## 2017-09-17 MED ORDER — IOPAMIDOL (ISOVUE-370) INJECTION 76%
INTRAVENOUS | Status: DC | PRN
  Administered 2017-09-17: 80 mL via INTRA_ARTERIAL

## 2017-09-17 MED ORDER — SODIUM BICARBONATE 8.4 % IV SOLN
INTRAVENOUS | Status: DC
  Administered 2017-09-17 (×3): via INTRAVENOUS
  Filled 2017-09-17 (×3): qty 150

## 2017-09-17 MED ORDER — SODIUM CHLORIDE 0.9% FLUSH: 3.0000 mL | INTRAVENOUS | Status: DC | PRN

## 2017-09-17 MED ORDER — SODIUM CHLORIDE 0.9% FLUSH
3.0000 mL | Freq: Two times a day (BID) | INTRAVENOUS | Status: DC
  Administered 2017-09-17 – 2017-09-25 (×14): 3 mL via INTRAVENOUS

## 2017-09-17 MED ORDER — INSULIN ASPART 100 UNIT/ML ~~LOC~~ SOLN
0.0000 [IU] | SUBCUTANEOUS | Status: DC
  Administered 2017-09-17 (×3): 3 [IU] via SUBCUTANEOUS
  Administered 2017-09-17 – 2017-09-18 (×3): 5 [IU] via SUBCUTANEOUS
  Administered 2017-09-18 (×5): 3 [IU] via SUBCUTANEOUS
  Administered 2017-09-19: 5 [IU] via SUBCUTANEOUS
  Administered 2017-09-19: 2 [IU] via SUBCUTANEOUS
  Administered 2017-09-19: 5 [IU] via SUBCUTANEOUS
  Administered 2017-09-19: 3 [IU] via SUBCUTANEOUS
  Administered 2017-09-19: 2 [IU] via SUBCUTANEOUS
  Administered 2017-09-20: 3 [IU] via SUBCUTANEOUS
  Administered 2017-09-20: 2 [IU] via SUBCUTANEOUS
  Administered 2017-09-20 (×2): 5 [IU] via SUBCUTANEOUS
  Administered 2017-09-20: 3 [IU] via SUBCUTANEOUS
  Administered 2017-09-21 (×2): 2 [IU] via SUBCUTANEOUS
  Administered 2017-09-21: 3 [IU] via SUBCUTANEOUS
  Administered 2017-09-21: 5 [IU] via SUBCUTANEOUS
  Administered 2017-09-22: 2 [IU] via SUBCUTANEOUS
  Administered 2017-09-22: 5 [IU] via SUBCUTANEOUS
  Administered 2017-09-22: 3 [IU] via SUBCUTANEOUS
  Administered 2017-09-22 – 2017-09-23 (×4): 2 [IU] via SUBCUTANEOUS
  Administered 2017-09-23: 3 [IU] via SUBCUTANEOUS
  Administered 2017-09-23: 5 [IU] via SUBCUTANEOUS
  Administered 2017-09-23 – 2017-09-24 (×2): 3 [IU] via SUBCUTANEOUS
  Administered 2017-09-24: 2 [IU] via SUBCUTANEOUS
  Administered 2017-09-24: 5 [IU] via SUBCUTANEOUS
  Administered 2017-09-24: 2 [IU] via SUBCUTANEOUS
  Administered 2017-09-25: 3 [IU] via SUBCUTANEOUS
  Administered 2017-09-25: 2 [IU] via SUBCUTANEOUS

## 2017-09-17 MED ORDER — HEPARIN SODIUM (PORCINE) 1000 UNIT/ML IJ SOLN
INTRAMUSCULAR | Status: DC | PRN
  Administered 2017-09-17: 3000 [IU] via INTRAVENOUS

## 2017-09-17 MED ORDER — FENTANYL CITRATE (PF) 100 MCG/2ML IJ SOLN
INTRAMUSCULAR | Status: DC | PRN
  Administered 2017-09-17 (×2): 25 ug via INTRAVENOUS

## 2017-09-17 MED ORDER — SODIUM CHLORIDE 0.9 % IV SOLN: 250.0000 mL | INTRAVENOUS | Status: DC | PRN

## 2017-09-17 MED ORDER — BIVALIRUDIN BOLUS VIA INFUSION - CUPID
INTRAVENOUS | Status: DC | PRN
  Administered 2017-09-17: 78.825 mg via INTRAVENOUS

## 2017-09-17 MED ORDER — NITROGLYCERIN 1 MG/10 ML FOR IR/CATH LAB
INTRA_ARTERIAL | Status: DC | PRN
  Administered 2017-09-17: 100 ug via INTRACORONARY

## 2017-09-17 MED ORDER — FENTANYL CITRATE (PF) 100 MCG/2ML IJ SOLN
INTRAMUSCULAR | Status: AC
  Filled 2017-09-17: qty 2

## 2017-09-17 MED ORDER — BIVALIRUDIN TRIFLUOROACETATE 250 MG IV SOLR
INTRAVENOUS | Status: AC
  Filled 2017-09-17: qty 250

## 2017-09-17 MED ORDER — HEPARIN (PORCINE) IN NACL 1000-0.9 UT/500ML-% IV SOLN
INTRAVENOUS | Status: AC
  Filled 2017-09-17: qty 1000

## 2017-09-17 MED ORDER — LIDOCAINE HCL (PF) 1 % IJ SOLN
INTRAMUSCULAR | Status: AC
  Filled 2017-09-17: qty 30

## 2017-09-17 MED ORDER — SODIUM CHLORIDE 0.9% FLUSH: 3.0000 mL | Freq: Two times a day (BID) | INTRAVENOUS | Status: DC

## 2017-09-17 MED ORDER — HEPARIN (PORCINE) IN NACL 2-0.9 UNITS/ML
INTRAMUSCULAR | Status: DC | PRN
  Administered 2017-09-17 (×2): 500 mL

## 2017-09-17 MED ORDER — AMIODARONE HCL IN DEXTROSE 360-4.14 MG/200ML-% IV SOLN
60.0000 mg/h | INTRAVENOUS | Status: DC
  Administered 2017-09-17: 60 mg/h via INTRAVENOUS
  Filled 2017-09-17: qty 200

## 2017-09-17 MED ORDER — LIDOCAINE HCL (PF) 1 % IJ SOLN
INTRAMUSCULAR | Status: DC | PRN
  Administered 2017-09-17: 25 mL

## 2017-09-17 MED ORDER — NITROGLYCERIN 1 MG/10 ML FOR IR/CATH LAB
INTRA_ARTERIAL | Status: AC
Start: 2017-09-17 — End: ?
  Filled 2017-09-17: qty 10

## 2017-09-17 MED ORDER — SODIUM CHLORIDE 0.9 % IV SOLN: INTRAVENOUS | Status: DC

## 2017-09-17 MED ORDER — SODIUM CHLORIDE 0.9 % IV SOLN
250.0000 mL | INTRAVENOUS | Status: DC | PRN
  Administered 2017-09-17: 10 mL via INTRAVENOUS
  Administered 2017-09-18 – 2017-09-20 (×2): 250 mL via INTRAVENOUS

## 2017-09-17 MED ORDER — MIDAZOLAM HCL 2 MG/2ML IJ SOLN
INTRAMUSCULAR | Status: DC | PRN
  Administered 2017-09-17: 1 mg via INTRAVENOUS

## 2017-09-17 MED ORDER — LIDOCAINE IN D5W 4-5 MG/ML-% IV SOLN
1.0000 mg/min | INTRAVENOUS | Status: DC
  Administered 2017-09-17 (×3): 1 mg/min via INTRAVENOUS
  Filled 2017-09-17: qty 500

## 2017-09-17 MED ORDER — IOPAMIDOL (ISOVUE-370) INJECTION 76%
INTRAVENOUS | Status: AC
  Filled 2017-09-17: qty 125

## 2017-09-17 MED ORDER — FENTANYL CITRATE (PF) 100 MCG/2ML IJ SOLN
25.0000 ug | INTRAMUSCULAR | Status: DC | PRN
  Administered 2017-09-17 – 2017-09-18 (×7): 50 ug via INTRAVENOUS
  Filled 2017-09-17 (×7): qty 2

## 2017-09-17 MED ORDER — CHLORHEXIDINE GLUCONATE 4 % EX LIQD
Freq: Once | CUTANEOUS | Status: AC
  Administered 2017-09-17: 11:00:00 via TOPICAL

## 2017-09-17 SURGICAL SUPPLY — 20 items
BALLN EMERGE MR 2.5X12 (BALLOONS) ×2
BALLN IABP SENSA PLUS 8F 50CC (BALLOONS) ×2
BALLN ~~LOC~~ EMERGE MR 3.5X20 (BALLOONS) ×2
BALLOON EMERGE MR 2.5X12 (BALLOONS) ×1 IMPLANT
BALLOON IABP SENS PLUS 8F 50CC (BALLOONS) ×1 IMPLANT
BALLOON ~~LOC~~ EMERGE MR 3.5X20 (BALLOONS) ×1 IMPLANT
CATH VISTA GUIDE 6FR XB3 (CATHETERS) ×2 IMPLANT
ELECT DEFIB PAD ADLT CADENCE (PAD) ×4 IMPLANT
HOVERMATT SINGLE USE (MISCELLANEOUS) ×2 IMPLANT
KIT ENCORE 26 ADVANTAGE (KITS) ×4 IMPLANT
KIT HEART LEFT (KITS) ×2 IMPLANT
PACK CARDIAC CATHETERIZATION (CUSTOM PROCEDURE TRAY) ×2 IMPLANT
SHEATH PINNACLE 5F 10CM (SHEATH) ×2 IMPLANT
SHEATH PINNACLE 6F 10CM (SHEATH) ×4 IMPLANT
STENT SIERRA 2.75 X 18 MM (Permanent Stent) ×2 IMPLANT
STENT SIERRA 3.25 X 38 MM (Permanent Stent) ×2 IMPLANT
TRANSDUCER W/STOPCOCK (MISCELLANEOUS) ×2 IMPLANT
TUBING CIL FLEX 10 FLL-RA (TUBING) ×2 IMPLANT
WIRE EMERALD 3MM-J .035X150CM (WIRE) ×2 IMPLANT
WIRE PT2 MS 185 (WIRE) ×2 IMPLANT

## 2017-09-17 NOTE — Progress Notes (Signed)
Angiomax gtt stopped at 1500. RN will assess ACT at 1700 per protocol/orders.

## 2017-09-17 NOTE — Progress Notes (Signed)
Pt called out c/o of pain. This RN to the bedside to assess. Pt requesting to be turned d/t lower back pain. RN asked if pain was new, Pt verbalized yes. RN assessed IABP site for bleeding, site oozing but level 0 with no hematoma palpated. RN palpated right lower back/flank pt verbalized pain worse upon palpation. MD notified. MD stated " site was a clean stick, so no complications are expected." VS stables. No new orders given. Will continue to monitor and assess pt closely.

## 2017-09-17 NOTE — Progress Notes (Signed)
[facility] for heparin Indication: MI/CAD  Allergies  Allergen Reactions  . Tomato Rash    Patient Measurements: Height: 6\' 2"  (188 cm) Weight: 231 lb 11.3 oz (105.1 kg) IBW/kg (Calculated) : 82.2 Heparin Dosing Weight: 100kg  Vital Signs: Temp: 97.3 F (36.3 C) (07/02 0738) Temp Source: Oral (07/02 0738) BP: 103/64 (07/02 0700) Pulse Rate: 65 (07/02 0700)  Labs: Recent Labs    09/16/17 1550 09/17/17 0042 09/17/17 0153 09/17/17 0407 09/17/17 0834  HGB  --  9.9*  --  9.0*  --   HCT  --  31.7*  --  28.3*  --   PLT  --  398  --  393  --   HEPARINUNFRC  --   --   --   --  0.28*  CREATININE 5.48* 4.87*  --  5.22*  --   TROPONINI  --  2.36* 1.97*  --   --     Estimated Creatinine Clearance: 21.4 mL/min (A) (by C-G formula based on SCr of 5.22 mg/dL (H)).   Medical History: Past Medical History:  Diagnosis Date  . CHF (congestive heart failure) ([place])   . CVA (cerebral vascular accident) ([place])   . Heart failure ([place])   . Hypertension   . Status post CVA   . T2DM (type 2 diabetes mellitus) ([place])     Medications:  Medications Prior to Admission  Medication Sig Dispense Refill Last Dose  . atorvastatin (LIPITOR) 40 MG tablet Take 1 tablet (40 mg total) by mouth daily at 6 PM. 30 tablet 2 09/10/2017 at Unknown time  . bacitracin ointment Apply topically as needed for wound care. 120 g 0 09/10/2017 at prn  . carvedilol (COREG) 25 MG tablet Take 1 tablet (25 mg total) by mouth 2 (two) times daily with a meal. 180 tablet 2 09/11/2017 at 0630  . Dulaglutide (TRULICITY) 5.00 XF/8.1WE SOPN Inject 0.75 mg into the skin once a week. 4 pen 3 09/10/2017  . famotidine (PEPCID) 10 MG tablet Take 1 tablet (10 mg total) by mouth daily. 30 tablet 0 09/11/2017 at Unknown time  . furosemide (LASIX) 40 MG tablet Take 1 tablet (40 mg total) by mouth daily. 60 tablet 0 09/11/2017 at Unknown time  . losartan (COZAAR) 50 MG tablet Take 1 tablet (50 mg total)  by mouth daily. 90 tablet 0 09/11/2017 at Unknown time  . nicotine (NICODERM CQ - DOSED IN MG/24 HOURS) 14 mg/24hr patch Place 1 patch (14 mg total) onto the skin daily. 28 patch 0 09/11/2017 at Unknown time  . traMADol (ULTRAM) 50 MG tablet Take 1 tablet (50 mg total) by mouth every 12 (twelve) hours as needed for moderate pain or severe pain. 20 tablet 0 09/08/2017 at prn   Scheduled:  . aspirin EC  81 mg Oral Daily  . atorvastatin  80 mg Oral q1800  . carvedilol  6.25 mg Oral BID WC  . insulin aspart  0-15 Units Subcutaneous Q4H  . pantoprazole  40 mg Oral Q0600  . sodium chloride flush  3 mL Intravenous Q12H  . ticagrelor  90 mg Oral BID   Infusions:  . sodium chloride 10 mL/hr (09/16/17 0617)  . amiodarone 30 mg/hr (09/17/17 0616)  .  ceFAZolin (ANCEF) IV Stopped (09/17/17 0412)  . heparin 1,400 Units/hr (09/17/17 0600)  . lidocaine    .  sodium bicarbonate  infusion 1000 mL Stopped (09/17/17 0558)    Assessment: 52yo male admitted w/ acute anferolateral wall  MI w/ late presentation, went to cath lab > stent to 100% occluded OM2 but requires planned PCI which has been on hold for AKI, overnight went into Vtach arrest > ROSC after epi x1, now to begin heparin.  Initial heparin level just below goal at 0.28, no bleeding issues noted. Will titrate rate. Recheck labs in am.   Goal of Therapy:  Heparin level 0.3-0.7 units/ml Monitor platelets by anticoagulation protocol: Yes   Plan:  Increase IV heparin to 1550 units/hr Daily heparin level and cbc  Erin Hearing PharmD., BCPS Clinical Pharmacist 09/17/2017 9:26 AM

## 2017-09-17 NOTE — H&P (View-Only) (Signed)
Subjective:  Patient complains of vague left-sided chest pain without associated symptoms had sustained V. Tach arrest last night requiring CPR and defibrillation cardiac enzymes were trending down but again are elevated this morning. Renal function also trending up patient having frequent bigeminy and couplets on the monitor despite being on IV amiodarone and IV lidocaine was started now.    Objective:  Vital Signs in the last 24 hours: Temp:  [97.3 F (36.3 C)-97.9 F (36.6 C)] 97.3 F (36.3 C) (07/02 0738) Pulse Rate:  [38-153] 64 (07/02 0900) Resp:  [0-35] 20 (07/02 0900) BP: (73-209)/(39-170) 105/81 (07/02 0900) SpO2:  [86 %-100 %] 96 % (07/02 0900) Weight:  [105.1 kg (231 lb 11.3 oz)] 105.1 kg (231 lb 11.3 oz) (07/02 0100)  Intake/Output from previous day: 07/01 0701 - 07/02 0700 In: 2369.3 [I.V.:2168; IV Piggyback:201.2] Out: 1250 [Urine:1250] Intake/Output from this shift: No intake/output data recorded.  Physical Exam: Neck: no adenopathy, no carotid bruit, no JVD and supple, symmetrical, trachea midline Lungs: Bibasilar rales noted  Heart: regular rate and rhythm, S1, S2 normal and Soft systolic murmur noted Abdomen: soft, non-tender; bowel sounds normal; no masses,  no organomegaly Extremities: No clubbing cyanosis 1+ edema noted  Lab Results: Recent Labs    09/17/17 0042 09/17/17 0407  WBC 13.1* 12.2*  HGB 9.9* 9.0*  PLT 398 393   Recent Labs    09/17/17 0042 09/17/17 0407  NA 138 138  K 4.0 4.1  CL 107 107  CO2 15* 21*  GLUCOSE 182* 218*  BUN 51* 55*  CREATININE 4.87* 5.22*   Recent Labs    09/17/17 0153 09/17/17 0834  TROPONINI 1.97* 2.53*   Hepatic Function Panel Recent Labs    09/17/17 0042  PROT 5.5*  ALBUMIN 1.7*  AST 31  ALT 6  ALKPHOS 195*  BILITOT 0.7   No results for input(s): CHOL in the last 72 hours. No results for input(s): PROTIME in the last 72 hours.  Imaging: Imaging results have been reviewed and Dg Chest Port 1  View  Result Date: 09/17/2017 CLINICAL DATA:  Post CPR EXAM: PORTABLE CHEST 1 VIEW COMPARISON:  09/11/2017 FINDINGS: Cardiomegaly with vascular congestion and moderate pulmonary edema. Atelectasis or pneumonia at the left base. Small pleural effusions. No pneumothorax. IMPRESSION: Cardiomegaly with vascular congestion and moderate pulmonary edema and small pleural effusion. Atelectasis or pneumonia at the left lung base. Electronically Signed   By: Donavan Foil M.D.   On: 09/17/2017 02:10    Cardiac Studies:  Assessment/Plan:  Status post sustained V. Tach cardiac arrest Status post inferolateral wall myocardial infarction status post PCI to OM 2 Multivessel CAD with critical LAD stenosis Ischemic cardiomyopathy Metabolic acidosis Hypertensive heart disease with systolic dysfunction Decompensated systolic congestive heart failure Insulin requiring diabetes mellitus History of right CVA with left paresis History of tobacco abuse Acute on chronic kidney injury stage IV secondary to contrast improving Anemia of chronic disease Peripheral vascular disease Positive family history of coronary artery disease Plan Discussed with patient at length and his sister over the phone regarding emergency left cardiac catheterization and insertion of the intra-aortic balloon pump relook angiogram and PTCA stenting to LAD and its risk and benefits I.e. Death MI stroke need for emergency CABG worsening renal function requiring hemodialysis local vascular complications etc. and consents for PCI.   LOS: 6 days    Charolette Forward 09/17/2017, 10:12 AM

## 2017-09-17 NOTE — Progress Notes (Signed)
On arrival patient was unresponsive and not following commands. Pt was manually ventilated intermittently throughout the CODE, and blow by O2 was given when spontaneous respirations was noted. Once patient was alert/oriented and following commands of the MD. NRB placed on patient. No complications noted.

## 2017-09-17 NOTE — Progress Notes (Signed)
eLink Physician-Brief Progress Note Patient Name: Tommy Mclaughlin DOB: [birth year] MRN: [id number]   Date of Service  09/17/2017  HPI/Events of Note  Pain - d/t CPR.  eICU Interventions  Will order: 1. Fentanyl 25-50 mcg IV PRN pain.      Intervention Category Intermediate Interventions: Pain - evaluation and management  Sommer,Steven Eugene 09/17/2017, 6:01 AM

## 2017-09-17 NOTE — Code Documentation (Signed)
Witnessed Code Blue called at [facility]. Patient in sustained pulseless V-Tach at rate of 240. Code called and compressions started at [place]. 1 shock at 150 joules administered at 0028, compressions continued. 1 amp of Epi given, patient on IV Amiodarone bolus at time of code, bolus continued. ROSC achieved within 2 minutes of initiating code.   Dr. Carson Myrtle and Dr. Philipp Ovens at bedside giving additional orders. Dr. Terrence Dupont called, spoke with both present physicians over the phone and will be in to see patient shortly.   Patient drowsy but is alert and following commands, able to answer questions regarding care appropriately. Requiring low dose oxygen on non-rebreather.   Patient started on Bicarb drip as well as Amiodarone and Heparin drips.   Patient/Vitals stable, will continue to monitor for remainder of shift.

## 2017-09-17 NOTE — Progress Notes (Addendum)
Initial Pulmonary/Critical Care Consultation  Patient Name: Tommy Mclaughlin MRN: [id number] DOB: June 06, 1965    ADMISSION DATE:  09/11/2017 CONSULTATION DATE:  09/17/2017  REFERRING MD:  Dr. Charolette Forward  REASON FOR CONSULTATION:  Post-CPR resuscitation   HISTORY OF PRESENT ILLNESS  [name] reformed smoker is seen in consultation at the request of Dr. Charolette Forward for recommendations on further evaluation and management of post-CPR resuscitation.  The patient has been successfully resuscitated after pulseless ventricular tachycardia.  Case was discussed with the code team (Dr. Velna Ochs) with ROSC in under 2 minutes..  Post-CPR, the patient is arousable.  He is answering questions appropriately.  In fact, the patient is able to follow commands and provide his medical history.  Consultation request was placed by phone from Dr. Terrence Dupont to this service for intubation.  Patient was admitted on 09/11/2017 with complaints of lower extremity pain, lower extremity edema and increasing dyspnea.  This was associated with retrosternal chest pain.  EKG in the emergency department revealed septal Q waves and ST elevation in the inferolateral distribution, for which she was emergently taken to the cardiac catheterization lab.  Based on conversation with Dr. Terrence Dupont, stent was deployed to the [place].  He had additional lesions identified in the left anterior descending artery, which will require subsequent intervention.  The patient did present with acute kidney injury.  Case was discussed with Dr. Terrence Dupont.  They were awaiting improvement in renal function prior to returning to the Cath Lab.  Nursing staff reports that the patient had bigeminy and runs of nonsustained ventricular tachycardia during day shift.  The patient apparently had an amiodarone bolus ordered this evening, after his rhythm failed to improve with p.o. amiodarone.  Just as the amiodarone bolus was being  administered, the patient experienced pulseless ventricular tachycardia.  SUBJECTIVE/INTERVAL: having some muscular chest pain this morning exacerbated by cough/movement r/t CPR. Depressed this morning.   VITAL SIGNS: BP 103/64   Pulse 65   Temp (!) 97.3 F (36.3 C) (Oral)   Resp 20   Ht 6\' 2"  (1.88 m)   Wt 105.1 kg (231 lb 11.3 oz)   SpO2 94%   BMI 29.75 kg/m    INTAKE / OUTPUT: I/O last 3 completed shifts: In: 2890.2 [P.O.:240; I.V.:2446.3; IV Piggyback:204] Out: 3000 [Urine:3000]  PHYSICAL EXAM: General:  Middle aged black male, overweight, in NAD Neuro:  Alert, oriented, non-focal.  HEENT:  [place]/AT, PERRL Cardiovascular:  RRR, no MRG Lungs:  Clear, bilateral breath sounds Abdomen:  Soft, non-distended, non-tender.  Musculoskeletal:  No acute deformity or ROM limitation.  Skin:  Intact, MMM   LABS:  ABG Recent Labs  Lab 09/17/17 0054  PHART 7.357  PCO2ART 27.6*  PO2ART 257.0*    Cardiac Enzymes Recent Labs  Lab 09/14/17 0243 09/17/17 0042 09/17/17 0153  TROPONINI 5.28* 2.36* 1.97*    BASIC METABOLIC PROFILE Recent Labs  Lab 09/15/17 0236 09/16/17 0239 09/16/17 1550 09/17/17 0042 09/17/17 0153 09/17/17 0407  NA 134* 137 139 138  --  138  K 3.4* 3.8 4.2 4.0  --  4.1  CL 101 103 104 107  --  107  CO2 20* 22 19* 15*  --  21*  BUN 55* 55* 56* 51*  --  55*  CREATININE 6.15* 5.75* 5.48* 4.87*  --  5.22*  GLUCOSE 140* 181* 179* 182*  --  218*  CALCIUM 7.4* 7.6* 7.8* 7.0*  --  7.1*  MG  --  2.2 2.1  --  2.0  --   PHOS 4.5 4.9*  --   --   --   --     Glucose Recent Labs  Lab 09/16/17 0626 09/16/17 1204 09/16/17 1636 09/16/17 2128 09/17/17 0629 09/17/17 0741  GLUCAP 159* 164* 159* 210* 214* 204*    Liver Enzymes Recent Labs  Lab 09/15/17 0236 09/16/17 0239 09/17/17 0042  AST  --   --  31  ALT  --   --  6  ALKPHOS  --   --  195*  BILITOT  --   --  0.7  ALBUMIN 1.6* 1.5* 1.7*    CBC Recent Labs  Lab 09/14/17 0243 09/17/17 0042  09/17/17 0407  WBC 11.2* 13.1* 12.2*  HGB 9.2* 9.9* 9.0*  HCT 29.3* 31.7* 28.3*  PLT 369 398 393    COAGULATION STUDIES Recent Labs  Lab 09/11/17 1318  INR 1.12    SEPSIS MARKERS Recent Labs  Lab 09/17/17 0042 09/17/17 0153 09/17/17 0407  LATICACIDVEN 2.9* 2.1* 1.2    CULTURES: Results for orders placed or performed during the hospital encounter of 09/11/17  MRSA PCR Screening     Status: None   Collection Time: 09/11/17  8:47 PM  Result Value Ref Range Status   MRSA by PCR NEGATIVE NEGATIVE Final    Comment:        The GeneXpert MRSA Assay (FDA approved for NASAL specimens only), is one component of a comprehensive MRSA colonization surveillance program. It is not intended to diagnose MRSA infection nor to guide or monitor treatment for MRSA infections. Performed at [facility], [place] [address], [place], [place] [address]     OTHER STUDIES:  EKG (09/17/2017) demonstrates interval T wave flattening and inversion in all the anterior leads.  Persistent (albeit essentially unchanged) ST elevation in limb leads II, III and aVF.  SIGNIFICANT EVENTS: 6/26: Admitted with acute ST elevation myocardial infarction.  Emergent cardiac catheterization resulting in stent deployment to OM1.  Identification of additional critical stenosis in the LAD. 7/1: CODE BLUE for pulseless ventricular tachycardia.  ROSC in less than 2 minutes.   ASSESSMENT / PLAN: Active Problems:   Acute MI, inferolateral wall (HCC)   Acute hypoxemic respiratory failure (HCC)   Pulseless ventricular tachycardia (HCC)   Acute on chronic systolic and diastolic heart failure, NYHA class 3 (HCC)   Acute renal failure superimposed on stage 4 chronic kidney disease (HCC)   High anion gap metabolic acidosis   CARDIOVASCULAR: Pulseless ventricular tachycardia, successfully resuscitated (ROSC in < 2 minutes) Acute ST elevation myocardial infarction Chronic systolic and diastolic heart failure, LVEF  30-35% on 2D echo (07/23/2017) -Amiodarone infusion -Heparin infusion. -Echo -Cardiology primary  RENAL Acute on chronic kidney disease, (stage IV at baseline) Anion gap metabolic acidosis, likely secondary to uremia /- lactic -Bicarbonate infusion -Follow BMP  PULMONARY Acute hypoxemic respiratory failure History of tobacco abuse -Titrate supplemental oxygen to maintain SpO2 90+%. -This patient should be advised to undergo diagnostic polysomnography as an outpatient.  ENDOCRINE Type 2 diabetes mellitus with renal complication -Sliding scale insulin.  GASTROINTESTINAL GI prophylaxis: Protonix  HEMATOLOGIC DVT prophylaxis: On Heparin infusion   FAMILY  - Updates: patient updated bedside. Expressing concern with his overall level of health and desire to not go through all this again. I stressed that he is a young guy and has treatable conditions and with proper treatment he could have a relatively good quality of life. He is still feeling like he may not want to go  through with aggressive measures. Will consult palliative medicine team.   - Inter-disciplinary family meet or Palliative Care meeting due by: 09/23/2017  Georgann Housekeeper, AGACNP-BC [facility]/Critical Care Pager [phone] or [phone]  09/17/2017 8:32 AM

## 2017-09-17 NOTE — Progress Notes (Signed)
  Echocardiogram 2D Echocardiogram has been performed.  Madelaine Etienne 09/17/2017, 10:12 AM

## 2017-09-17 NOTE — Progress Notes (Signed)
Subjective:  Patient went into sustained VT requiring CPR and defibrillation subsequently converted to sinus rhythm patient is awake alert denies any anginal chest pain. Patient received IV amiodarone bolus and being started on drip. Blood pressure is soft pale hold blood pressure medications. Start him on heparin as per pharmacy.appreciate CCM consult and help.  Objective:  Vital Signs in the last 24 hours: Temp:  [97.4 F (36.3 C)-98 F (36.7 C)] 97.5 F (36.4 C) (07/01 2345) Pulse Rate:  [38-97] 38 (07/02 0000) Resp:  [8-26] 23 (07/02 0000) BP: (104-172)/(55-107) 105/55 (07/02 0000) SpO2:  [87 %-100 %] 100 % (07/02 0000) Weight:  [105.1 kg (231 lb 11.3 oz)] 105.1 kg (231 lb 11.3 oz) (07/01 0422)  Intake/Output from previous day: 07/01 0701 - 07/02 0700 In: 722.5 [I.V.:571.2; IV Piggyback:151.3] Out: 935 [Urine:935] Intake/Output from this shift: Total I/O In: 142.9 [I.V.:142.9] Out: 750 [Urine:750]  Physical Exam: Neck: no adenopathy, no carotid bruit, no JVD and supple, symmetrical, trachea midline Lungs: Clear to auscultation anterolaterally Heart: regular rate and rhythm, S1, S2 normal and Soft systolic murmur noted Abdomen: soft, non-tender; bowel sounds normal; no masses,  no organomegaly Extremities: extremities normal, atraumatic, no cyanosis or edema  Lab Results: Recent Labs    09/14/17 0243 09/17/17 0042  WBC 11.2* 13.1*  HGB 9.2* 9.9*  PLT 369 398   Recent Labs    09/16/17 0239 09/16/17 1550  NA 137 139  K 3.8 4.2  CL 103 104  CO2 22 19*  GLUCOSE 181* 179*  BUN 55* 56*  CREATININE 5.75* 5.48*   Recent Labs    09/14/17 0243  TROPONINI 5.28*   Hepatic Function Panel Recent Labs    09/16/17 0239  ALBUMIN 1.5*   No results for input(s): CHOL in the last 72 hours. No results for input(s): PROTIME in the last 72 hours.  Imaging: Imaging results have been reviewed and No results found.  Cardiac Studies:  Assessment/Plan:  Status post  sustained V. Tach cardiac arrest Status post inferolateral wall myocardial infarction status post PCI to OM 2 Multivessel CAD with critical LAD stenosis Ischemic cardiomyopathy Metabolic acidosis Hypertensive heart disease with systolic dysfunction Compensated systolic congestive heart failure Insulin requiring diabetes mellitus History of right CVA with left paresis History of tobacco abuse Acute on chronic kidney injury stage IV secondary to contrast improving Anemia of chronic disease Peripheral vascular disease Positive family history of coronary artery disease Plan Check serial enzymes and EKG Start IV heparin per pharmacy, restart IV nitroglycerin as blood pressure tolerates Start IV amiodarone drip 60 mg/h for 6 hours and then 30 mg/h. Agree with sodium bicarbonate drip Check 2-D echo in a.m. Check labs in am   LOS: 6 days    Charolette Forward 09/17/2017, 1:23 AM

## 2017-09-17 NOTE — Progress Notes (Signed)
ACT 213. Will recheck in 30 mins.

## 2017-09-17 NOTE — Progress Notes (Signed)
ANTICOAGULATION CONSULT NOTE - Initial Consult  Pharmacy Consult for heparin Indication: MI/CAD  Allergies  Allergen Reactions  . Tomato Rash    Patient Measurements: Height: 6\' 2"  (188 cm) Weight: 231 lb 11.3 oz (105.1 kg) IBW/kg (Calculated) : 82.2 Heparin Dosing Weight: 100kg  Vital Signs: Temp: 97.5 F (36.4 C) (07/01 2345) Temp Source: Oral (07/01 2345) BP: 105/55 (07/02 0000) Pulse Rate: 38 (07/02 0000)  Labs: Recent Labs    09/14/17 0243 09/15/17 0236 09/16/17 0239 09/16/17 1550 09/17/17 0042  HGB 9.2*  --   --   --  9.9*  HCT 29.3*  --   --   --  31.7*  PLT 369  --   --   --  398  CREATININE 5.32* 6.15* 5.75* 5.48*  --   TROPONINI 5.28*  --   --   --   --     Estimated Creatinine Clearance: 20.4 mL/min (A) (by C-G formula based on SCr of 5.48 mg/dL (H)).   Medical History: Past Medical History:  Diagnosis Date  . CHF (congestive heart failure) ([place])   . CVA (cerebral vascular accident) ([place])   . Heart failure ([place])   . Hypertension   . Status post CVA   . T2DM (type 2 diabetes mellitus) ([place])     Medications:  Medications Prior to Admission  Medication Sig Dispense Refill Last Dose  . atorvastatin (LIPITOR) 40 MG tablet Take 1 tablet (40 mg total) by mouth daily at 6 PM. 30 tablet 2 09/10/2017 at Unknown time  . bacitracin ointment Apply topically as needed for wound care. 120 g 0 09/10/2017 at prn  . carvedilol (COREG) 25 MG tablet Take 1 tablet (25 mg total) by mouth 2 (two) times daily with a meal. 180 tablet 2 09/11/2017 at 0630  . Dulaglutide (TRULICITY) 0.16 WF/0.9NA SOPN Inject 0.75 mg into the skin once a week. 4 pen 3 09/10/2017  . famotidine (PEPCID) 10 MG tablet Take 1 tablet (10 mg total) by mouth daily. 30 tablet 0 09/11/2017 at Unknown time  . furosemide (LASIX) 40 MG tablet Take 1 tablet (40 mg total) by mouth daily. 60 tablet 0 09/11/2017 at Unknown time  . losartan (COZAAR) 50 MG tablet Take 1 tablet (50 mg total) by mouth daily. 90  tablet 0 09/11/2017 at Unknown time  . nicotine (NICODERM CQ - DOSED IN MG/24 HOURS) 14 mg/24hr patch Place 1 patch (14 mg total) onto the skin daily. 28 patch 0 09/11/2017 at Unknown time  . traMADol (ULTRAM) 50 MG tablet Take 1 tablet (50 mg total) by mouth every 12 (twelve) hours as needed for moderate pain or severe pain. 20 tablet 0 09/08/2017 at prn   Scheduled:  . amLODipine  5 mg Oral Daily  . aspirin EC  81 mg Oral Daily  . atorvastatin  80 mg Oral q1800  . carvedilol  6.25 mg Oral BID WC  . hydrALAZINE  25 mg Oral Q8H   And  . isosorbide dinitrate  20 mg Oral TID  . insulin aspart  0-15 Units Subcutaneous Q4H  . pantoprazole  40 mg Oral Q0600  . sodium chloride flush  3 mL Intravenous Q12H  . ticagrelor  90 mg Oral BID   Infusions:  . sodium chloride 10 mL/hr (09/16/17 0617)  . sodium chloride 50 mL/hr at 09/17/17 0000  . amiodarone 60 mg/hr (09/17/17 0126)  . amiodarone    .  ceFAZolin (ANCEF) IV Stopped (09/16/17 1731)  . nitroGLYCERIN 5 mcg/min (09/17/17  0000)  .  sodium bicarbonate  infusion 1000 mL 60 mL/hr at 09/17/17 0120    Assessment: 52yo male admitted w/ acute anferolateral wall MI w/ late presentation, went to cath lab > stent to 100% occluded OM2 but requires planned PCI which has been on hold for AKI, overnight went into Vtach arrest > ROSC after epi x1, now to begin heparin.  Goal of Therapy:  Heparin level 0.3-0.7 units/ml Monitor platelets by anticoagulation protocol: Yes   Plan:  Will begin heparin gtt at 1400 units/hr and monitor heparin levels and CBC.  Wynona Neat, PharmD, BCPS  09/17/2017,1:27 AM
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
## 2017-09-17 NOTE — Progress Notes (Addendum)
ACT 202. Dr Terrence Dupont at bedside, MD wants to D/C right femoral arterial IABP and left femoral arterial sheath when ACT <150. RN will notify cath lab personnel of plans. RN will continue to monitor.

## 2017-09-17 NOTE — Progress Notes (Signed)
24f IABP removed from rt femoral artery at 2000.   Manual pressure applied to site for 30 min.  Rt groin level 0 and rt DP and PT doppled pre and post sheath removal.  Tegaderm dressing applied to site.  VSS.  ACT was 142 sec prior to IABP removal.  Post sheath removal instruction given. Pt acknowledges and understands instructions.

## 2017-09-17 NOTE — Progress Notes (Signed)
ACT 169. Left femoral arterial sheath may be removed at this time. ACT needs to be <150 per MD to remove IABP. RN will continue to monitor.
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
## 2017-09-17 NOTE — Progress Notes (Signed)
S: Events of last 12 hours noted, s/p VT arrest at 12:28 am s/p CPR and shock at 150 joules.  Pt started on bicarb and amiodarone and planning for urgent cardiac cath due to life-threatening arythmia and ongoing angina.  Pt is aware of risks of worsening renal failure and the possible need for hemodialysis.   O:BP 105/81   Pulse 64   Temp (!) 97.3 F (36.3 C) (Oral)   Resp 20   Ht 6\' 2"  (1.88 m)   Wt 105.1 kg (231 lb 11.3 oz)   SpO2 96%   BMI 29.75 kg/m   Intake/Output Summary (Last 24 hours) at 09/17/2017 1100 Last data filed at 09/17/2017 1034 Gross per 24 hour  Intake 2369.26 ml  Output 1165 ml  Net 1204.26 ml   Intake/Output: I/O last 3 completed shifts: In: 2890.2 [P.O.:240; I.V.:2446.3; IV Piggyback:204] Out: 3000 [Urine:3000]  Intake/Output this shift:  Total I/O In: -  Out: 100 [Urine:100] Weight change: -1.8 kg (-3 lb 15.5 oz) Gen: NAD CVS: no rub Resp: cta Abd: +BS, soft, mildly tender to deep palpation Ext: 1+ edema  Recent Labs  Lab 09/13/17 0302 09/14/17 0243 09/15/17 0236 09/16/17 0239 09/16/17 1550 09/17/17 0042 09/17/17 0407  NA 137 135 134* 137 139 138 138  K 3.9 3.6 3.4* 3.8 4.2 4.0 4.1  CL 104 101 101 103 104 107 107  CO2 21* 20* 20* 22 19* 15* 21*  GLUCOSE 158* 131* 140* 181* 179* 182* 218*  BUN 40* 46* 55* 55* 56* 51* 55*  CREATININE 4.11* 5.32* 6.15* 5.75* 5.48* 4.87* 5.22*  ALBUMIN  --   --  1.6* 1.5*  --  1.7*  --   CALCIUM 8.0* 7.7* 7.4* 7.6* 7.8* 7.0* 7.1*  PHOS  --   --  4.5 4.9*  --   --   --   AST  --   --   --   --   --  31  --   ALT  --   --   --   --   --  6  --    Liver Function Tests: Recent Labs  Lab 09/15/17 0236 09/16/17 0239 09/17/17 0042  AST  --   --  31  ALT  --   --  6  ALKPHOS  --   --  195*  BILITOT  --   --  0.7  PROT  --   --  5.5*  ALBUMIN 1.6* 1.5* 1.7*   No results for input(s): LIPASE, AMYLASE in the last 168 hours. No results for input(s): AMMONIA in the last 168 hours. CBC: Recent Labs  Lab  09/11/17 1318 09/12/17 0743 09/13/17 0302 09/14/17 0243 09/17/17 0042 09/17/17 0407  WBC 10.5 11.3* 12.9* 11.2* 13.1* 12.2*  NEUTROABS 6.7  --   --   --   --   --   HGB 8.4* 7.7* 9.7* 9.2* 9.9* 9.0*  HCT 27.5* 24.9* 31.0* 29.3* 31.7* 28.3*  MCV 83.6 82.2 84.0 82.8 84.8 83.7  PLT 331 341 334 369 398 393   Cardiac Enzymes: Recent Labs  Lab 09/13/17 0844 09/14/17 0243 09/17/17 0042 09/17/17 0153 09/17/17 0834  TROPONINI 4.56* 5.28* 2.36* 1.97* 2.53*   CBG: Recent Labs  Lab 09/16/17 1204 09/16/17 1636 09/16/17 2128 09/17/17 0629 09/17/17 0741  GLUCAP 164* 159* 210* 214* 204*    Iron Studies:  Recent Labs    09/17/17 0407  IRON 19*  TIBC 169*  FERRITIN 235   Studies/Results: Dg Chest [facility]  1 View  Result Date: 09/17/2017 CLINICAL DATA:  Post CPR EXAM: PORTABLE CHEST 1 VIEW COMPARISON:  09/11/2017 FINDINGS: Cardiomegaly with vascular congestion and moderate pulmonary edema. Atelectasis or pneumonia at the left base. Small pleural effusions. No pneumothorax. IMPRESSION: Cardiomegaly with vascular congestion and moderate pulmonary edema and small pleural effusion. Atelectasis or pneumonia at the left lung base. Electronically Signed   By: Donavan Foil M.D.   On: 09/17/2017 02:10   . [MAR Hold] aspirin EC  81 mg Oral Daily  . [MAR Hold] atorvastatin  80 mg Oral q1800  . [MAR Hold] carvedilol  6.25 mg Oral BID WC  . [MAR Hold] insulin aspart  0-15 Units Subcutaneous Q4H  . [MAR Hold] pantoprazole  40 mg Oral Q0600  . [MAR Hold] sodium chloride flush  3 mL Intravenous Q12H  . [MAR Hold] ticagrelor  90 mg Oral BID    BMET    Component Value Date/Time   NA 138 09/17/2017 0407   NA 141 08/27/2017 1134   K 4.1 09/17/2017 0407   CL 107 09/17/2017 0407   CO2 21 (L) 09/17/2017 0407   GLUCOSE 218 (H) 09/17/2017 0407   BUN 55 (H) 09/17/2017 0407   BUN 32 (H) 08/27/2017 1134   CREATININE 5.22 (H) 09/17/2017 0407   CALCIUM 7.1 (L) 09/17/2017 0407   GFRNONAA 11 (L)  09/17/2017 0407   GFRAA 13 (L) 09/17/2017 0407   CBC    Component Value Date/Time   WBC 12.2 (H) 09/17/2017 0407   RBC 3.38 (L) 09/17/2017 0407   HGB 9.0 (L) 09/17/2017 0407   HCT 28.3 (L) 09/17/2017 0407   HCT 29.2 (L) 07/25/2017 0346   PLT 393 09/17/2017 0407   MCV 83.7 09/17/2017 0407   MCH 26.6 09/17/2017 0407   MCHC 31.8 09/17/2017 0407   RDW 16.2 (H) 09/17/2017 0407   LYMPHSABS 2.7 09/11/2017 1318   MONOABS 1.0 09/11/2017 1318   EOSABS 0.1 09/11/2017 1318   BASOSABS 0.0 09/11/2017 1318     Assessment/Plan:  1. VT arrest with refractory angina- plan for urgent cardiac cath per Dr. Terrence Dupont.  I discussed the possible need for HD if his renal function continues to worsen and Mr. Grigg understands. 2. AKI/CKD stage 3 due to IV contrast with underlying diabetic nephropathy.  Cr peaked at 6.15 but improving to 4.85 but then increased again following code.  Would prefer to hold off on cardiac cath until his creatinine returns closer to baseline, however he developed VT arrest and CP s/p cpr and shocked.  Will follow closely after cath. 3. Multi-vessel CAD s/p acute Inferolateral MI s/p DES to OM2.  Also with LAD lesions 80 and 85%, prox and mid respectively.  as above 4. Anemia of CKD stage 3- check iron stores and consider ESA 5. DM- per primary 6. HTN- stable 7. CHF- diuresed well, follow I's/O's and daily creatinine.   Donetta Potts, MD [employer] [phone]

## 2017-09-17 NOTE — Progress Notes (Signed)
Subjective:  Patient complains of vague left-sided chest pain without associated symptoms had sustained V. Tach arrest last night requiring CPR and defibrillation cardiac enzymes were trending down but again are elevated this morning. Renal function also trending up patient having frequent bigeminy and couplets on the monitor despite being on IV amiodarone and IV lidocaine was started now.    Objective:  Vital Signs in the last 24 hours: Temp:  [97.3 F (36.3 C)-97.9 F (36.6 C)] 97.3 F (36.3 C) (07/02 0738) Pulse Rate:  [38-153] 64 (07/02 0900) Resp:  [0-35] 20 (07/02 0900) BP: (73-209)/(39-170) 105/81 (07/02 0900) SpO2:  [86 %-100 %] 96 % (07/02 0900) Weight:  [105.1 kg (231 lb 11.3 oz)] 105.1 kg (231 lb 11.3 oz) (07/02 0100)  Intake/Output from previous day: 07/01 0701 - 07/02 0700 In: 2369.3 [I.V.:2168; IV Piggyback:201.2] Out: 1250 [Urine:1250] Intake/Output from this shift: No intake/output data recorded.  Physical Exam: Neck: no adenopathy, no carotid bruit, no JVD and supple, symmetrical, trachea midline Lungs: Bibasilar rales noted  Heart: regular rate and rhythm, S1, S2 normal and Soft systolic murmur noted Abdomen: soft, non-tender; bowel sounds normal; no masses,  no organomegaly Extremities: No clubbing cyanosis 1+ edema noted  Lab Results: Recent Labs    09/17/17 0042 09/17/17 0407  WBC 13.1* 12.2*  HGB 9.9* 9.0*  PLT 398 393   Recent Labs    09/17/17 0042 09/17/17 0407  NA 138 138  K 4.0 4.1  CL 107 107  CO2 15* 21*  GLUCOSE 182* 218*  BUN 51* 55*  CREATININE 4.87* 5.22*   Recent Labs    09/17/17 0153 09/17/17 0834  TROPONINI 1.97* 2.53*   Hepatic Function Panel Recent Labs    09/17/17 0042  PROT 5.5*  ALBUMIN 1.7*  AST 31  ALT 6  ALKPHOS 195*  BILITOT 0.7   No results for input(s): CHOL in the last 72 hours. No results for input(s): PROTIME in the last 72 hours.  Imaging: Imaging results have been reviewed and Dg Chest Port 1  View  Result Date: 09/17/2017 CLINICAL DATA:  Post CPR EXAM: PORTABLE CHEST 1 VIEW COMPARISON:  09/11/2017 FINDINGS: Cardiomegaly with vascular congestion and moderate pulmonary edema. Atelectasis or pneumonia at the left base. Small pleural effusions. No pneumothorax. IMPRESSION: Cardiomegaly with vascular congestion and moderate pulmonary edema and small pleural effusion. Atelectasis or pneumonia at the left lung base. Electronically Signed   By: Donavan Foil M.D.   On: 09/17/2017 02:10    Cardiac Studies:  Assessment/Plan:  Status post sustained V. Tach cardiac arrest Status post inferolateral wall myocardial infarction status post PCI to OM 2 Multivessel CAD with critical LAD stenosis Ischemic cardiomyopathy Metabolic acidosis Hypertensive heart disease with systolic dysfunction Decompensated systolic congestive heart failure Insulin requiring diabetes mellitus History of right CVA with left paresis History of tobacco abuse Acute on chronic kidney injury stage IV secondary to contrast improving Anemia of chronic disease Peripheral vascular disease Positive family history of coronary artery disease Plan Discussed with patient at length and his sister over the phone regarding emergency left cardiac catheterization and insertion of the intra-aortic balloon pump relook angiogram and PTCA stenting to LAD and its risk and benefits I.e. Death MI stroke need for emergency CABG worsening renal function requiring hemodialysis local vascular complications etc. and consents for PCI.   LOS: 6 days    Charolette Forward 09/17/2017, 10:12 AM

## 2017-09-17 NOTE — Code Documentation (Addendum)
  Patient Name: Tommy Mclaughlin   MRN: [id number]   Date of Birth/ Sex: [birth year] , male      Admission Date: 09/11/2017  Attending Provider: Charolette Forward, MD  Primary Diagnosis: <principal problem not specified>   Indication: Pt was in his usual state of health until this AM, when he was noted to be ventricular tachycardia. Code blue was subsequently called. At the time of arrival on scene, ACLS protocol was underway.   Technical Description:  - CPR performance duration:  <2 minutes  - Was defibrillation or cardioversion used? Yes   - Was external pacer placed? No  - Was patient intubated pre/post CPR? No   Medications Administered: Y = Yes; Blank = No Amiodarone  Y  Atropine    Calcium    Epinephrine  Y  Lidocaine    Magnesium    Norepinephrine    Phenylephrine    Sodium bicarbonate    Vasopressin     Post CPR evaluation:  - Final Status - Was patient successfully resuscitated ? Yes - What is current rhythm? Sinus - What is current hemodynamic status? Stable   Miscellaneous Information:  - Labs sent, including: CMP, CBC, Troponin, Lactic acid, ABG3  - Primary team notified?  Yes  - Family Notified?  No  - Additional notes/ transfer status:  None, PCCM at bedside evaluating patient      Velna Ochs, MD  09/17/2017, 1:04 AM

## 2017-09-17 NOTE — Interval H&P Note (Signed)
Cath Lab Visit (complete for each Cath Lab visit)  Clinical Evaluation Leading to the Procedure:   ACS: Yes.    Non-ACS:    Anginal Classification: CCS IV  Anti-ischemic medical therapy: Maximal Therapy (2 or more classes of medications)  Non-Invasive Test Results: No non-invasive testing performed  Prior CABG: No previous CABG      History and Physical Interval Note:  09/17/2017 10:36 AM  Zenovia Jarred  has presented today for surgery, with the diagnosis of cad  The various methods of treatment have been discussed with the patient and family. After consideration of risks, benefits and other options for treatment, the patient has consented to  Procedure(s): CORONARY STENT INTERVENTION (N/A) as a surgical intervention .  The patient's history has been reviewed, patient examined, no change in status, stable for surgery.  I have reviewed the patient's chart and labs.  Questions were answered to the patient's satisfaction.     Charolette Forward

## 2017-09-17 NOTE — Progress Notes (Signed)
Subjective:   Appreciate EP consult and help. Doing well denies any anginal chest pain or shortness of breath. No further V. Tach or bigeminy on the monitor. Tolerated left cardiac catheterization noted to have patent obtuse marginal stents and then . PTCA stenting to mid and distal LAD with intra-aortic balloon pump support with excellent angiographic results.   Objective:  Vital Signs in the last 24 hours: Temp:  [97.3 F (36.3 C)-97.5 F (36.4 C)] 97.3 F (36.3 C) (07/02 0738) Pulse Rate:  [0-153] 62 (07/02 1728) Resp:  [0-53] 18 (07/02 1700) BP: (73-209)/(39-170) 152/94 (07/02 1728) SpO2:  [0 %-100 %] 100 % (07/02 1700) Arterial Line BP: (117-143)/(53-67) 143/60 (07/02 1700) Weight:  [105.1 kg (231 lb 11.3 oz)] 105.1 kg (231 lb 11.3 oz) (07/02 0100)  Intake/Output from previous day: 07/01 0701 - 07/02 0700 In: 2369.3 [I.V.:2168; IV Piggyback:201.2] Out: 1250 [Urine:1250] Intake/Output from this shift: Total I/O In: 922.8 [P.O.:120; I.V.:752.8; IV Piggyback:50] Out: 265 [Urine:265]  Physical Exam: Neck: no adenopathy, no carotid bruit, no JVD and supple, symmetrical, trachea midline Lungs: Decreased breath sound at bases Heart: regular rate and rhythm, S1, S2 normal and Soft systolic murmur noted Abdomen: soft, non-tender; bowel sounds normal; no masses,  no organomegaly Extremities: No clubbing cyanosis trace edema noted in both groin stable  Lab Results: Recent Labs    09/17/17 0042 09/17/17 0407  WBC 13.1* 12.2*  HGB 9.9* 9.0*  PLT 398 393   Recent Labs    09/17/17 0042 09/17/17 0407  NA 138 138  K 4.0 4.1  CL 107 107  CO2 15* 21*  GLUCOSE 182* 218*  BUN 51* 55*  CREATININE 4.87* 5.22*   Recent Labs    09/17/17 0834 09/17/17 1405  TROPONINI 2.53* 2.62*   Hepatic Function Panel Recent Labs    09/17/17 0042  PROT 5.5*  ALBUMIN 1.7*  AST 31  ALT 6  ALKPHOS 195*  BILITOT 0.7   No results for input(s): CHOL in the last 72 hours. No results for  input(s): PROTIME in the last 72 hours.  Imaging: Imaging results have been reviewed and Dg Chest Port 1 View  Result Date: 09/17/2017 CLINICAL DATA:  Post CPR EXAM: PORTABLE CHEST 1 VIEW COMPARISON:  09/11/2017 FINDINGS: Cardiomegaly with vascular congestion and moderate pulmonary edema. Atelectasis or pneumonia at the left base. Small pleural effusions. No pneumothorax. IMPRESSION: Cardiomegaly with vascular congestion and moderate pulmonary edema and small pleural effusion. Atelectasis or pneumonia at the left lung base. Electronically Signed   By: Donavan Foil M.D.   On: 09/17/2017 02:10    Cardiac Studies:  Assessment/Plan:  Status post sustained V. Tach cardiac arrest Status post inferolateral wall myocardial infarction status post PCI to OM 2 Multivessel CAD with critical LAD stenosis Kstatus post PTCA stenting to mid and distal LAD with excellent angiographic results and intra-aortic balloon pump insertion Ischemic cardiomyopathy Metabolic acidosis Hypertensive heart disease with systolic dysfunction Decompensated systolic congestive heart failure Insulin requiring diabetes mellitus History of right CVA with left paresis History of tobacco abuse Acute on chronic kidney injury stage IV secondary to contrast improving Anemia of chronic disease Peripheral vascular disease Positive family history of coronary artery disease Plan  continue present management .DC intra-aortic balloon pump and arterial sheaths once ACT below 150 Monitor closely urine output. Check labs  LOS: 6 days    Tommy Mclaughlin 09/17/2017, 6:03 PM

## 2017-09-18 LAB — RENAL FUNCTION PANEL
ALBUMIN: 1.7 g/dL — AB (ref 3.5–5.0)
Anion gap: 11 (ref 5–15)
BUN: 52 mg/dL — ABNORMAL HIGH (ref 6–20)
CALCIUM: 7.7 mg/dL — AB (ref 8.9–10.3)
CO2: 24 mmol/L (ref 22–32)
Chloride: 102 mmol/L (ref 98–111)
Creatinine, Ser: 4.6 mg/dL — ABNORMAL HIGH (ref 0.61–1.24)
GFR calc non Af Amer: 13 mL/min — ABNORMAL LOW (ref >60)
GFR, EST AFRICAN AMERICAN: 16 mL/min — AB (ref >60)
Glucose, Bld: 180 mg/dL — ABNORMAL HIGH (ref 70–99)
PHOSPHORUS: 4.1 mg/dL (ref 2.5–4.6)
POTASSIUM: 3.4 mmol/L — AB (ref 3.5–5.1)
SODIUM: 137 mmol/L (ref 135–145)

## 2017-09-18 LAB — GLUCOSE, CAPILLARY
GLUCOSE-CAPILLARY: 159 mg/dL — AB (ref 70–99)
GLUCOSE-CAPILLARY: 173 mg/dL — AB (ref 70–99)
Glucose-Capillary: 182 mg/dL — ABNORMAL HIGH (ref 70–99)
Glucose-Capillary: 198 mg/dL — ABNORMAL HIGH (ref 70–99)
Glucose-Capillary: 167 mg/dL — ABNORMAL HIGH (ref 70–99)
Glucose-Capillary: 226 mg/dL — ABNORMAL HIGH (ref 70–99)

## 2017-09-18 LAB — BASIC METABOLIC PANEL
Anion gap: 12 (ref 5–15)
BUN: 52 mg/dL — ABNORMAL HIGH (ref 6–20)
CALCIUM: 7.7 mg/dL — AB (ref 8.9–10.3)
CO2: 23 mmol/L (ref 22–32)
CREATININE: 4.61 mg/dL — AB (ref 0.61–1.24)
Chloride: 103 mmol/L (ref 98–111)
GFR calc Af Amer: 15 mL/min — ABNORMAL LOW (ref >60)
GFR, EST NON AFRICAN AMERICAN: 13 mL/min — AB (ref >60)
Glucose, Bld: 181 mg/dL — ABNORMAL HIGH (ref 70–99)
Potassium: 3.4 mmol/L — ABNORMAL LOW (ref 3.5–5.1)
SODIUM: 138 mmol/L (ref 135–145)

## 2017-09-18 LAB — CBC
HEMATOCRIT: 27.8 % — AB (ref 39.0–52.0)
HEMOGLOBIN: 9 g/dL — AB (ref 13.0–17.0)
MCH: 26.7 pg (ref 26.0–34.0)
MCHC: 32.4 g/dL (ref 30.0–36.0)
MCV: 82.5 fL (ref 78.0–100.0)
Platelets: 422 10*3/uL — ABNORMAL HIGH (ref 150–400)
RBC: 3.37 MIL/uL — AB (ref 4.22–5.81)
RDW: 16.5 % — ABNORMAL HIGH (ref 11.5–15.5)
WBC: 9.2 10*3/uL (ref 4.0–10.5)

## 2017-09-18 LAB — POCT ACTIVATED CLOTTING TIME: Activated Clotting Time: 158 seconds

## 2017-09-18 LAB — LIDOCAINE LEVEL: Lidocaine Lvl: 3.9 ug/mL (ref 1.5–5.0)

## 2017-09-18 MED ORDER — ORAL CARE MOUTH RINSE
15.0000 mL | Freq: Two times a day (BID) | OROMUCOSAL | Status: DC
  Administered 2017-09-18 – 2017-09-24 (×10): 15 mL via OROMUCOSAL

## 2017-09-18 MED ORDER — POTASSIUM CHLORIDE CRYS ER 10 MEQ PO TBCR
10.0000 meq | EXTENDED_RELEASE_TABLET | Freq: Once | ORAL | Status: AC
  Administered 2017-09-18: 10 meq via ORAL
  Filled 2017-09-18: qty 1

## 2017-09-18 MED ORDER — ISOSORB DINITRATE-HYDRALAZINE 20-37.5 MG PO TABS
0.5000 | ORAL_TABLET | Freq: Two times a day (BID) | ORAL | Status: DC
  Administered 2017-09-18 – 2017-09-19 (×3): 0.5 via ORAL
  Filled 2017-09-18 (×5): qty 0.5

## 2017-09-18 MED ORDER — FERROUS SULFATE 325 (65 FE) MG PO TABS
325.0000 mg | ORAL_TABLET | Freq: Three times a day (TID) | ORAL | Status: DC
  Administered 2017-09-18 – 2017-09-19 (×3): 325 mg via ORAL
  Filled 2017-09-18 (×3): qty 1

## 2017-09-18 MED ORDER — ATROPINE SULFATE 1 MG/10ML IJ SOSY
PREFILLED_SYRINGE | INTRAMUSCULAR | Status: AC
  Filled 2017-09-18: qty 10

## 2017-09-18 MED FILL — Lidocaine HCl Local Preservative Free (PF) Inj 1%: INTRAMUSCULAR | Qty: 30 | Status: AC

## 2017-09-18 MED FILL — Heparin Sod (Porcine)-NaCl IV Soln 1000 Unit/500ML-0.9%: INTRAVENOUS | Qty: 1000 | Status: AC

## 2017-09-18 NOTE — Progress Notes (Signed)
CARDIAC REHAB PHASE I   PRE:  Rate/Rhythm: 70 SR    BP: sitting 116/73    SaO2: 100 RA  MODE:  Ambulation: 190 ft   POST:  Rate/Rhythm: 71 SR    BP: sitting 98/58     SaO2: 93 RA  Pt in recliner. Able to stand second attempt. Used RW, gait belt assist x1 with another supervision assist from RN. Pt did surprisingly well. Sts he felt well. Slow, steady walking. Rest x2 to access how he felt. Only c/o was chest soreness from CPR. Return to recliner, VSS. Gave MI book, stent card. Discussed Brilinta, which he sts he takes meds x2 a day so won't be a problem. We briefly discussed diet although he needs a dietician c/s with renal issues now. He is eating Cheetos in room, 500 mg sodium a bag. Discussed this but he kept eating.  [place], ACSM 09/18/2017 3:08 PM

## 2017-09-18 NOTE — Progress Notes (Signed)
Left groin arterial sheath pulled at 0122 by Casilda Carls, RN witnessed by Myles Gip., RN. Manual pressure held for 25 min until 0147.  VSS throughout, pt drowsy but alert and oriented. No complaints of pain or nausea. Pressure dressing applied. Site is Lvl 0.

## 2017-09-18 NOTE — Progress Notes (Signed)
While in room patient and brother discussing dinner options. Education given to patient that he needs to follow a low sodium and carb modified diet. Patient states he knows, but he just doesn't like the taste of food without some salt. Further education given, patient and brother stated they understand the importance. Will continue to monitor.

## 2017-09-18 NOTE — Progress Notes (Signed)
Patient resting comfortably on nasal cannula with no distress noted.  Bipap currently not indicated.  Will continue to monitor and assess for bipap needs.

## 2017-09-18 NOTE — Progress Notes (Signed)
S: Feels better and is with only a little chest wall tenderness but not like his angina O:BP 109/71   Pulse 66   Temp (!) 96.7 F (35.9 C) (Oral)   Resp 16   Ht 6\' 2"  (1.88 m)   Wt 108.5 kg (239 lb 3.2 oz)   SpO2 100%   BMI 30.71 kg/m   Intake/Output Summary (Last 24 hours) at 09/18/2017 1203 Last data filed at 09/18/2017 1100 Gross per 24 hour  Intake 2505.03 ml  Output 1150 ml  Net 1355.03 ml   Intake/Output: I/O last 3 completed shifts: In: 4055.4 [P.O.:120; I.V.:3785.4; IV Piggyback:150] Out: 5400 [Urine:1845]  Intake/Output this shift:  Total I/O In: 239.3 [I.V.:239.3] Out: 470 [Urine:470] Weight change: 3.4 kg (7 lb 7.9 oz) Gen:NAD CVS: no rub Resp: cta Abd: benign Ext: trace edema  Recent Labs  Lab 09/15/17 0236 09/16/17 0239 09/16/17 1550 09/17/17 0042 09/17/17 0407 09/17/17 1804 09/18/17 0227  NA 134* 137 139 138 138 138 137  138  K 3.4* 3.8 4.2 4.0 4.1 3.7 3.4*  3.4*  CL 101 103 104 107 107 104 102  103  CO2 20* 22 19* 15* 21* 22 24  23   GLUCOSE 140* 181* 179* 182* 218* 169* 180*  181*  BUN 55* 55* 56* 51* 55* 53* 52*  52*  CREATININE 6.15* 5.75* 5.48* 4.87* 5.22* 4.86* 4.60*  4.61*  ALBUMIN 1.6* 1.5*  --  1.7*  --   --  1.7*  CALCIUM 7.4* 7.6* 7.8* 7.0* 7.1* 7.6* 7.7*  7.7*  PHOS 4.5 4.9*  --   --   --   --  4.1  AST  --   --   --  31  --   --   --   ALT  --   --   --  6  --   --   --    Liver Function Tests: Recent Labs  Lab 09/16/17 0239 09/17/17 0042 09/18/17 0227  AST  --  31  --   ALT  --  6  --   ALKPHOS  --  195*  --   BILITOT  --  0.7  --   PROT  --  5.5*  --   ALBUMIN 1.5* 1.7* 1.7*   No results for input(s): LIPASE, AMYLASE in the last 168 hours. No results for input(s): AMMONIA in the last 168 hours. CBC: Recent Labs  Lab 09/11/17 1318  09/14/17 0243 09/17/17 0042 09/17/17 0407 09/17/17 1804 09/18/17 0227  WBC 10.5   < > 11.2* 13.1* 12.2* 9.0 9.2  NEUTROABS 6.7  --   --   --   --   --   --   HGB 8.4*   < >  9.2* 9.9* 9.0* 8.6* 9.0*  HCT 27.5*   < > 29.3* 31.7* 28.3* 27.0* 27.8*  MCV 83.6   < > 82.8 84.8 83.7 82.8 82.5  PLT 331   < > 369 398 393 391 422*   < > = values in this interval not displayed.   Cardiac Enzymes: Recent Labs  Lab 09/14/17 0243 09/17/17 0042 09/17/17 0153 09/17/17 0834 09/17/17 1405  TROPONINI 5.28* 2.36* 1.97* 2.53* 2.62*   CBG: Recent Labs  Lab 09/17/17 1942 09/17/17 2315 09/18/17 0008 09/18/17 0316 09/18/17 0745  GLUCAP 160* 187* 182* 198* 167*    Iron Studies:  Recent Labs    09/17/17 0407  IRON 19*  TIBC 169*  FERRITIN 235   Studies/Results: Dg Chest Port 1  View  Result Date: 09/17/2017 CLINICAL DATA:  Post CPR EXAM: PORTABLE CHEST 1 VIEW COMPARISON:  09/11/2017 FINDINGS: Cardiomegaly with vascular congestion and moderate pulmonary edema. Atelectasis or pneumonia at the left base. Small pleural effusions. No pneumothorax. IMPRESSION: Cardiomegaly with vascular congestion and moderate pulmonary edema and small pleural effusion. Atelectasis or pneumonia at the left lung base. Electronically Signed   By: Donavan Foil M.D.   On: 09/17/2017 02:10   . aspirin EC  81 mg Oral Daily  . atorvastatin  80 mg Oral q1800  . carvedilol  6.25 mg Oral BID WC  . ferrous sulfate  325 mg Oral TID WC  . insulin aspart  0-15 Units Subcutaneous Q4H  . isosorbide-hydrALAZINE  0.5 tablet Oral BID  . mouth rinse  15 mL Mouth Rinse BID  . pantoprazole  40 mg Oral Q0600  . sodium chloride flush  3 mL Intravenous Q12H  . ticagrelor  90 mg Oral BID    BMET    Component Value Date/Time   NA 137 09/18/2017 0227   NA 138 09/18/2017 0227   NA 141 08/27/2017 1134   K 3.4 (L) 09/18/2017 0227   K 3.4 (L) 09/18/2017 0227   CL 102 09/18/2017 0227   CL 103 09/18/2017 0227   CO2 24 09/18/2017 0227   CO2 23 09/18/2017 0227   GLUCOSE 180 (H) 09/18/2017 0227   GLUCOSE 181 (H) 09/18/2017 0227   BUN 52 (H) 09/18/2017 0227   BUN 52 (H) 09/18/2017 0227   BUN 32 (H)  08/27/2017 1134   CREATININE 4.60 (H) 09/18/2017 0227   CREATININE 4.61 (H) 09/18/2017 0227   CALCIUM 7.7 (L) 09/18/2017 0227   CALCIUM 7.7 (L) 09/18/2017 0227   GFRNONAA 13 (L) 09/18/2017 0227   GFRNONAA 13 (L) 09/18/2017 0227   GFRAA 16 (L) 09/18/2017 0227   GFRAA 15 (L) 09/18/2017 0227   CBC    Component Value Date/Time   WBC 9.2 09/18/2017 0227   RBC 3.37 (L) 09/18/2017 0227   HGB 9.0 (L) 09/18/2017 0227   HCT 27.8 (L) 09/18/2017 0227   HCT 29.2 (L) 07/25/2017 0346   PLT 422 (H) 09/18/2017 0227   MCV 82.5 09/18/2017 0227   MCH 26.7 09/18/2017 0227   MCHC 32.4 09/18/2017 0227   RDW 16.5 (H) 09/18/2017 0227   LYMPHSABS 2.7 09/11/2017 1318   MONOABS 1.0 09/11/2017 1318   EOSABS 0.1 09/11/2017 1318   BASOSABS 0.0 09/11/2017 1318     Assessment/Plan: 1. Non-oliguric AKI/CKD stage 3 due to IV contrast with underlying diabetic nephropathy. Cr peaked at 6.15 but improving to 4.85 but then increased again following code. Thankfully his creat has continued to improve despite having another cath and contrast load.  No indication for dialysis at this time.  Will continue to follow as it may take up to 48 hours after contrast to see injury. 2. Multi-vessel CAD s/p acute Inferolateral MI s/p DES to OM2. Also with LAD lesions 80 and 85%, prox and mid respectively s/p PCI's 09/17/17 with good results.  IABP has been discontinued.  Feels better and is without his normal angina. 3. Anemia of CKD stage 3- check iron stores and consider ESA 4. DM- per primary 5. HTN- stable 6. CHF- diuresed well, follow I's/O's and daily creatinine.   Donetta Potts, MD [employer] [phone]

## 2017-09-18 NOTE — Progress Notes (Signed)
Initial Pulmonary/Critical Care Consultation  Patient Name: Tommy Mclaughlin MRN: [id number] DOB: 11-05-1965    ADMISSION DATE:  09/11/2017 CONSULTATION DATE:  09/17/2017  REFERRING MD:  Dr. Charolette Forward  REASON FOR CONSULTATION:  Post-CPR resuscitation   HISTORY OF PRESENT ILLNESS  [name] reformed smoker is seen in consultation at the request of Dr. Charolette Forward for recommendations on further evaluation and management of post-CPR resuscitation.  The patient has been successfully resuscitated after pulseless ventricular tachycardia.  Case was discussed with the code team (Dr. Velna Ochs) with ROSC in under 2 minutes..  Post-CPR, the patient is arousable.  He is answering questions appropriately.  In fact, the patient is able to follow commands and provide his medical history.  Consultation request was placed by phone from Dr. Terrence Dupont to this service for intubation.  Patient was admitted on 09/11/2017 with complaints of lower extremity pain, lower extremity edema and increasing dyspnea.  This was associated with retrosternal chest pain.  EKG in the emergency department revealed septal Q waves and ST elevation in the inferolateral distribution, for which she was emergently taken to the cardiac catheterization lab.  Based on conversation with Dr. Terrence Dupont, stent was deployed to the [place].  He had additional lesions identified in the left anterior descending artery, which will require subsequent intervention.  The patient did present with acute kidney injury.  Case was discussed with Dr. Terrence Dupont.  They were awaiting improvement in renal function prior to returning to the Cath Lab.  Nursing staff reports that the patient had bigeminy and runs of nonsustained ventricular tachycardia during day shift.  The patient apparently had an amiodarone bolus ordered this evening, after his rhythm failed to improve with p.o. amiodarone.  Just as the amiodarone bolus was being  administered, the patient experienced pulseless ventricular tachycardia.  SUBJECTIVE/INTERVAL: back to cath lab 7/2 for PCI to LAD (2 stents) and insertion of IABP. No further events.  Renal function stable post cath lab.  VITAL SIGNS: BP 121/78 (BP Location: Left Arm)   Pulse 66   Temp (!) 96.7 F (35.9 C) (Oral)   Resp 14   Ht 6\' 2"  (1.88 m)   Wt 108.5 kg (239 lb 3.2 oz)   SpO2 100%   BMI 30.71 kg/m    INTAKE / OUTPUT: I/O last 3 completed shifts: In: 4055.4 [P.O.:120; I.V.:3785.4; IV Piggyback:150] Out: 1845 [Urine:1845]  PHYSICAL EXAM:  General:  [name], watching TV, in NAD Neuro:  A&O x 3, non-focal HEENT:  [place]/AT, EOMI, MMM Cardiovascular:  RRR, no MRG Lungs:  Clear, bilateral breath sounds Abdomen:  Soft, non-distended, non-tender.  Musculoskeletal:  No acute deformity, 1+ edema Skin:  Intact, warm, dry   LABS:  ABG Recent Labs  Lab 09/17/17 0054  PHART 7.357  PCO2ART 27.6*  PO2ART 257.0*    Cardiac Enzymes Recent Labs  Lab 09/17/17 0153 09/17/17 0834 09/17/17 1405  TROPONINI 1.97* 2.53* 2.62*    BASIC METABOLIC PROFILE Recent Labs  Lab 09/15/17 0236 09/16/17 0239 09/16/17 1550  09/17/17 0153 09/17/17 0407 09/17/17 1804 09/18/17 0227  NA 134* 137 139   < >  --  138 138 137  138  K 3.4* 3.8 4.2   < >  --  4.1 3.7 3.4*  3.4*  CL 101 103 104   < >  --  107 104 102  103  CO2 20* 22 19*   < >  --  21* 22 24  23   BUN 55*  55* 56*   < >  --  55* 53* 52*  52*  CREATININE 6.15* 5.75* 5.48*   < >  --  5.22* 4.86* 4.60*  4.61*  GLUCOSE 140* 181* 179*   < >  --  218* 169* 180*  181*  CALCIUM 7.4* 7.6* 7.8*   < >  --  7.1* 7.6* 7.7*  7.7*  MG  --  2.2 2.1  --  2.0  --   --   --   PHOS 4.5 4.9*  --   --   --   --   --  4.1   < > = values in this interval not displayed.    Glucose Recent Labs  Lab 09/17/17 1551 09/17/17 1942 09/17/17 2315 09/18/17 0008 09/18/17 0316 09/18/17 0745  GLUCAP 152* 160* 187* 182* 198* 167*      Liver Enzymes Recent Labs  Lab 09/16/17 0239 09/17/17 0042 09/18/17 0227  AST  --  31  --   ALT  --  6  --   ALKPHOS  --  195*  --   BILITOT  --  0.7  --   ALBUMIN 1.5* 1.7* 1.7*    CBC Recent Labs  Lab 09/17/17 0407 09/17/17 1804 09/18/17 0227  WBC 12.2* 9.0 9.2  HGB 9.0* 8.6* 9.0*  HCT 28.3* 27.0* 27.8*  PLT 393 391 422*    COAGULATION STUDIES Recent Labs  Lab 09/11/17 1318  INR 1.12    SEPSIS MARKERS Recent Labs  Lab 09/17/17 0042 09/17/17 0153 09/17/17 0407  LATICACIDVEN 2.9* 2.1* 1.2    CULTURES: Results for orders placed or performed during the hospital encounter of 09/11/17  MRSA PCR Screening     Status: None   Collection Time: 09/11/17  8:47 PM  Result Value Ref Range Status   MRSA by PCR NEGATIVE NEGATIVE Final    Comment:        The GeneXpert MRSA Assay (FDA approved for NASAL specimens only), is one component of a comprehensive MRSA colonization surveillance program. It is not intended to diagnose MRSA infection nor to guide or monitor treatment for MRSA infections. Performed at [facility], [place] [address] [place],  [address]     OTHER STUDIES:  EKG (09/17/2017) demonstrates interval T wave flattening and inversion in all the anterior leads.  Persistent (albeit essentially unchanged) ST elevation in limb leads II, III and aVF.  SIGNIFICANT EVENTS: 6/26 > Admitted with acute ST elevation myocardial infarction.  Emergent cardiac catheterization resulting in stent deployment to OM1.  Identification of additional critical stenosis in the LAD. 7/1 > CODE BLUE for pulseless ventricular tachycardia.  ROSC in less than 2 minutes. 7/2 > back to cath lab > PCI to LAD with 2 stents and IABP placed.   ASSESSMENT / PLAN: Active Problems:   AKI (acute kidney injury) ([facility])   Acute MI, inferolateral wall (HCC)   Acute hypoxemic respiratory failure (HCC)   Pulseless ventricular tachycardia (HCC)   Acute on chronic systolic and  diastolic heart failure, NYHA class 3 (HCC)   Acute renal failure superimposed on stage 4 chronic kidney disease (HCC)   High anion gap metabolic acidosis   CARDIOVASCULAR: Pulseless ventricular tachycardia, successfully resuscitated (ROSC in < 2 minutes) - s/p repeat PCI 7/2 with 2 stents and IABP placed Acute ST elevation myocardial infarction Chronic systolic and diastolic heart failure, LVEF 30-35% on 2D echo (07/23/2017) -Continue current plan of care per cards / EP -Likely to get ICD prior  to d/c  RENAL Acute on chronic kidney disease, (stage IV at baseline) - stable post cath lab 7/2 Anion gap metabolic acidosis, likely secondary to uremia /- lactic.  Resolved 7/3 Hypokalemia - s/p repletion 7/3 -Can d/c bicarb -Nephrology following -Follow BMP  PULMONARY Acute hypoxemic respiratory failure History of tobacco abuse -Titrate supplemental oxygen to maintain SpO2 90+%. -This patient should be advised to undergo diagnostic polysomnography as an outpatient.  ENDOCRINE Type 2 diabetes mellitus with renal complication -Sliding scale insulin.  GASTROINTESTINAL GI prophylaxis: Protonix  HEMATOLOGIC DVT prophylaxis: On Heparin infusion   FAMILY  - Updates: patient updated bedside. Expressing concern with his overall level of health and desire to not go through all this again. I stressed that he is a young guy and has treatable conditions and with proper treatment he could have a relatively good quality of life. He is still feeling like he may not want to go through with aggressive measures. Back to cath lab 7/2 and had 2 stents and IABP placed. Palliative care consulted 7/2 but held off after discussions with Dr. Terrence Dupont.  - Inter-disciplinary family meet or Palliative Care meeting due by: 09/23/2017   Nothing further to add.  PCCM will sign off.  Please do not hesitate to call us back if we can be of any further assistance.   Montey Hora, [facility] Pager: [phone]  or [phone] 09/18/2017, 8:51 AM

## 2017-09-18 NOTE — Progress Notes (Signed)
Subjective:  Complains of left-sided musculoskeletal pain.  Denies any anginal chest pain.  States overall feels well.  No further episodes of V. Tach. Objective:  Vital Signs in the last 24 hours: Temp:  [96.7 F (35.9 C)-98.6 F (37 C)] 96.7 F (35.9 C) (07/03 0745) Pulse Rate:  [0-126] 66 (07/03 0800) Resp:  [0-53] 14 (07/03 0800) BP: (100-152)/(60-94) 121/78 (07/03 0800) SpO2:  [0 %-100 %] 100 % (07/03 0800) Arterial Line BP: (117-143)/(53-67) 126/60 (07/02 1945) Weight:  [108.5 kg (239 lb 3.2 oz)] 108.5 kg (239 lb 3.2 oz) (07/03 0500)  Intake/Output from previous day: 07/02 0701 - 07/03 0700 In: 2265.7 [P.O.:120; I.V.:2045.7; IV Piggyback:100] Out: 780 [Urine:780] Intake/Output from this shift: Total I/O In: 68.7 [I.V.:68.7] Out: 350 [Urine:350]  Physical Exam: Neck: no adenopathy, no carotid bruit, no JVD and supple, symmetrical, trachea midline Lungs: decreased breath sounds at bases Heart: regular rate and rhythm, S1, S2 normal and soft systolic murmur noted Abdomen: soft, non-tender; bowel sounds normal; no masses,  no organomegaly Extremities: no clubbing, cyanosis, trace edema noted in both groins.  Dressing dry.  No hematoma, stable  Lab Results: Recent Labs    09/17/17 1804 09/18/17 0227  WBC 9.0 9.2  HGB 8.6* 9.0*  PLT 391 422*   Recent Labs    09/17/17 1804 09/18/17 0227  NA 138 137  138  K 3.7 3.4*  3.4*  CL 104 102  103  CO2 22 24  23   GLUCOSE 169* 180*  181*  BUN 53* 52*  52*  CREATININE 4.86* 4.60*  4.61*   Recent Labs    09/17/17 0834 09/17/17 1405  TROPONINI 2.53* 2.62*   Hepatic Function Panel Recent Labs    09/17/17 0042 09/18/17 0227  PROT 5.5*  --   ALBUMIN 1.7* 1.7*  AST 31  --   ALT 6  --   ALKPHOS 195*  --   BILITOT 0.7  --    No results for input(s): CHOL in the last 72 hours. No results for input(s): PROTIME in the last 72 hours.  Imaging: Imaging results have been reviewed and Dg Chest Port 1  View  Result Date: 09/17/2017 CLINICAL DATA:  Post CPR EXAM: PORTABLE CHEST 1 VIEW COMPARISON:  09/11/2017 FINDINGS: Cardiomegaly with vascular congestion and moderate pulmonary edema. Atelectasis or pneumonia at the left base. Small pleural effusions. No pneumothorax. IMPRESSION: Cardiomegaly with vascular congestion and moderate pulmonary edema and small pleural effusion. Atelectasis or pneumonia at the left lung base. Electronically Signed   By: Donavan Foil M.D.   On: 09/17/2017 02:10    Cardiac Studies:  Assessment/Plan:  Status post sustained V. Tach cardiac arrest Status post inferolateral wall myocardial infarction status post PCI to OM 2 Multivessel CAD with critical LAD stenosis Kstatus post PTCA stenting to mid and distal LAD with excellent angiographic results and intra-aortic balloon pump insertion Ischemic cardiomyopathy Metabolic acidosis Hypertensive heart disease with systolic dysfunction Decompensated systolic congestive heart failure Insulin requiring diabetes mellitus History of right CVA with left paresis History of tobacco abuse Acute on chronic kidney injury stage IV secondary to contrast improving Anemia of chronic disease Peripheral vascular disease Positive family history of coronary artery disease Hypokalemia. Plan K has been replaced. Start low-dose BiDil as per orders Part of bed to chair. Phase I cardiac rehabilitation. Ambulate as tolerated. DC Foley. Check labs in a.m.  LOS: 7 days    Charolette Forward 09/18/2017, 8:48 AM

## 2017-09-18 NOTE — Progress Notes (Signed)
No charge note.  Received palliative consultation.  Reviewed the chart.  Spoke with Dr. Terrence Dupont who would prefer to cancel the consultation for now.    Please re-consult if we can be of any assistance with [name] or his family.  Thanks,  Florentina Jenny, PA-C Palliative Medicine Pager: [phone]

## 2017-09-18 NOTE — Progress Notes (Signed)
[place], MD called and notified regarding K+ 3.4 Verbal orders to give 18meq of K+ PO once. Will initiate orders and continue to monitor patient.

## 2017-09-18 NOTE — Progress Notes (Signed)
Per insurance check on Brilinta  # 2. [facility]   BRILINTA 90 MG BID  COVER- YES  CO-PAY- $ 3.80  TIER- NO  PRIOR APPROVAL- NO   PREFERRED PHARMACY :  [facility]

## 2017-09-18 NOTE — Progress Notes (Addendum)
Progress Note  Patient Name: Tommy Mclaughlin Date of Encounter: 09/18/2017  Primary Cardiologist: No primary care provider on file.   Subjective   Feeling much better today, remains with chest wall tenderness, no anginal sounding symptoms, no SOB  Inpatient Medications    Scheduled Meds: . aspirin EC  81 mg Oral Daily  . atorvastatin  80 mg Oral q1800  . carvedilol  6.25 mg Oral BID WC  . insulin aspart  0-15 Units Subcutaneous Q4H  . mouth rinse  15 mL Mouth Rinse BID  . pantoprazole  40 mg Oral Q0600  . sodium chloride flush  3 mL Intravenous Q12H  . ticagrelor  90 mg Oral BID   Continuous Infusions: . sodium chloride 250 mL (09/18/17 0402)  . amiodarone 30 mg/hr (09/18/17 0745)  .  ceFAZolin (ANCEF) IV Stopped (09/18/17 0432)  . lidocaine 1 mg/min (09/18/17 0745)  . nitroGLYCERIN Stopped (09/18/17 0419)  .  sodium bicarbonate  infusion 1000 mL 60 mL/hr at 09/18/17 0745   PRN Meds: sodium chloride, acetaminophen, ALPRAZolam, bacitracin, fentaNYL (SUBLIMAZE) injection, nitroGLYCERIN, ondansetron (ZOFRAN) IV, polyethylene glycol, sodium chloride flush   Vital Signs    Vitals:   09/18/17 0515 09/18/17 0600 09/18/17 0700 09/18/17 0745  BP: 116/77 104/68 117/78   Pulse: 66 67 64 65  Resp: 12 16 20 15   Temp:    (!) 96.7 F (35.9 C)  TempSrc:    Oral  SpO2:  100% 99% 100%  Weight:      Height:        Intake/Output Summary (Last 24 hours) at 09/18/2017 0809 Last data filed at 09/18/2017 0745 Gross per 24 hour  Intake 2334.46 ml  Output 780 ml  Net 1554.46 ml   Filed Weights   09/16/17 0422 09/17/17 0100 09/18/17 0500  Weight: 231 lb 11.3 oz (105.1 kg) 231 lb 11.3 oz (105.1 kg) 239 lb 3.2 oz (108.5 kg)    Telemetry    SR, occ PVCs - Personally Reviewed  ECG    SR, 65bpm, I measure QT shorter - Personally Reviewed  Physical Exam   GEN: No acute distress.   Neck: No JVD Cardiac: RRR, no murmurs, rubs, or gallops.  Respiratory:  CTA b/l, . GI: Soft,  nontender, non-distended  MS: No edema; No deformity. Neuro:  Nonfocal  Psych: Normal affect   Labs    Chemistry Recent Labs  Lab 09/16/17 0239  09/17/17 0042 09/17/17 0407 09/17/17 1804 09/18/17 0227  NA 137   < > 138 138 138 137  138  K 3.8   < > 4.0 4.1 3.7 3.4*  3.4*  CL 103   < > 107 107 104 102  103  CO2 22   < > 15* 21* 22 24  23   GLUCOSE 181*   < > 182* 218* 169* 180*  181*  BUN 55*   < > 51* 55* 53* 52*  52*  CREATININE 5.75*   < > 4.87* 5.22* 4.86* 4.60*  4.61*  CALCIUM 7.6*   < > 7.0* 7.1* 7.6* 7.7*  7.7*  PROT  --   --  5.5*  --   --   --   ALBUMIN 1.5*  --  1.7*  --   --  1.7*  AST  --   --  31  --   --   --   ALT  --   --  6  --   --   --   ALKPHOS  --   --  195*  --   --   --   BILITOT  --   --  0.7  --   --   --   GFRNONAA 10*   < > 12* 11* 13* 13*  13*  GFRAA 12*   < > 14* 13* 15* 16*  15*  ANIONGAP 12   < > 16* 10 12 11  12    < > = values in this interval not displayed.     Hematology Recent Labs  Lab 09/17/17 0407 09/17/17 1804 09/18/17 0227  WBC 12.2* 9.0 9.2  RBC 3.38* 3.26* 3.37*  HGB 9.0* 8.6* 9.0*  HCT 28.3* 27.0* 27.8*  MCV 83.7 82.8 82.5  MCH 26.6 26.4 26.7  MCHC 31.8 31.9 32.4  RDW 16.2* 16.4* 16.5*  PLT 393 391 422*    Cardiac Enzymes Recent Labs  Lab 09/17/17 0042 09/17/17 0153 09/17/17 0834 09/17/17 1405  TROPONINI 2.36* 1.97* 2.53* 2.62*    Recent Labs  Lab 09/11/17 1539  TROPIPOC 8.83*     BNP Recent Labs  Lab 09/11/17 1318  BNP >4,500.0*     DDimer No results for input(s): DDIMER in the last 168 hours.   Radiology    Dg Chest Port 1 View Result Date: 09/17/2017 CLINICAL DATA:  Post CPR EXAM: PORTABLE CHEST 1 VIEW COMPARISON:  09/11/2017 FINDINGS: Cardiomegaly with vascular congestion and moderate pulmonary edema. Atelectasis or pneumonia at the left base. Small pleural effusions. No pneumothorax. IMPRESSION: Cardiomegaly with vascular congestion and moderate pulmonary edema and small pleural  effusion. Atelectasis or pneumonia at the left lung base. Electronically Signed   By: Donavan Foil M.D.   On: 09/17/2017 02:10    Cardiac Studies   09/17/17 LHC/PCI, IABP insertion  Prox LAD lesion is 80% stenosed.  Dist LAD lesion is 85% stenosed.  Previously placed Ost 2nd Mrg to 2nd Mrg drug eluting stent is widely patent.  Balloon angioplasty was performed.  Previously placed 2nd Mrg drug eluting stent is widely patent.  Balloon angioplasty was performed.  Mid Cx to Dist Cx lesion is 30% stenosed.  A drug-eluting stent was successfully placed using a STENT SIERRA 2.75 X 18 MM.  Post intervention, there is a 0% residual stenosis.  A drug-eluting stent was successfully placed using a STENT SIERRA 3.25 X 38 MM.  Post intervention, there is a 0% residual stenosis.  09/17/17: TTE Study Conclusions - Left ventricle: The cavity size was moderately dilated. Wall thickness was normal. Systolic function was moderately to severely reduced. The estimated ejection fraction was in the range of 30% to 35%. There is akinesis of the apical myocardium. Features are consistent with a pseudonormal left ventricular filling pattern, with concomitant abnormal relaxation and increased filling pressure (grade 2 diastolic dysfunction). Doppler parameters are consistent with high ventricular filling pressure. - Aortic valve: There was trivial regurgitation. - Mitral valve: Calcified annulus. There was mild regurgitation. - Left atrium: The atrium was mildly dilated. - Pulmonary arteries: PA peak pressure: 35 mm Hg (S). - Pericardium, extracardiac: A trivial pericardial effusion was identified. There was a left pleural effusion. Impressions: - Akinesis of the apex with overall moderate to severe LV dysfunction; moderate diastolic dysfunction; moderate LVE; trace AI; mild MR; mild LAE.  09/11/17: LHC/PCI  Ost 2nd Mrg to 2nd Mrg lesion is 100% stenosed.  Prox LAD  lesion is 80% stenosed.  Dist LAD lesion is 85% stenosed.  Mid Cx to Dist Cx lesion is 30% stenosed.  A drug-eluting stent was successfully placed using  a STENT SIERRA 2.25 X 28 MM.  Post intervention, there is a 0% residual stenosis.  2nd Mrg lesion is 100% stenosed.  A drug-eluting stent was successfully placed using a STENT RESOLUTE ONYX 2.0X22.  Post intervention, there is a 0% residual stenosis.  07/23/17: TTE Study Conclusions - Left ventricle: The cavity size was normal. Wall thickness was increased in a pattern of mild LVH. Systolic function was moderately to severely reduced. The estimated ejection fraction was in the range of 30% to 35%. Diffuse hypokinesis. Doppler parameters are consistent with abnormal left ventricular relaxation (grade 1 diastolic dysfunction). Doppler parameters are consistent with high ventricular filling pressure. Impressions: - Moderate to severe global reduction in LV systolic function; mild diastolic dysfunction with elevated LV filling pressure; mild LVH.   Patient Profile     52 y.o. male with a hx of HTN, hypertensive heart disease, CM, IDDM, CVA (old) w/L hemiparesis, CKD (III), chronic anemia, admitted to [facility] 09/11/17 with STEMI (infero-lateral), he was brought to the cath lab emergently with PCI (x2 stents) to a 100% occl OM2, peak Trop was 6.24, he was transfused for Hgb of 7.7 on 09/12/17.  Planned for staged intervention of LAD once renal function stabilized, nephrology was brought on board.  09/16/17  he was observed to have developed frequent V ectopy and started on PO amiodarone, and eventually IV ordered with NSVT runs, while amio bolus was in process early 09/17/17 AM the patient suffered a VT arrest requiring CPR and one shock and ROSC in under 2 minutes. He was brought back to the cath lab 09/17/17 morning underwent PCI to LAD and insertion of IABP.  IABP has subsequently been removed No pressors  This AM on  Lidocaine gtt and amiodarone gtt  Assessment & Plan    1. VT arrest early 09/17/17 AM     Initially was MMVT     occurred 6 days s/p STEMI (inf-lat) with PCI to OM2 (x2 stents)     Had LAD intervention (lesion described as critical) 09/17/17 AM (2 stents) w/IABP  Post arrest echo with similar LVEF 30-35%, though new WMA w/apical akinesis Suspect VT arrest 2/2 ischemia Anticipate placing ICD prior to discharge  No further VT, occ PVCs only on telemetry The patient has been seen/examined by Dr. Lovena Le this AM Would stop lidocaine gtt today, keep amiodarone gtt for now, possibly transition to PO tomorrow if no further VT in the next 24 hours  Continue ongoing management with Dr. Terrence Dupont     For questions or updates, please contact [facility] Please consult [email or web address] for contact info under Cardiology/STEMI.      Signed, Baldwin [place], PA-C  09/18/2017, 8:09 AM    EP Attending Patient seen and examined. Agree with above. He has had no additional arrhythmias. He will continue IV amio. Ok to stop IV lidocaine. If no more VT would switch to oral amio tomorrow. I would anticipate a Life vest at discharge.   Mikle Bosworth.D.

## 2017-09-19 DIAGNOSIS — I5043 Acute on chronic combined systolic (congestive) and diastolic (congestive) heart failure: Secondary | ICD-10-CM

## 2017-09-19 LAB — CBC
HEMATOCRIT: 26.4 % — AB (ref 39.0–52.0)
Hemoglobin: 8.4 g/dL — ABNORMAL LOW (ref 13.0–17.0)
MCH: 26.4 pg (ref 26.0–34.0)
MCHC: 31.8 g/dL (ref 30.0–36.0)
MCV: 83 fL (ref 78.0–100.0)
PLATELETS: 374 10*3/uL (ref 150–400)
RBC: 3.18 MIL/uL — AB (ref 4.22–5.81)
RDW: 16.3 % — AB (ref 11.5–15.5)
WBC: 10.1 10*3/uL (ref 4.0–10.5)

## 2017-09-19 LAB — GLUCOSE, CAPILLARY
GLUCOSE-CAPILLARY: 122 mg/dL — AB (ref 70–99)
GLUCOSE-CAPILLARY: 216 mg/dL — AB (ref 70–99)
Glucose-Capillary: 115 mg/dL — ABNORMAL HIGH (ref 70–99)
Glucose-Capillary: 132 mg/dL — ABNORMAL HIGH (ref 70–99)
Glucose-Capillary: 228 mg/dL — ABNORMAL HIGH (ref 70–99)

## 2017-09-19 LAB — BASIC METABOLIC PANEL
Anion gap: 10 (ref 5–15)
BUN: 50 mg/dL — ABNORMAL HIGH (ref 6–20)
CALCIUM: 7.5 mg/dL — AB (ref 8.9–10.3)
CO2: 25 mmol/L (ref 22–32)
CREATININE: 4.18 mg/dL — AB (ref 0.61–1.24)
Chloride: 102 mmol/L (ref 98–111)
GFR calc non Af Amer: 15 mL/min — ABNORMAL LOW (ref >60)
GFR, EST AFRICAN AMERICAN: 17 mL/min — AB (ref >60)
Glucose, Bld: 130 mg/dL — ABNORMAL HIGH (ref 70–99)
Potassium: 3.7 mmol/L (ref 3.5–5.1)
Sodium: 137 mmol/L (ref 135–145)

## 2017-09-19 MED ORDER — TAMSULOSIN HCL 0.4 MG PO CAPS
0.4000 mg | ORAL_CAPSULE | Freq: Every day | ORAL | Status: DC
  Administered 2017-09-19 – 2017-09-25 (×7): 0.4 mg via ORAL
  Filled 2017-09-19 (×7): qty 1

## 2017-09-19 MED ORDER — SODIUM CHLORIDE 0.9 % IV SOLN
510.0000 mg | INTRAVENOUS | Status: DC
  Administered 2017-09-19: 510 mg via INTRAVENOUS
  Filled 2017-09-19: qty 17

## 2017-09-19 NOTE — Progress Notes (Signed)
S: Complaining of urinary retention this morning, "I can't go" O:BP (!) 107/58   Pulse 79   Temp 98.4 F (36.9 C) (Oral)   Resp 13   Ht 6\' 2"  (1.88 m)   Wt 108.9 kg (240 lb 1.3 oz)   SpO2 98%   BMI 30.82 kg/m   Intake/Output Summary (Last 24 hours) at 09/19/2017 1025 Last data filed at 09/19/2017 0900 Gross per 24 hour  Intake 607.45 ml  Output 1950 ml  Net -1342.55 ml   Intake/Output: I/O last 3 completed shifts: In: 2065 [P.O.:240; I.V.:1675; IV Piggyback:150] Out: 1093 [Urine:1470]  Intake/Output this shift:  Total I/O In: -  Out: 1350 [Urine:1350] Weight change: 0.4 kg (14.1 oz) Gen:NAD CVS: no rub Resp: cta Abd: +BS, soft, large suprapubic mass causing urge to urinate when palpated Ext: trace edema  Recent Labs  Lab 09/15/17 0236 09/16/17 0239 09/16/17 1550 09/17/17 0042 09/17/17 0407 09/17/17 1804 09/18/17 0227 09/19/17 0215  NA 134* 137 139 138 138 138 137  138 137  K 3.4* 3.8 4.2 4.0 4.1 3.7 3.4*  3.4* 3.7  CL 101 103 104 107 107 104 102  103 102  CO2 20* 22 19* 15* 21* 22 24  23 25   GLUCOSE 140* 181* 179* 182* 218* 169* 180*  181* 130*  BUN 55* 55* 56* 51* 55* 53* 52*  52* 50*  CREATININE 6.15* 5.75* 5.48* 4.87* 5.22* 4.86* 4.60*  4.61* 4.18*  ALBUMIN 1.6* 1.5*  --  1.7*  --   --  1.7*  --   CALCIUM 7.4* 7.6* 7.8* 7.0* 7.1* 7.6* 7.7*  7.7* 7.5*  PHOS 4.5 4.9*  --   --   --   --  4.1  --   AST  --   --   --  31  --   --   --   --   ALT  --   --   --  6  --   --   --   --    Liver Function Tests: Recent Labs  Lab 09/16/17 0239 09/17/17 0042 09/18/17 0227  AST  --  31  --   ALT  --  6  --   ALKPHOS  --  195*  --   BILITOT  --  0.7  --   PROT  --  5.5*  --   ALBUMIN 1.5* 1.7* 1.7*   No results for input(s): LIPASE, AMYLASE in the last 168 hours. No results for input(s): AMMONIA in the last 168 hours. CBC: Recent Labs  Lab 09/17/17 0042 09/17/17 0407 09/17/17 1804 09/18/17 0227 09/19/17 0215  WBC 13.1* 12.2* 9.0 9.2 10.1  HGB  9.9* 9.0* 8.6* 9.0* 8.4*  HCT 31.7* 28.3* 27.0* 27.8* 26.4*  MCV 84.8 83.7 82.8 82.5 83.0  PLT 398 393 391 422* 374   Cardiac Enzymes: Recent Labs  Lab 09/14/17 0243 09/17/17 0042 09/17/17 0153 09/17/17 0834 09/17/17 1405  TROPONINI 5.28* 2.36* 1.97* 2.53* 2.62*   CBG: Recent Labs  Lab 09/18/17 1546 09/18/17 1926 09/18/17 2335 09/19/17 0404 09/19/17 0726  GLUCAP 173* 226* 132* 115* 122*    Iron Studies:  Recent Labs    09/17/17 0407  IRON 19*  TIBC 169*  FERRITIN 235   Studies/Results: No results found. Marland Kitchen aspirin EC  81 mg Oral Daily  . atorvastatin  80 mg Oral q1800  . carvedilol  6.25 mg Oral BID WC  . ferrous sulfate  325 mg Oral TID WC  . insulin  aspart  0-15 Units Subcutaneous Q4H  . isosorbide-hydrALAZINE  0.5 tablet Oral BID  . mouth rinse  15 mL Mouth Rinse BID  . pantoprazole  40 mg Oral Q0600  . sodium chloride flush  3 mL Intravenous Q12H  . ticagrelor  90 mg Oral BID    BMET    Component Value Date/Time   NA 137 09/19/2017 0215   NA 141 08/27/2017 1134   K 3.7 09/19/2017 0215   CL 102 09/19/2017 0215   CO2 25 09/19/2017 0215   GLUCOSE 130 (H) 09/19/2017 0215   BUN 50 (H) 09/19/2017 0215   BUN 32 (H) 08/27/2017 1134   CREATININE 4.18 (H) 09/19/2017 0215   CALCIUM 7.5 (L) 09/19/2017 0215   GFRNONAA 15 (L) 09/19/2017 0215   GFRAA 17 (L) 09/19/2017 0215   CBC    Component Value Date/Time   WBC 10.1 09/19/2017 0215   RBC 3.18 (L) 09/19/2017 0215   HGB 8.4 (L) 09/19/2017 0215   HCT 26.4 (L) 09/19/2017 0215   HCT 29.2 (L) 07/25/2017 0346   PLT 374 09/19/2017 0215   MCV 83.0 09/19/2017 0215   MCH 26.4 09/19/2017 0215   MCHC 31.8 09/19/2017 0215   RDW 16.3 (H) 09/19/2017 0215   LYMPHSABS 2.7 09/11/2017 1318   MONOABS 1.0 09/11/2017 1318   EOSABS 0.1 09/11/2017 1318   BASOSABS 0.0 09/11/2017 1318     Assessment/Plan: 1. Non-oliguric AKI/CKD stage 3 due to IV contrast with underlying diabetic nephropathy. Cr peaked at 6.15 but  improving to4.85 but then increased again following code and is now down to 4.18. Thankfully his creat has continued to improve despite having another cath and contrast load.  No indication for dialysis at this time.  2. Urinary retention/BOO- RN to place foley catheter and follow. 3. Multi-vessel CAD s/p acute Inferolateral MI s/p DES to OM2. Also with LAD lesions 80 and 85%, prox and mid respectively s/p PCI's 09/17/17 with good results.  IABP has been discontinued.  Feels better and is without his normal angina. 4. Anemia of CKD stage 3- iron deficiency and will plan on giving IV Feraheme and follow.  Will also consider ESA 5. DM- per primary 6. HTN- stable 7. CHF- diuresed well, follow I's/O's and daily creatinine.   Donetta Potts, MD [employer] [phone]

## 2017-09-19 NOTE — Evaluation (Signed)
Physical Therapy Evaluation Patient Details Name: Tommy Mclaughlin MRN: [id number] DOB: 09-29-1965 Today's Date: 09/19/2017   History of Present Illness  52 y.o. male admitted 6/26 with chest pain SOB and leg pain, code STEMI was called and had emergency PCI. Subsequently had VT cardiac arrest requiring CPR and defibrillation on 7/2. PMH includes: TD2DM, CVA with L sided residual weakness, CHF, HTN,     Clinical Impression  Pt admitted with above diagnosis. Pt currently with functional limitations due to the deficits listed below (see PT Problem List). PTA, patient reporting that he was living with his cousin in 1 level home with 4 stairs to enter, working security, ambulating without AD mostly, with intermittent use of RW or SPC if he was feeling weak. Upon eval pt presents with weakness and decreased activity tolerance. Ambulating unit with RW on RA, VSS. Fatigues after 200' and requires rest break. Reports falls in past at times he was overdoing things, discussed importance of rest breaks, benefits of HHPT while he awaits OP cardiac rehab placement. " I will do whatever I can to get better".   No overt LOB during visit, min guard level for mobility and will need to work with PT on stairs for home entry before safely returning home when medically stable. Pt will benefit from skilled PT to increase their independence and safety with mobility to allow discharge to the venue listed below.       Follow Up Recommendations Home health PT(OP cardiac rehab )    Equipment Recommendations  (Tub Bench)    Recommendations for Other Services OT consult     Precautions / Restrictions Precautions Precautions: Fall Restrictions Weight Bearing Restrictions: No      Mobility  Bed Mobility               General bed mobility comments: OOB at entry  Transfers Overall transfer level: Needs assistance Equipment used: Rolling walker (2 wheeled) Transfers: Sit to/from Stand Sit to Stand:  Supervision         General transfer comment: Supervision for safety, cues for hand placement.   Ambulation/Gait Ambulation/Gait assistance: Min guard Gait Distance (Feet): 200 Feet Assistive device: Rolling walker (2 wheeled) Gait Pattern/deviations: Step-to pattern;Step-through pattern Gait velocity: decreased   General Gait Details: Patient ambulating unit on RA, VSS, chair follow eventually needed rest break due to leg weakness and fatigue. no overt LOB  Stairs            Wheelchair Mobility    Modified Rankin (Stroke Patients Only)       Balance Overall balance assessment: Mild deficits observed, not formally tested;History of Falls                                           Pertinent Vitals/Pain Pain Assessment: 0-10 Pain Score: 4  Pain Location: L side ribs Pain Descriptors / Indicators: Discomfort Pain Intervention(s): Limited activity within patient's tolerance;Monitored during session;Premedicated before session    Home Living Family/patient expects to be discharged to:: Private residence Living Arrangements: Other relatives Available Help at Discharge: Family;Available PRN/intermittently Type of Home: House Home Access: Stairs to enter Entrance Stairs-Rails: None Entrance Stairs-Number of Steps: 4 Home Layout: One level Home Equipment: Walker - 2 wheels;Cane - single point      Prior Function Level of Independence: Independent with assistive device(s)         Comments: uses cane  most frequently but sometimes RW when he feels weaker than normal L side     Hand Dominance   Dominant Hand: Left    Extremity/Trunk Assessment   Upper Extremity Assessment Upper Extremity Assessment: Overall WFL for tasks assessed;Defer to OT evaluation    Lower Extremity Assessment Lower Extremity Assessment: Overall WFL for tasks assessed(RLE > LLE)       Communication   Communication: No difficulties  Cognition Arousal/Alertness:  Awake/alert Behavior During Therapy: WFL for tasks assessed/performed Overall Cognitive Status: Within Functional Limits for tasks assessed                                 General Comments: Mild impulsivity with motoins and lack of safety awarness wanted to keep walking despite fatigue      General Comments      Exercises     Assessment/Plan    PT Assessment Patient needs continued PT services  PT Problem List Decreased strength;Decreased range of motion;Decreased balance;Decreased activity tolerance;Decreased mobility;Pain;Cardiopulmonary status limiting activity       PT Treatment Interventions DME instruction;Gait training;Stair training;Functional mobility training;Therapeutic activities;Therapeutic exercise;Balance training    PT Goals (Current goals can be found in the Care Plan section)  Acute Rehab PT Goals Patient Stated Goal: go home and get PT  PT Goal Formulation: With patient Time For Goal Achievement: 09/26/17 Potential to Achieve Goals: Good    Frequency Min 3X/week   Barriers to discharge        Co-evaluation               AM-PAC PT "6 Clicks" Daily Activity  Outcome Measure Difficulty turning over in bed (including adjusting bedclothes, sheets and blankets)?: A Lot Difficulty moving from lying on back to sitting on the side of the bed? : A Lot Difficulty sitting down on and standing up from a chair with arms (e.g., wheelchair, bedside commode, etc,.)?: A Lot Help needed moving to and from a bed to chair (including a wheelchair)?: A Little Help needed walking in hospital room?: A Little Help needed climbing 3-5 steps with a railing? : A Lot 6 Click Score: 14    End of Session Equipment Utilized During Treatment: Gait belt Activity Tolerance: Patient tolerated treatment well Patient left: in chair;with call bell/phone within reach;with family/visitor present Nurse Communication: Mobility status PT Visit Diagnosis: Unsteadiness on  feet (R26.81);Muscle weakness (generalized) (M62.81);Pain;Difficulty in walking, not elsewhere classified (R26.2) Pain - Right/Left: Left Pain - part of body: (Ribs)    Time: 1040-1105 PT Time Calculation (min) (ACUTE ONLY): 25 min   Charges:   PT Evaluation $PT Eval Moderate Complexity: 1 Mod PT Treatments $Gait Training: 8-22 mins   PT G Codes:        Reinaldo Berber, PT, DPT Acute Rehab Services Pager: [phone]    Reinaldo Berber 09/19/2017, 12:42 PM

## 2017-09-19 NOTE — Progress Notes (Signed)
Progress Note  Patient Name: Tommy Mclaughlin Date of Encounter: 09/19/2017  Primary Cardiologist: Terrence Dupont  Subjective   "Doc, I think I want to be DNR", c/o chest wall pain. Denies sob.  Inpatient Medications    Scheduled Meds: . aspirin EC  81 mg Oral Daily  . atorvastatin  80 mg Oral q1800  . carvedilol  6.25 mg Oral BID WC  . ferrous sulfate  325 mg Oral TID WC  . insulin aspart  0-15 Units Subcutaneous Q4H  . isosorbide-hydrALAZINE  0.5 tablet Oral BID  . mouth rinse  15 mL Mouth Rinse BID  . pantoprazole  40 mg Oral Q0600  . sodium chloride flush  3 mL Intravenous Q12H  . ticagrelor  90 mg Oral BID   Continuous Infusions: . sodium chloride 250 mL (09/18/17 0402)  . amiodarone 30 mg/hr (09/19/17 0700)  .  ceFAZolin (ANCEF) IV Stopped (09/19/17 0457)   PRN Meds: sodium chloride, acetaminophen, ALPRAZolam, bacitracin, nitroGLYCERIN, ondansetron (ZOFRAN) IV, polyethylene glycol, sodium chloride flush   Vital Signs    Vitals:   09/19/17 0500 09/19/17 0600 09/19/17 0700 09/19/17 0729  BP: (!) 117/56 (!) 105/55 126/63   Pulse: 75 76 78   Resp: 20 14 (!) 26   Temp:    98.4 F (36.9 C)  TempSrc:    Oral  SpO2: 92% 96%    Weight:      Height:        Intake/Output Summary (Last 24 hours) at 09/19/2017 0808 Last data filed at 09/19/2017 0700 Gross per 24 hour  Intake 717.19 ml  Output 1070 ml  Net -352.81 ml   Filed Weights   09/17/17 0100 09/18/17 0500 09/19/17 0427  Weight: 231 lb 11.3 oz (105.1 kg) 239 lb 3.2 oz (108.5 kg) 240 lb 1.3 oz (108.9 kg)    Telemetry    nsr with PVC's - Personally Reviewed  ECG    none - Personally Reviewed  Physical Exam   GEN: No acute distress.   Neck: 7 cm JVD Cardiac: RRR, no murmurs, rubs, or gallops.  Respiratory: Clear to auscultation bilaterally with minimal basilar rales. GI: Soft, nontender, non-distended  MS: 2+ peripheral edema; No deformity, pulses dopplerable Neuro:  Nonfocal  Psych: seems anxious  Labs      Chemistry Recent Labs  Lab 09/16/17 0239  09/17/17 0042  09/17/17 1804 09/18/17 0227 09/19/17 0215  NA 137   < > 138   < > 138 137  138 137  K 3.8   < > 4.0   < > 3.7 3.4*  3.4* 3.7  CL 103   < > 107   < > 104 102  103 102  CO2 22   < > 15*   < > 22 24  23 25   GLUCOSE 181*   < > 182*   < > 169* 180*  181* 130*  BUN 55*   < > 51*   < > 53* 52*  52* 50*  CREATININE 5.75*   < > 4.87*   < > 4.86* 4.60*  4.61* 4.18*  CALCIUM 7.6*   < > 7.0*   < > 7.6* 7.7*  7.7* 7.5*  PROT  --   --  5.5*  --   --   --   --   ALBUMIN 1.5*  --  1.7*  --   --  1.7*  --   AST  --   --  31  --   --   --   --  ALT  --   --  6  --   --   --   --   ALKPHOS  --   --  195*  --   --   --   --   BILITOT  --   --  0.7  --   --   --   --   GFRNONAA 10*   < > 12*   < > 13* 13*  13* 15*  GFRAA 12*   < > 14*   < > 15* 16*  15* 17*  ANIONGAP 12   < > 16*   < > 12 11  12 10    < > = values in this interval not displayed.     Hematology Recent Labs  Lab 09/17/17 1804 09/18/17 0227 09/19/17 0215  WBC 9.0 9.2 10.1  RBC 3.26* 3.37* 3.18*  HGB 8.6* 9.0* 8.4*  HCT 27.0* 27.8* 26.4*  MCV 82.8 82.5 83.0  MCH 26.4 26.7 26.4  MCHC 31.9 32.4 31.8  RDW 16.4* 16.5* 16.3*  PLT 391 422* 374    Cardiac Enzymes Recent Labs  Lab 09/17/17 0042 09/17/17 0153 09/17/17 0834 09/17/17 1405  TROPONINI 2.36* 1.97* 2.53* 2.62*   No results for input(s): TROPIPOC in the last 168 hours.   BNPNo results for input(s): BNP, PROBNP in the last 168 hours.   DDimer No results for input(s): DDIMER in the last 168 hours.   Radiology    No results found.  Cardiac Studies   none  Patient Profile     52 y.o. male admitted with STEMI, VT, renal failure  Assessment & Plan    1. VT - We have stopped the IV llidocaine. Ok to stop IV amio on 7/5. Start amio 200 bid. No additional VT. With his multiple comorbidities and relationship of VT to ischemia (NSTEMI), I would recommend he go home with a Life Vest. 2. CAD  - he is s/p multiple stents/PCI's.  3. Stage 4-5 renal failure - his creatinine is slowly improving 4. Chronic systolic heart failure - in bed he appears comfortable. No respiratory distress. Tolerating isosorbide/hydralazine.   For questions or updates, please contact [place] Please consult [email or web address] for contact info under Cardiology/STEMI.      Signed, Cristopher Peru, MD  09/19/2017, 8:08 AM  Patient ID: Tommy Mclaughlin, male   DOB: 1965/03/22, 52 y.o.   MRN: [id number]

## 2017-09-19 NOTE — Progress Notes (Signed)
Pt educated on potential SE of Fe infusion. Reports understanding. Call bell in reach.

## 2017-09-19 NOTE — Progress Notes (Signed)
Left foot with noted large bulla fluid filled just like the right foot to the arch area of his foot measuring approx. 5cm x 11cm with overlaying fluid filled skin intact.  There is also an area where blister has ruptured and skin is not intact to outer aspect of left heel measuring approx. 3cm x 5 cm.  Vaseline gauze applied to smaller area.  Foam intact to bottom of foot at arch area.  Foot wrapped with kerlex and heels elevated on both feet.

## 2017-09-19 NOTE — Progress Notes (Signed)
Subjective:  Complaints of soreness in the left side of the chest localized.. No anginal chest pain. Also complains of difficulty voiding had an in and out catheter 2 urine output improved. Creatinine trending down  Objective:  Vital Signs in the last 24 hours: Temp:  [97.5 F (36.4 C)-98.7 F (37.1 C)] 98.4 F (36.9 C) (07/04 0729) Pulse Rate:  [64-83] 79 (07/04 0900) Resp:  [10-39] 13 (07/04 0900) BP: (103-149)/(48-88) 107/58 (07/04 0900) SpO2:  [92 %-100 %] 98 % (07/04 0900) Weight:  [108.9 kg (240 lb 1.3 oz)] 108.9 kg (240 lb 1.3 oz) (07/04 0427)  Intake/Output from previous day: 07/03 0701 - 07/04 0700 In: 905.9 [P.O.:240; I.V.:565.9; IV Piggyback:100] Out: 1070 [Urine:1070] Intake/Output from this shift: Total I/O In: -  Out: 1350 [Urine:1350]  Physical Exam: Neck: no adenopathy, no carotid bruit, no JVD and supple, symmetrical, trachea midline Lungs: Decreased breath sound at bases Heart: regular rate and rhythm, S1, S2 normal and Soft systolic murmur noted Abdomen: soft, non-tender; bowel sounds normal; no masses,  no organomegaly Extremities: No clubbing cyanosis trace edema noted right more than left  Lab Results: Recent Labs    09/18/17 0227 09/19/17 0215  WBC 9.2 10.1  HGB 9.0* 8.4*  PLT 422* 374   Recent Labs    09/18/17 0227 09/19/17 0215  NA 137  138 137  K 3.4*  3.4* 3.7  CL 102  103 102  CO2 24  23 25   GLUCOSE 180*  181* 130*  BUN 52*  52* 50*  CREATININE 4.60*  4.61* 4.18*   Recent Labs    09/17/17 0834 09/17/17 1405  TROPONINI 2.53* 2.62*   Hepatic Function Panel Recent Labs    09/17/17 0042 09/18/17 0227  PROT 5.5*  --   ALBUMIN 1.7* 1.7*  AST 31  --   ALT 6  --   ALKPHOS 195*  --   BILITOT 0.7  --    No results for input(s): CHOL in the last 72 hours. No results for input(s): PROTIME in the last 72 hours.  Imaging: Imaging results have been reviewed and No results found.  Cardiac Studies:  Assessment/Plan:   Status post sustained V. Tach cardiac arrest Status post inferolateral wall myocardial infarction status post PCI to OM 2 Multivessel CAD with critical LAD stenosisKstatus post PTCA stenting to mid and distal LAD with excellent angiographic results and intra-aortic balloon pump insertion Ischemic cardiomyopathy Metabolic acidosis Hypertensive heart disease with systolic dysfunction Decompensated systolic congestive heart failure Insulin requiring diabetes mellitus History of right CVA with left paresis History of tobacco abuse Acute on chronic kidney injury stage IV secondary to contrast improving Anemia of chronic disease Peripheral vascular disease Positive family history of coronary artery disease Urinary retention Plan Start Flomax 0.4 mg daily Add Aldactone 12.5 mg daily Increase ambulation as tolerated Please arrange for LifeVest, Possible discharge in the next 24-48 hours if stable and renal function improving and no further V. tach    LOS: 8 days    Charolette Forward 09/19/2017, 10:48 AM

## 2017-09-19 NOTE — Progress Notes (Signed)
Pt reporting extreme SOB, pain in chest. Infusion stopped. VSS.

## 2017-09-19 NOTE — Progress Notes (Signed)
Pt symptoms resolved spontaneously. Re-started Fe at lower rate. Pt tolerated well, infusion completed.

## 2017-09-20 ENCOUNTER — Ambulatory Visit: Payer: Medicare Other | Admitting: Cardiovascular Disease

## 2017-09-20 LAB — GLUCOSE, CAPILLARY
GLUCOSE-CAPILLARY: 157 mg/dL — AB (ref 70–99)
GLUCOSE-CAPILLARY: 101 mg/dL — AB (ref 70–99)
GLUCOSE-CAPILLARY: 129 mg/dL — AB (ref 70–99)
GLUCOSE-CAPILLARY: 212 mg/dL — AB (ref 70–99)
Glucose-Capillary: 168 mg/dL — ABNORMAL HIGH (ref 70–99)
Glucose-Capillary: 199 mg/dL — ABNORMAL HIGH (ref 70–99)
Glucose-Capillary: 213 mg/dL — ABNORMAL HIGH (ref 70–99)

## 2017-09-20 LAB — CBC
HEMATOCRIT: 26.7 % — AB (ref 39.0–52.0)
Hemoglobin: 8.4 g/dL — ABNORMAL LOW (ref 13.0–17.0)
MCH: 26.6 pg (ref 26.0–34.0)
MCHC: 31.5 g/dL (ref 30.0–36.0)
MCV: 84.5 fL (ref 78.0–100.0)
Platelets: 381 10*3/uL (ref 150–400)
RBC: 3.16 MIL/uL — ABNORMAL LOW (ref 4.22–5.81)
RDW: 16.6 % — ABNORMAL HIGH (ref 11.5–15.5)
WBC: 8.8 10*3/uL (ref 4.0–10.5)

## 2017-09-20 LAB — BASIC METABOLIC PANEL
Anion gap: 10 (ref 5–15)
BUN: 46 mg/dL — AB (ref 6–20)
CHLORIDE: 107 mmol/L (ref 98–111)
CO2: 25 mmol/L (ref 22–32)
CREATININE: 3.76 mg/dL — AB (ref 0.61–1.24)
Calcium: 7.5 mg/dL — ABNORMAL LOW (ref 8.9–10.3)
GFR calc non Af Amer: 17 mL/min — ABNORMAL LOW (ref >60)
GFR, EST AFRICAN AMERICAN: 20 mL/min — AB (ref >60)
GLUCOSE: 123 mg/dL — AB (ref 70–99)
Potassium: 3.8 mmol/L (ref 3.5–5.1)
Sodium: 142 mmol/L (ref 135–145)

## 2017-09-20 MED ORDER — AMIODARONE HCL 200 MG PO TABS
200.0000 mg | ORAL_TABLET | Freq: Two times a day (BID) | ORAL | Status: DC
  Administered 2017-09-20 – 2017-09-21 (×4): 200 mg via ORAL
  Filled 2017-09-20 (×4): qty 1

## 2017-09-20 MED ORDER — ISOSORB DINITRATE-HYDRALAZINE 20-37.5 MG PO TABS
1.0000 | ORAL_TABLET | Freq: Two times a day (BID) | ORAL | Status: DC
  Administered 2017-09-20 – 2017-09-25 (×11): 1 via ORAL
  Filled 2017-09-20 (×11): qty 1

## 2017-09-20 MED ORDER — CARVEDILOL 12.5 MG PO TABS
12.5000 mg | ORAL_TABLET | Freq: Two times a day (BID) | ORAL | Status: DC
  Administered 2017-09-20 – 2017-09-21 (×4): 12.5 mg via ORAL
  Filled 2017-09-20 (×4): qty 1

## 2017-09-20 NOTE — Progress Notes (Signed)
Pt continued to have urinary voiding/retention issued.  Coladenato at bedside and ordered bladder scan.  Bladder scan completed showing >656ml in past 4 hrs.  Ordered to insert foley. 99fr foley inserted sterily after peri care completed.  Foley care post post foley insertion.  Inserted with assistance from K. Quick Therapist, sports. Cloudy, yellow sediment returned initially, changing to clear, yellow urine.  Foley secured with stat lock, with no dependant loops.pt educated.   Charge RN aware.  MD ordered to keep foley in place and not to remove until MD ordered to do so.

## 2017-09-20 NOTE — Progress Notes (Addendum)
CARDIAC REHAB PHASE I   PRE:  Rate/Rhythm: 82 SR    BP: sitting 132/74    SaO2: 97 RA  MODE:  Ambulation: 190 ft   POST:  Rate/Rhythm: 86 SR    BP: sitting 130/62     SaO2: 97 RA  Pt had just walked with OT on my arrival so we talked first. He is very vocal about his worry of being a burden and is questioning "whats the point" in living. I will call chaplain and he would benefit from palliative c/s. Encouraged him to look at the positives in his life and the opportunities to bless others. Discussed further caring for his heart. Reviewed MI/Brilinta, discussed walking daily, NTG, and CRPII for when he is done with [facility] services. Will refer to [place]. Gave pt ed videos to watch. Voiced understanding. Pt would benefit from RD c/s as well. [phone]   [place], ACSM 09/20/2017 11:26 AM

## 2017-09-20 NOTE — Progress Notes (Signed)
S: Pt continues to have difficulty with urinary retention/hesitancy and has had several I&O catheter insertions over the last 24 hours O:BP 136/74 (BP Location: Left Arm)   Pulse 85   Temp 98.7 F (37.1 C) (Oral)   Resp 20   Ht 6\' 2"  (1.88 m)   Wt 109.4 kg (241 lb 2.9 oz)   SpO2 97%   BMI 30.97 kg/m   Intake/Output Summary (Last 24 hours) at 09/20/2017 1142 Last data filed at 09/20/2017 0837 Gross per 24 hour  Intake 824.98 ml  Output 2600 ml  Net -1775.02 ml   Intake/Output: I/O last 3 completed shifts: In: 865.6 [I.V.:588.8; IV Piggyback:276.8] Out: 3950 [Urine:3950]  Intake/Output this shift:  Total I/O In: 267 [P.O.:240; I.V.:27] Out: -  Weight change: 0.5 kg (1 lb 1.6 oz) Gen:NAD CVS: no rub Resp: cta Abd: benign Ext:2+ edema  Recent Labs  Lab 09/15/17 0236 09/16/17 0239 09/16/17 1550 09/17/17 0042 09/17/17 0407 09/17/17 1804 09/18/17 0227 09/19/17 0215 09/20/17 0235  NA 134* 137 139 138 138 138 137  138 137 142  K 3.4* 3.8 4.2 4.0 4.1 3.7 3.4*  3.4* 3.7 3.8  CL 101 103 104 107 107 104 102  103 102 107  CO2 20* 22 19* 15* 21* 22 24  23 25 25   GLUCOSE 140* 181* 179* 182* 218* 169* 180*  181* 130* 123*  BUN 55* 55* 56* 51* 55* 53* 52*  52* 50* 46*  CREATININE 6.15* 5.75* 5.48* 4.87* 5.22* 4.86* 4.60*  4.61* 4.18* 3.76*  ALBUMIN 1.6* 1.5*  --  1.7*  --   --  1.7*  --   --   CALCIUM 7.4* 7.6* 7.8* 7.0* 7.1* 7.6* 7.7*  7.7* 7.5* 7.5*  PHOS 4.5 4.9*  --   --   --   --  4.1  --   --   AST  --   --   --  31  --   --   --   --   --   ALT  --   --   --  6  --   --   --   --   --    Liver Function Tests: Recent Labs  Lab 09/16/17 0239 09/17/17 0042 09/18/17 0227  AST  --  31  --   ALT  --  6  --   ALKPHOS  --  195*  --   BILITOT  --  0.7  --   PROT  --  5.5*  --   ALBUMIN 1.5* 1.7* 1.7*   No results for input(s): LIPASE, AMYLASE in the last 168 hours. No results for input(s): AMMONIA in the last 168 hours. CBC: Recent Labs  Lab 09/17/17 0407  09/17/17 1804 09/18/17 0227 09/19/17 0215 09/20/17 0235  WBC 12.2* 9.0 9.2 10.1 8.8  HGB 9.0* 8.6* 9.0* 8.4* 8.4*  HCT 28.3* 27.0* 27.8* 26.4* 26.7*  MCV 83.7 82.8 82.5 83.0 84.5  PLT 393 391 422* 374 381   Cardiac Enzymes: Recent Labs  Lab 09/14/17 0243 09/17/17 0042 09/17/17 0153 09/17/17 0834 09/17/17 1405  TROPONINI 5.28* 2.36* 1.97* 2.53* 2.62*   CBG: Recent Labs  Lab 09/19/17 1520 09/19/17 1958 09/20/17 0006 09/20/17 0425 09/20/17 0731  GLUCAP 157* 228* 168* 101* 129*    Iron Studies: No results for input(s): IRON, TIBC, TRANSFERRIN, FERRITIN in the last 72 hours. Studies/Results: No results found. Marland Kitchen amiodarone  200 mg Oral BID  . aspirin EC  81 mg Oral Daily  .  atorvastatin  80 mg Oral q1800  . carvedilol  12.5 mg Oral BID WC  . insulin aspart  0-15 Units Subcutaneous Q4H  . isosorbide-hydrALAZINE  1 tablet Oral BID  . mouth rinse  15 mL Mouth Rinse BID  . pantoprazole  40 mg Oral Q0600  . sodium chloride flush  3 mL Intravenous Q12H  . tamsulosin  0.4 mg Oral Daily  . ticagrelor  90 mg Oral BID    BMET    Component Value Date/Time   NA 142 09/20/2017 0235   NA 141 08/27/2017 1134   K 3.8 09/20/2017 0235   CL 107 09/20/2017 0235   CO2 25 09/20/2017 0235   GLUCOSE 123 (H) 09/20/2017 0235   BUN 46 (H) 09/20/2017 0235   BUN 32 (H) 08/27/2017 1134   CREATININE 3.76 (H) 09/20/2017 0235   CALCIUM 7.5 (L) 09/20/2017 0235   GFRNONAA 17 (L) 09/20/2017 0235   GFRAA 20 (L) 09/20/2017 0235   CBC    Component Value Date/Time   WBC 8.8 09/20/2017 0235   RBC 3.16 (L) 09/20/2017 0235   HGB 8.4 (L) 09/20/2017 0235   HCT 26.7 (L) 09/20/2017 0235   HCT 29.2 (L) 07/25/2017 0346   PLT 381 09/20/2017 0235   MCV 84.5 09/20/2017 0235   MCH 26.6 09/20/2017 0235   MCHC 31.5 09/20/2017 0235   RDW 16.6 (H) 09/20/2017 0235   LYMPHSABS 2.7 09/11/2017 1318   MONOABS 1.0 09/11/2017 1318   EOSABS 0.1 09/11/2017 1318   BASOSABS 0.0 09/11/2017 1318     Assessment/Plan: 1. Non-oliguricAKI/CKD stage 3 due to IV contrast with underlying diabetic nephropathy. Cr peaked at 6.15 but improving to4.85 but then increased again following code and is now down to 3.76. Thankfully his creat has continued to improve despite having another cath and contrast load. No indication for dialysis at this time.  1. He will need to f/u with our office due to his underlying CKD stage 3 related to DM and HTN s/p superimposed AKI. 2. Urinary retention/BOO- bladder scan remains >600 cc's 1. RN to place foley catheter now and will need to f/u with Urology 2. Started on Flomax. 3. Keep foley in place for now and would attempt bladder retraining when he is closer to being discharged 3. Multi-vessel CAD s/p acute Inferolateral MI s/p DES to OM2. Also with LAD lesions 80 and 85%, prox and mid respectively s/p PCI's 09/17/17 with good results. IABP has been discontinued.Feels better and is without his normal angina. 4. S/p sustained VT cardiac arrest- s/p PCI's and awaiting LifeVest, started po amio per cardiology 5. Anemia of CKD stage 3- iron deficiency and will plan on giving IV Feraheme and follow.  Will also consider ESA 6. DM- per primary 7. HTN- stable 8. CHF- diuresed well, follow I's/O's and daily creatinine.   Donetta Potts, MD [employer] [phone]

## 2017-09-20 NOTE — Evaluation (Signed)
Occupational Therapy Evaluation Patient Details Name: Tommy Mclaughlin MRN: [id number] DOB: [birth year] Today's Date: 09/20/2017    History of Present Illness 52 y.o. male admitted 6/26 with chest pain SOB and leg pain, code STEMI was called and had emergency PCI. Subsequently had VT cardiac arrest requiring CPR x 2 mins and defibrillation on 7/2. PMH includes: TD2DM, CVA with L sided residual weakness, CHF, HTN,    Clinical Impression   Pt admitted with above. He demonstrates the below listed deficits and will benefit from continued OT to maximize safety and independence with BADLs.  Pt presents to OT with generalized weakness, decreased activity tolerance, impaired balance and Lt sided chest pain due to CPR.  He currently requires min guard - min A for ADLs, and at times requires min A for sit to stand.  He requires cues for walker safety and for pacing himself.  He lives with his cousin who works long hours, and is often away from home for 24-48 hours.   Do not feel he is safe to be at home for that length of time, and recommend Short term SNF for rehab.  If that is not an option, recommend maxing out [place], PT, RN, aide.    Pt is also discussing desire to change his code status to DNR as he is unsure he would want to be resuscitated if he were to code again.  He is voicing concern about being a burden to his family, and not having adequate supports if his level of disability were to increase - recommend palliative consult.       Follow Up Recommendations  SNF;Supervision/Assistance - 24 hour    Equipment Recommendations  Tub/shower bench;3 in 1 bedside commode    Recommendations for Other Services       Precautions / Restrictions Precautions Precautions: Fall Restrictions Weight Bearing Restrictions: No      Mobility Bed Mobility               General bed mobility comments: sitting in recliner   Transfers Overall transfer level: Needs assistance Equipment  used: Rolling walker (2 wheeled) Transfers: Sit to/from [facility] Sit to Stand: Min assist Stand pivot transfers: Min guard       General transfer comment: min A to move into standing from low surface.  He demonstrates difficulty pushing up from chair due to Lt sided chest pain     Balance Overall balance assessment: Needs assistance Sitting-balance support: Feet supported Sitting balance-Leahy Scale: Good     Standing balance support: Single extremity supported;During functional activity Standing balance-Leahy Scale: Fair Standing balance comment: able to maintain static standing without UE support with close min guard assist.                            ADL either performed or assessed with clinical judgement   ADL Overall ADL's : Needs assistance/impaired Eating/Feeding: Independent   Grooming: Wash/dry hands;Wash/dry face;Oral care;Brushing hair;Min guard;Standing   Upper Body Bathing: Set up;Sitting Upper Body Bathing Details (indicate cue type and reason): assist to wash back  Lower Body Bathing: Minimal assistance;Sit to/from stand Lower Body Bathing Details (indicate cue type and reason): assist for feet  Upper Body Dressing : Set up;Sitting   Lower Body Dressing: Sit to/from stand;Minimal assistance Lower Body Dressing Details (indicate cue type and reason): assist to don socks.  Dressings in place bil. feet  Toilet Transfer: Min guard;Ambulation;Comfort height toilet;RW;Grab bars Toilet  Transfer Details (indicate cue type and reason): reliant on elevated surfaces due to his height  Toileting- Water quality scientist and Hygiene: Min guard;Sit to/from stand       Functional mobility during ADLs: Min guard;Minimal assistance;Rolling walker       Vision         Perception     Praxis      Pertinent Vitals/Pain Pain Assessment: Faces Pain Location: L side ribs Pain Descriptors / Indicators: Discomfort Pain Intervention(s):  Limited activity within patient's tolerance;Monitored during session     Hand Dominance Left   Extremity/Trunk Assessment Upper Extremity Assessment Upper Extremity Assessment: LUE deficits/detail LUE Deficits / Details: Pt with residual weakness Lt UE from previous CVA    Lower Extremity Assessment Lower Extremity Assessment: Defer to PT evaluation   Cervical / Trunk Assessment Cervical / Trunk Assessment: Other exceptions(flexed posture )   Communication Communication Communication: No difficulties   Cognition Arousal/Alertness: Awake/alert Behavior During Therapy: WFL for tasks assessed/performed Overall Cognitive Status: Within Functional Limits for tasks assessed                                 General Comments: Pt requires cues for walker safety and pacing himself    General Comments  HR in the 70s DOE 2/4.  Requires 4 rest breaks.  Discussed need for pacing self during ADLs and with ambulation     Exercises     Shoulder Instructions      Home Living Family/patient expects to be discharged to:: Private residence Living Arrangements: Other relatives Available Help at Discharge: Family;Available PRN/intermittently Type of Home: House Home Access: Stairs to enter [employer] of Steps: 4 Entrance Stairs-Rails: None Home Layout: One level     Bathroom Shower/Tub: Teacher, early years/pre: Standard     Home Equipment: Environmental consultant - 2 wheels;Cane - single point;Walker - 4 wheels   Additional Comments: Pt moved from [place] to [place] and lives with cousin who works long hours and "sometimes doesn't come home for a day or two".  They do not have a  working stove in the home, and eat out often.   He reports they are moving to a apartment in [place] at the end of the month        Prior Functioning/Environment Level of Independence: Independent with assistive device(s)        Comments: uses cane most frequently but sometimes RW when he feels  weaker than normal L side        OT Problem List: Decreased strength;Decreased activity tolerance;Impaired balance (sitting and/or standing);Decreased safety awareness;Cardiopulmonary status limiting activity      OT Treatment/Interventions: Self-care/ADL training;Therapeutic exercise;Energy conservation;DME and/or AE instruction;Therapeutic activities;Patient/family education;Balance training    OT Goals(Current goals can be found in the care plan section) Acute Rehab OT Goals Patient Stated Goal: "regain my independence"  OT Goal Formulation: With patient Time For Goal Achievement: 10/04/17 Potential to Achieve Goals: Good ADL Goals Pt Will Perform Grooming: with modified independence;standing Pt Will Perform Upper Body Bathing: with modified independence;sitting;standing Pt Will Perform Lower Body Bathing: with modified independence;sit to/from stand Pt Will Perform Upper Body Dressing: with modified independence;sitting Pt Will Perform Lower Body Dressing: with modified independence;sit to/from stand Pt Will Transfer to Toilet: with modified independence;ambulating;regular height toilet;bedside commode;grab bars Pt Will Perform Toileting - Clothing Manipulation and hygiene: with modified independence;sit to/from stand Pt Will Perform Tub/Shower Transfer: Tub transfer;with supervision;ambulating;tub  bench;rolling walker  OT Frequency: Min 2X/week   Barriers to D/C: Decreased caregiver support  cousin is often gone for long stretches of time        Co-evaluation              AM-PAC PT "6 Clicks" Daily Activity     Outcome Measure Help from another person eating meals?: None Help from another person taking care of personal grooming?: A Little Help from another person toileting, which includes using toliet, bedpan, or urinal?: A Little Help from another person bathing (including washing, rinsing, drying)?: A Little Help from another person to put on and taking off regular  upper body clothing?: A Little Help from another person to put on and taking off regular lower body clothing?: A Little 6 Click Score: 19   End of Session Equipment Utilized During Treatment: Rolling walker Nurse Communication: Mobility status  Activity Tolerance: Patient tolerated treatment well Patient left: in chair;with call bell/phone within reach  OT Visit Diagnosis: Unsteadiness on feet (R26.81)                Time: 7121-9758 OT Time Calculation (min): 39 min Charges:  OT General Charges $OT Visit: 1 Visit OT Evaluation $OT Eval Moderate Complexity: 1 Mod OT Treatments $Self Care/Home Management : 23-37 mins G-Codes:     [facility], OTR/L [phone]   Tinley Rought M 09/20/2017, 10:11 AM

## 2017-09-20 NOTE — Progress Notes (Signed)
Telemetry reviewed.  No VT, infrequent PVCs only Patient was changed to PO amiodarone today already as recommended yesterday w/Dr. Lovena Le For life vest prior to discharge, will defer to Dr. Cory Roughen cardiology.  (done)  EP service will sign off, though remain available.  Please recall if needed.  Tommye Standard, PA-C

## 2017-09-20 NOTE — Progress Notes (Signed)
Physical Therapy Treatment Patient Details Name: Tommy Mclaughlin MRN: [id number] DOB: 1965-11-20 Today's Date: 09/20/2017    History of Present Illness 52 y.o. male admitted 6/26 with chest pain SOB and leg pain, code STEMI was called and had emergency PCI. Subsequently had VT cardiac arrest requiring CPR x 2 mins and defibrillation on 7/2. PMH includes: TD2DM, CVA with L sided residual weakness, CHF, HTN,     PT Comments    Patient progressing slowly with therapy this visit. Requiring min A to help transfer into standing with increased risk of falling due to weakness and L sided pain. Per OT, pt with safety concerns with ADLs, and upon further discussion with patient today have learned that his cousin he lives with will be gone for extended periods of time leaving patient alone. At this point do not feel pt is ready to be unsupervised with all mobility and ADLs for longer than short periods of time due to deconditioning. Updating recs to reflect ST-SNF before safe return home. Pt agreeable upon discussion today.  VSS on RA with x3 rest breaks during gait training.     Follow Up Recommendations  SNF     Equipment Recommendations  (TBD next venue)    Recommendations for Other Services OT consult     Precautions / Restrictions Precautions Precautions: Fall Restrictions Weight Bearing Restrictions: No    Mobility  Bed Mobility               General bed mobility comments: sitting in recliner   Transfers Overall transfer level: Needs assistance Equipment used: Rolling walker (2 wheeled) Transfers: Sit to/from [employer] Transfers Sit to Stand: Min assist         General transfer comment: min A to move into standing from low surface.  He demonstrates difficulty pushing up from chair due to Lt sided chest pain   Ambulation/Gait Ambulation/Gait assistance: Min guard Gait Distance (Feet): 180 Feet Assistive device: Rolling walker (2 wheeled) Gait  Pattern/deviations: Step-to pattern;Step-through pattern Gait velocity: decreased   General Gait Details: ambulating with RW, on RA, VSS, pt with x3 rest breaks due to fatigue, reports he is feeling weaker than his baseline HRmax 95   Stairs             Wheelchair Mobility    Modified Rankin (Stroke Patients Only)       Balance Overall balance assessment: Needs assistance Sitting-balance support: Feet supported Sitting balance-Leahy Scale: Good     Standing balance support: Single extremity supported;During functional activity Standing balance-Leahy Scale: Fair Standing balance comment: able to maintain static standing without UE support with close min guard assist.                             Cognition Arousal/Alertness: Awake/alert Behavior During Therapy: WFL for tasks assessed/performed Overall Cognitive Status: Within Functional Limits for tasks assessed                                 General Comments: Pt requires cues for walker safety and pacing himself       Exercises      General Comments        Pertinent Vitals/Pain Pain Assessment: Faces Pain Location: L side ribs Pain Descriptors / Indicators: Discomfort Pain Intervention(s): Limited activity within patient's tolerance;Monitored during session;Premedicated before session    Home Living  Prior Function            PT Goals (current goals can now be found in the care plan section) Acute Rehab PT Goals Patient Stated Goal: "regain my independence"  PT Goal Formulation: With patient Time For Goal Achievement: 09/26/17 Potential to Achieve Goals: Good Progress towards PT goals: Progressing toward goals    Frequency    Min 3X/week      PT Plan Discharge plan needs to be updated    Co-evaluation              AM-PAC PT "6 Clicks" Daily Activity  Outcome Measure  Difficulty turning over in bed (including adjusting  bedclothes, sheets and blankets)?: A Lot Difficulty moving from lying on back to sitting on the side of the bed? : A Lot Difficulty sitting down on and standing up from a chair with arms (e.g., wheelchair, bedside commode, etc,.)?: A Lot Help needed moving to and from a bed to chair (including a wheelchair)?: A Little Help needed walking in hospital room?: A Little Help needed climbing 3-5 steps with a railing? : A Lot 6 Click Score: 14    End of Session Equipment Utilized During Treatment: Gait belt Activity Tolerance: Patient tolerated treatment well Patient left: in chair;with call bell/phone within reach;with family/visitor present Nurse Communication: Mobility status PT Visit Diagnosis: Unsteadiness on feet (R26.81);Muscle weakness (generalized) (M62.81);Pain;Difficulty in walking, not elsewhere classified (R26.2) Pain - Right/Left: Left     Time: 1337-1400 PT Time Calculation (min) (ACUTE ONLY): 23 min  Charges:  $Gait Training: 8-22 mins                    G Codes:       Reinaldo Berber, PT, DPT Acute Rehab Services Pager: [phone]      Reinaldo Berber 09/20/2017, 2:33 PM

## 2017-09-20 NOTE — Care Management Note (Signed)
Case Management Note Marvetta Gibbons RN,BSN Unit [facility] 1-22 Case Manager  [phone]  Patient Details  Name: Tommy Mclaughlin MRN: [id number] Date of Birth: 1965/07/15  Subjective/Objective:  Pt admitted with Vtach cardiac arrest, MI, MVD s/p stenting                   Action/Plan: PTA Pt lived at home with cousin, PCP- Dr. Lynnae January with IM clinic here at [facility]. Pt states he gets his meds filled at [facility]. Per benefits check for Brilinta- copay $3.80. Call received from Clair Gulling with [facility] regarding Life Vest- order form that was sent in was incomplete will need new order form faxed- have spoken with Dr Terrence Dupont regarding matter- and he has faxed new order to CM- f/u on new order was faxed to [place] for Life Vest approval- they have received other needed paperwork this am.  Pt has moved to [phone]- spoke with pt at bedside regarding transition of care needs- pt provided 30 day free card to use with Brilinta- explained he can use card at any local pharmacy- as [place] in not opened on the weekends. Pt also provided into brochure on lifevest. PT eval now recommending STSNF- discussed this with pt who is agreeable- call made to CSW for STSNF placement needs- pt will be ready for discharge once life vest approved and fitted at the bedside- possibly 09/21/17- have spoken with Clair Gulling at [facility]- only known partnership with SNF in this area that works with life vest is [facility]- [place] [address] [place].  Will have CSW follow up with this for placement- ?[place] and maybe [place] [facility].   Expected Discharge Date:  09/21/17               Expected Discharge Plan:  Skilled Nursing Facility(Pt is from home)  In-House Referral:  Clinical Social Work  Discharge planning Services  CM Consult  Post Acute Care Choice:  Home Health, Durable Medical Equipment Choice offered to:  Patient  DME Arranged:  Life vest DME Agency:  [facility]  HH Arranged:    [place] Agency:     Status of Service:  In process, will continue to  follow  If discussed at Long Length of Stay Meetings, dates discussed:    Discharge Disposition: skilled facility   Additional Comments:  Dawayne Patricia, RN 09/20/2017, 3:09 PM

## 2017-09-20 NOTE — Progress Notes (Signed)
Report called and received to 6E.  Will transfer pt once he is done with his lunch tray.

## 2017-09-20 NOTE — Progress Notes (Signed)
Subjective:  Patient denies any chest pain or shortness of breath, still having difficulty voiding.  Renal function gradually improving.  Objective:  Vital Signs in the last 24 hours: Temp:  [97.6 F (36.4 C)-99 F (37.2 C)] 98.7 F (37.1 C) (07/05 0729) Pulse Rate:  [76-85] 85 (07/05 0700) Resp:  [13-29] 13 (07/05 0800) BP: (107-166)/(58-94) 162/93 (07/05 0800) SpO2:  [93 %-100 %] 97 % (07/05 0800) Weight:  [109.4 kg (241 lb 2.9 oz)] 109.4 kg (241 lb 2.9 oz) (07/05 0600)  Intake/Output from previous day: 07/04 0701 - 07/05 0700 In: 622.5 [I.V.:395.7; IV Piggyback:226.8] Out: 3950 [Urine:3950] Intake/Output from this shift: Total I/O In: 16.7 [I.V.:16.7] Out: -   Physical Exam: Neck: no adenopathy, no carotid bruit, no JVD and supple, symmetrical, trachea midline Lungs: clear to auscultation bilaterally Heart: regular rate and rhythm, S1, S2 normal and soft systolic murmur noted Abdomen: soft, non-tender; bowel sounds normal; no masses,  no organomegaly Extremities: no clubbing, cyanosis.  Trace edema noted  Lab Results: Recent Labs    09/19/17 0215 09/20/17 0235  WBC 10.1 8.8  HGB 8.4* 8.4*  PLT 374 381   Recent Labs    09/19/17 0215 09/20/17 0235  NA 137 142  K 3.7 3.8  CL 102 107  CO2 25 25  GLUCOSE 130* 123*  BUN 50* 46*  CREATININE 4.18* 3.76*   Recent Labs    09/17/17 1405  TROPONINI 2.62*   Hepatic Function Panel Recent Labs    09/18/17 0227  ALBUMIN 1.7*   No results for input(s): CHOL in the last 72 hours. No results for input(s): PROTIME in the last 72 hours.  Imaging: Imaging results have been reviewed and No results found.  Cardiac Studies:  Assessment/Plan:  Status post sustained V. Tach cardiac arrest Status post inferolateral wall myocardial infarction status post PCI to OM 2 Multivessel CAD with critical LAD stenosisKstatus post PTCA stenting to mid and distal LAD with excellent angiographic results and intra-aortic balloon  pump insertion Ischemic cardiomyopathy Metabolic acidosis Hypertensive heart disease with systolic dysfunction Decompensated systolic congestive heart failure Insulin requiring diabetes mellitus History of right CVA with left paresis History of tobacco abuse Acute on chronic kidney injury stage IV secondary to contrast improving Anemia of chronic disease Peripheral vascular disease Positive family history of coronary artery disease Plan Continue present management. Increase carvedilol and BiDil as per orders. Change IV amiodarone to by mouth Patient discussed at length regarding his diet, lifestyle changes, compliance with medication. Awaiting for LifeVest   LOS: 9 days    Charolette Forward 09/20/2017, 8:53 AM

## 2017-09-21 LAB — CBC
HCT: 26.4 % — ABNORMAL LOW (ref 39.0–52.0)
Hemoglobin: 8.4 g/dL — ABNORMAL LOW (ref 13.0–17.0)
MCH: 26.4 pg (ref 26.0–34.0)
MCHC: 31.8 g/dL (ref 30.0–36.0)
MCV: 83 fL (ref 78.0–100.0)
Platelets: 394 10*3/uL (ref 150–400)
RBC: 3.18 MIL/uL — ABNORMAL LOW (ref 4.22–5.81)
RDW: 16.6 % — AB (ref 11.5–15.5)
WBC: 17.2 10*3/uL — ABNORMAL HIGH (ref 4.0–10.5)

## 2017-09-21 LAB — GLUCOSE, CAPILLARY
GLUCOSE-CAPILLARY: 136 mg/dL — AB (ref 70–99)
GLUCOSE-CAPILLARY: 147 mg/dL — AB (ref 70–99)
Glucose-Capillary: 119 mg/dL — ABNORMAL HIGH (ref 70–99)
Glucose-Capillary: 120 mg/dL — ABNORMAL HIGH (ref 70–99)
Glucose-Capillary: 133 mg/dL — ABNORMAL HIGH (ref 70–99)
Glucose-Capillary: 206 mg/dL — ABNORMAL HIGH (ref 70–99)
Glucose-Capillary: 219 mg/dL — ABNORMAL HIGH (ref 70–99)

## 2017-09-21 LAB — URINALYSIS, ROUTINE W REFLEX MICROSCOPIC
Bacteria, UA: NONE SEEN
Bilirubin Urine: NEGATIVE
GLUCOSE, UA: 50 mg/dL — AB
KETONES UR: NEGATIVE mg/dL
Nitrite: NEGATIVE
Specific Gravity, Urine: 1.02 (ref 1.005–1.030)
pH: 5 (ref 5.0–8.0)

## 2017-09-21 LAB — RENAL FUNCTION PANEL
ALBUMIN: 1.6 g/dL — AB (ref 3.5–5.0)
Anion gap: 12 (ref 5–15)
BUN: 36 mg/dL — AB (ref 6–20)
CO2: 24 mmol/L (ref 22–32)
Calcium: 8 mg/dL — ABNORMAL LOW (ref 8.9–10.3)
Chloride: 105 mmol/L (ref 98–111)
Creatinine, Ser: 2.85 mg/dL — ABNORMAL HIGH (ref 0.61–1.24)
GFR calc Af Amer: 28 mL/min — ABNORMAL LOW (ref >60)
GFR calc non Af Amer: 24 mL/min — ABNORMAL LOW (ref >60)
GLUCOSE: 140 mg/dL — AB (ref 70–99)
PHOSPHORUS: 2.5 mg/dL (ref 2.5–4.6)
POTASSIUM: 3.6 mmol/L (ref 3.5–5.1)
Sodium: 141 mmol/L (ref 135–145)

## 2017-09-21 MED ORDER — FUROSEMIDE 40 MG PO TABS
40.0000 mg | ORAL_TABLET | Freq: Every day | ORAL | Status: DC
  Administered 2017-09-21 – 2017-09-25 (×5): 40 mg via ORAL
  Filled 2017-09-21 (×5): qty 1

## 2017-09-21 MED ORDER — OXYCODONE-ACETAMINOPHEN 5-325 MG PO TABS
1.0000 | ORAL_TABLET | Freq: Four times a day (QID) | ORAL | Status: DC | PRN
  Administered 2017-09-21 – 2017-09-25 (×11): 1 via ORAL
  Filled 2017-09-21 (×11): qty 1

## 2017-09-21 MED ORDER — LIDOCAINE 5 % EX PTCH
1.0000 | MEDICATED_PATCH | CUTANEOUS | Status: DC
  Administered 2017-09-21 – 2017-09-25 (×4): 1 via TRANSDERMAL
  Filled 2017-09-21 (×5): qty 1

## 2017-09-21 MED ORDER — SODIUM CHLORIDE 0.9 % IV SOLN
1.0000 g | INTRAVENOUS | Status: DC
  Administered 2017-09-21 – 2017-09-25 (×5): 1 g via INTRAVENOUS
  Filled 2017-09-21 (×5): qty 10

## 2017-09-21 MED ORDER — SODIUM CHLORIDE 0.9 % IV SOLN
INTRAVENOUS | Status: DC
  Administered 2017-09-21 (×2): via INTRAVENOUS

## 2017-09-21 MED ORDER — POTASSIUM CHLORIDE CRYS ER 10 MEQ PO TBCR
10.0000 meq | EXTENDED_RELEASE_TABLET | Freq: Every day | ORAL | Status: DC
  Administered 2017-09-21 – 2017-09-25 (×5): 10 meq via ORAL
  Filled 2017-09-21 (×5): qty 1

## 2017-09-21 MED ORDER — AMIODARONE HCL 200 MG PO TABS
200.0000 mg | ORAL_TABLET | Freq: Every day | ORAL | Status: DC
  Administered 2017-09-22 – 2017-09-25 (×4): 200 mg via ORAL
  Filled 2017-09-21 (×4): qty 1

## 2017-09-21 MED FILL — Medication: Qty: 1 | Status: AC

## 2017-09-21 NOTE — Progress Notes (Signed)
Pt has had ongoing c/o left lateral chest wall pain, Dr Terrence Dupont aware, orders for lidocain patch and oxycodone and heat pad received, pt has reports some improvement.  Vearl Qualia RN

## 2017-09-21 NOTE — Progress Notes (Signed)
S: Feels better O:BP 136/80 (BP Location: Left Arm)   Pulse 91   Temp 98 F (36.7 C) (Oral)   Resp 15   Ht 6\' 2"  (1.88 m)   Wt 112.9 kg (248 lb 14.4 oz)   SpO2 92%   BMI 31.96 kg/m   Intake/Output Summary (Last 24 hours) at 09/21/2017 1002 Last data filed at 09/21/2017 0600 Gross per 24 hour  Intake 470.5 ml  Output 1975 ml  Net -1504.5 ml   Intake/Output: I/O last 3 completed shifts: In: 986.9 [P.O.:600; I.V.:236.9; IV Piggyback:150] Out: 4193 [Urine:3775]  Intake/Output this shift:  No intake/output data recorded. Weight change: 3.5 kg (7 lb 11.5 oz) Gen: NAD  CVS: no rub Resp: cta Abd: benign Ext: 2+ edema of lower ext  Recent Labs  Lab 09/15/17 0236 09/16/17 0239  09/17/17 0042 09/17/17 0407 09/17/17 1804 09/18/17 0227 09/19/17 0215 09/20/17 0235 09/21/17 0402  NA 134* 137   < > 138 138 138 137  138 137 142 141  K 3.4* 3.8   < > 4.0 4.1 3.7 3.4*  3.4* 3.7 3.8 3.6  CL 101 103   < > 107 107 104 102  103 102 107 105  CO2 20* 22   < > 15* 21* 22 24  23 25 25 24   GLUCOSE 140* 181*   < > 182* 218* 169* 180*  181* 130* 123* 140*  BUN 55* 55*   < > 51* 55* 53* 52*  52* 50* 46* 36*  CREATININE 6.15* 5.75*   < > 4.87* 5.22* 4.86* 4.60*  4.61* 4.18* 3.76* 2.85*  ALBUMIN 1.6* 1.5*  --  1.7*  --   --  1.7*  --   --  1.6*  CALCIUM 7.4* 7.6*   < > 7.0* 7.1* 7.6* 7.7*  7.7* 7.5* 7.5* 8.0*  PHOS 4.5 4.9*  --   --   --   --  4.1  --   --  2.5  AST  --   --   --  31  --   --   --   --   --   --   ALT  --   --   --  6  --   --   --   --   --   --    < > = values in this interval not displayed.   Liver Function Tests: Recent Labs  Lab 09/17/17 0042 09/18/17 0227 09/21/17 0402  AST 31  --   --   ALT 6  --   --   ALKPHOS 195*  --   --   BILITOT 0.7  --   --   PROT 5.5*  --   --   ALBUMIN 1.7* 1.7* 1.6*   No results for input(s): LIPASE, AMYLASE in the last 168 hours. No results for input(s): AMMONIA in the last 168 hours. CBC: Recent Labs  Lab 09/17/17 1804  09/18/17 0227 09/19/17 0215 09/20/17 0235 09/21/17 0402  WBC 9.0 9.2 10.1 8.8 17.2*  HGB 8.6* 9.0* 8.4* 8.4* 8.4*  HCT 27.0* 27.8* 26.4* 26.7* 26.4*  MCV 82.8 82.5 83.0 84.5 83.0  PLT 391 422* 374 381 394   Cardiac Enzymes: Recent Labs  Lab 09/17/17 0042 09/17/17 0153 09/17/17 0834 09/17/17 1405  TROPONINI 2.36* 1.97* 2.53* 2.62*   CBG: Recent Labs  Lab 09/20/17 1618 09/20/17 2002 09/21/17 0004 09/21/17 0400 09/21/17 0757  GLUCAP 199* 213* 119* 133* 136*    Iron Studies: No  results for input(s): IRON, TIBC, TRANSFERRIN, FERRITIN in the last 72 hours. Studies/Results: No results found. Marland Kitchen amiodarone  200 mg Oral BID  . aspirin EC  81 mg Oral Daily  . atorvastatin  80 mg Oral q1800  . carvedilol  12.5 mg Oral BID WC  . furosemide  40 mg Oral Daily  . insulin aspart  0-15 Units Subcutaneous Q4H  . isosorbide-hydrALAZINE  1 tablet Oral BID  . lidocaine  1 patch Transdermal Q24H  . mouth rinse  15 mL Mouth Rinse BID  . pantoprazole  40 mg Oral Q0600  . sodium chloride flush  3 mL Intravenous Q12H  . tamsulosin  0.4 mg Oral Daily  . ticagrelor  90 mg Oral BID    BMET    Component Value Date/Time   NA 141 09/21/2017 0402   NA 141 08/27/2017 1134   K 3.6 09/21/2017 0402   CL 105 09/21/2017 0402   CO2 24 09/21/2017 0402   GLUCOSE 140 (H) 09/21/2017 0402   BUN 36 (H) 09/21/2017 0402   BUN 32 (H) 08/27/2017 1134   CREATININE 2.85 (H) 09/21/2017 0402   CALCIUM 8.0 (L) 09/21/2017 0402   GFRNONAA 24 (L) 09/21/2017 0402   GFRAA 28 (L) 09/21/2017 0402   CBC    Component Value Date/Time   WBC 17.2 (H) 09/21/2017 0402   RBC 3.18 (L) 09/21/2017 0402   HGB 8.4 (L) 09/21/2017 0402   HCT 26.4 (L) 09/21/2017 0402   HCT 29.2 (L) 07/25/2017 0346   PLT 394 09/21/2017 0402   MCV 83.0 09/21/2017 0402   MCH 26.4 09/21/2017 0402   MCHC 31.8 09/21/2017 0402   RDW 16.6 (H) 09/21/2017 0402   LYMPHSABS 2.7 09/11/2017 1318   MONOABS 1.0 09/11/2017 1318   EOSABS 0.1  09/11/2017 1318   BASOSABS 0.0 09/11/2017 1318    Assessment/Plan: 1. Non-oliguricAKI/CKD stage 3 due to IV contrast with underlying diabetic nephropathy. Cr peaked at 6.15 but improving to4.85 but then increased again following code and is now down to 2.85. Thankfully his creat has continued to improve despite having another cath and contrast load. No indication for dialysis at this time. 1. He will need to f/u with our office due to his underlying CKD stage 3 related to DM and HTN s/p superimposed AKI. 2. F/u in 2 -3 weeks after discharge 2. Urinary retention/BOO- bladder scan remains >600 cc's 1. RN to place foley catheter now and will need to f/u with Urology 2. Started on Flomax. 3. Keep foley in place for now and would attempt bladder retraining when he is closer to being discharged 3. Multi-vessel CAD s/p acute Inferolateral MI s/p DES to OM2. Also with LAD lesions 80 and 85%, prox and mid respectively s/p PCI's 09/17/17 with good results. IABP has been discontinued.Feels better and is without his normal angina. 4. S/p sustained VT cardiac arrest- s/p PCI's and awaiting LifeVest, started po amio per cardiology 5. Anemia of CKD stage 3- irondeficiency and will plan on giving IV Feraheme and follow. Will also considerESA 6. DM- per primary 7. HTN- stable 8. CHF- diuresed well, but still with significant edema.  Will restart lasix 40mg  po daily and follow I's/O's and daily creatinine. 9. Disposition- not sure what he wants longterm as he may go back to [place], [place] as his cousin is concerned about him being left alone.  For now awaiting LifeVest and discharge to [facility] in [facility].     Donetta Potts, MD [facility] [phone]

## 2017-09-21 NOTE — Progress Notes (Signed)
Subjective:  Complaints of left-sided chest soreness localized increased with movement.  Also noted to have worsening leg swelling.  Creatinine trending down.  White count is elevated.  This a.m. No fever or chills.  Patient had multiple episodes of in and out catheter and now has Foley catheter in.  Urine appears slightly cloudy.  Patient states he did not had problems voiding prior to coming to hospital.  Objective:  Vital Signs in the last 24 hours: Temp:  [98 F (36.7 C)-98.8 F (37.1 C)] 98 F (36.7 C) (07/06 0758) Pulse Rate:  [86-92] 91 (07/06 0758) Resp:  [11-20] 15 (07/06 0401) BP: (136-163)/(58-92) 136/80 (07/06 0758) SpO2:  [92 %-100 %] 92 % (07/06 0401) Weight:  [112.9 kg (248 lb 14.4 oz)] 112.9 kg (248 lb 14.4 oz) (07/06 0601)  Intake/Output from previous day: 07/05 0701 - 07/06 0700 In: 737.5 [P.O.:600; I.V.:37.5; IV Piggyback:100] Out: 1975 [WKGSU:1103] Intake/Output from this shift: No intake/output data recorded.  Physical Exam: Neck: no adenopathy, no carotid bruit, no JVD and supple, symmetrical, trachea midline Lungs: decreased breath sounds at bases Heart: regular rate and rhythm, S1, S2 normal and soft systolic murmur noted Abdomen: soft, non-tender; bowel sounds normal; no masses,  no organomegaly Extremities: no clubbing, cyanosis, 1+ edema noted  Lab Results: Recent Labs    09/20/17 0235 09/21/17 0402  WBC 8.8 17.2*  HGB 8.4* 8.4*  PLT 381 394   Recent Labs    09/20/17 0235 09/21/17 0402  NA 142 141  K 3.8 3.6  CL 107 105  CO2 25 24  GLUCOSE 123* 140*  BUN 46* 36*  CREATININE 3.76* 2.85*   No results for input(s): TROPONINI in the last 72 hours.  Invalid input(s): CK, MB Hepatic Function Panel Recent Labs    09/21/17 0402  ALBUMIN 1.6*   No results for input(s): CHOL in the last 72 hours. No results for input(s): PROTIME in the last 72 hours.  Imaging: Imaging results have been reviewed and No results found.  Cardiac  Studies:  Assessment/Plan:   Status post sustained V. Tach cardiac arrest Status post inferolateral wall myocardial infarction status post PCI to OM 2 Multivessel CAD with critical LAD stenosisKstatus post PTCA stenting to mid and distal LAD with excellent angiographic results and intra-aortic balloon pump insertion Ischemic cardiomyopathy Metabolic acidosis Hypertensive heart disease with systolic dysfunction Decompensated systolic congestive heart failure Insulin requiring diabetes mellitus History of right CVA with left paresis History of tobacco abuse Acute on chronic kidney injury stage IV secondary to contrast improving Anemia of chronic disease Peripheral vascular disease Positive family history of coronary artery disease Marked leukocytosis, rule out UTI. Plan Check UA and urine cultures. Start Rocephin empirically. Start Lasix as per orders. Discussed at length with patient regarding compliance with diet and restricting fluids to 1 L per 24 hours. Will leave Foley in for next 24 hours and then do the trial of voiding.  Continue Flomax   LOS: 10 days    Charolette Forward 09/21/2017, 10:02 AM

## 2017-09-21 NOTE — Progress Notes (Signed)
CARDIAC REHAB PHASE I   PRE:  Rate/Rhythm: 91 SR  BP:  Sitting: 132/73      SaO2: 98 RA  MODE:  Ambulation: 200 ft 96 peak HR  POST:  Rate/Rhythm: 93 SR  BP:  Sitting: 116/57    SaO2: 99 RA   Pt ambulated 243ft in hallway assist of one with gait belt and front wheel rolling walker. Pt c/o L sided rib pain, RN made aware. Pt sitting on side of bed, call bell and phone within reach. No questions on previous education. RN at bedside. Will continue to follow.  [phone] Rufina Falco, RN BSN 09/21/2017 9:32 AM

## 2017-09-22 DIAGNOSIS — Z515 Encounter for palliative care: Secondary | ICD-10-CM

## 2017-09-22 LAB — URINE CULTURE

## 2017-09-22 LAB — GLUCOSE, CAPILLARY
GLUCOSE-CAPILLARY: 134 mg/dL — AB (ref 70–99)
Glucose-Capillary: 129 mg/dL — ABNORMAL HIGH (ref 70–99)
Glucose-Capillary: 132 mg/dL — ABNORMAL HIGH (ref 70–99)
Glucose-Capillary: 159 mg/dL — ABNORMAL HIGH (ref 70–99)
Glucose-Capillary: 201 mg/dL — ABNORMAL HIGH (ref 70–99)

## 2017-09-22 LAB — RENAL FUNCTION PANEL
ALBUMIN: 1.7 g/dL — AB (ref 3.5–5.0)
ANION GAP: 8 (ref 5–15)
BUN: 37 mg/dL — AB (ref 6–20)
CALCIUM: 8 mg/dL — AB (ref 8.9–10.3)
CO2: 26 mmol/L (ref 22–32)
CREATININE: 2.81 mg/dL — AB (ref 0.61–1.24)
Chloride: 106 mmol/L (ref 98–111)
GFR calc Af Amer: 28 mL/min — ABNORMAL LOW (ref >60)
GFR calc non Af Amer: 24 mL/min — ABNORMAL LOW (ref >60)
GLUCOSE: 137 mg/dL — AB (ref 70–99)
PHOSPHORUS: 3.1 mg/dL (ref 2.5–4.6)
Potassium: 3.7 mmol/L (ref 3.5–5.1)
SODIUM: 140 mmol/L (ref 135–145)

## 2017-09-22 LAB — BASIC METABOLIC PANEL
ANION GAP: 9 (ref 5–15)
BUN: 38 mg/dL — AB (ref 6–20)
CALCIUM: 8.1 mg/dL — AB (ref 8.9–10.3)
CO2: 25 mmol/L (ref 22–32)
CREATININE: 2.84 mg/dL — AB (ref 0.61–1.24)
Chloride: 106 mmol/L (ref 98–111)
GFR calc Af Amer: 28 mL/min — ABNORMAL LOW (ref >60)
GFR, EST NON AFRICAN AMERICAN: 24 mL/min — AB (ref >60)
GLUCOSE: 139 mg/dL — AB (ref 70–99)
Potassium: 3.6 mmol/L (ref 3.5–5.1)
Sodium: 140 mmol/L (ref 135–145)

## 2017-09-22 LAB — CBC
HCT: 25.2 % — ABNORMAL LOW (ref 39.0–52.0)
HEMOGLOBIN: 7.8 g/dL — AB (ref 13.0–17.0)
MCH: 26.1 pg (ref 26.0–34.0)
MCHC: 31 g/dL (ref 30.0–36.0)
MCV: 84.3 fL (ref 78.0–100.0)
Platelets: 366 10*3/uL (ref 150–400)
RBC: 2.99 MIL/uL — ABNORMAL LOW (ref 4.22–5.81)
RDW: 17 % — ABNORMAL HIGH (ref 11.5–15.5)
WBC: 15.5 10*3/uL — ABNORMAL HIGH (ref 4.0–10.5)

## 2017-09-22 MED ORDER — DARBEPOETIN ALFA 40 MCG/0.4ML IJ SOSY
40.0000 ug | PREFILLED_SYRINGE | Freq: Once | INTRAMUSCULAR | Status: AC
  Administered 2017-09-22: 40 ug via SUBCUTANEOUS
  Filled 2017-09-22: qty 0.4

## 2017-09-22 MED ORDER — CARVEDILOL 6.25 MG PO TABS
6.2500 mg | ORAL_TABLET | Freq: Two times a day (BID) | ORAL | Status: DC
  Administered 2017-09-22 – 2017-09-25 (×6): 6.25 mg via ORAL
  Filled 2017-09-22 (×6): qty 1

## 2017-09-22 MED ORDER — DIGOXIN 125 MCG PO TABS
0.0625 mg | ORAL_TABLET | Freq: Every day | ORAL | Status: DC
Start: 2017-09-22 — End: 2017-09-25
  Administered 2017-09-22 – 2017-09-25 (×4): 0.0625 mg via ORAL
  Filled 2017-09-22 (×4): qty 1

## 2017-09-22 MED ORDER — FUROSEMIDE 10 MG/ML IJ SOLN
40.0000 mg | Freq: Once | INTRAMUSCULAR | Status: AC
  Administered 2017-09-22: 40 mg via INTRAVENOUS
  Filled 2017-09-22: qty 4

## 2017-09-22 NOTE — Progress Notes (Signed)
Subjective:  Patient denies any anginal chest pain or shortness of breath. Complains of leg swelling sleeps both feet dangling down  the bed all night. States has been drinking plenty of fluids despite instructions to restrict fluid.  Objective:  Vital Signs in the last 24 hours: Temp:  [97.8 F (36.6 C)-98.6 F (37 C)] 97.8 F (36.6 C) (07/07 0805) Pulse Rate:  [78-84] 81 (07/07 0805) Resp:  [12-20] 20 (07/07 0805) BP: (98-145)/(57-82) 127/77 (07/07 0805) SpO2:  [95 %-100 %] 99 % (07/07 0805) Weight:  [111.4 kg (245 lb 9.6 oz)] 111.4 kg (245 lb 9.6 oz) (07/07 0407)  Intake/Output from previous day: 07/06 0701 - 07/07 0700 In: 580 [P.O.:480; IV Piggyback:100] Out: 1525 [Urine:1525] Intake/Output from this shift: No intake/output data recorded.  Physical Exam: Neck: no adenopathy, no carotid bruit, no JVD and supple, symmetrical, trachea midline Lungs: Decreased breath sound at bases Heart: regular rate and rhythm, S1, S2 normal and Soft systolic murmur noted Abdomen: soft, non-tender; bowel sounds normal; no masses,  no organomegaly Extremities: No clubbing cyanosis 2+ edema noted  Lab Results: Recent Labs    09/21/17 0402 09/22/17 0606  WBC 17.2* 15.5*  HGB 8.4* 7.8*  PLT 394 366   Recent Labs    09/21/17 0402 09/22/17 0606  NA 141 140  140  K 3.6 3.7  3.6  CL 105 106  106  CO2 24 26  25   GLUCOSE 140* 137*  139*  BUN 36* 37*  38*  CREATININE 2.85* 2.81*  2.84*   No results for input(s): TROPONINI in the last 72 hours.  Invalid input(s): CK, MB Hepatic Function Panel Recent Labs    09/22/17 0606  ALBUMIN 1.7*   No results for input(s): CHOL in the last 72 hours. No results for input(s): PROTIME in the last 72 hours.  Imaging: Imaging results have been reviewed and No results found.  Cardiac Studies:  Assessment/Plan:   Status post sustained V. Tach cardiac arrest Status post inferolateral wall myocardial infarction status post PCI to OM  2 Multivessel CAD with critical LAD stenosisKstatus post PTCA stenting to mid and distal LAD with excellent angiographic results and intra-aortic balloon pump insertion Ischemic cardiomyopathy Metabolic acidosis Hypertensive heart disease with systolic dysfunction Decompensated systolic congestive heart failure Insulin requiring diabetes mellitus History of right CVA with left paresis History of tobacco abuse Acute on chronic kidney injury stage IV secondary to contrast improving Anemia of chronic disease Peripheral vascular disease Positive family history of coronary artery disease Marked leukocytosis, rule out UTI. Plan Reduce carvedilol as per orders Lasix 40 mg IV 1, continue by mouth Lasix Start low-dose digoxin as per orders Check labs in a.m.   LOS: 11 days    Charolette Forward 09/22/2017, 8:32 AM

## 2017-09-22 NOTE — Consult Note (Signed)
Consultation Note Date: 09/22/2017   Patient Name: Tommy Mclaughlin  DOB: [birth year]  MRN: [id number]  Age / Sex: 52 y.o., male  PCP: Katherine Roan, MD Referring Physician: Charolette Forward, MD  Reason for Consultation: Establishing goals of care  HPI/Patient Profile: 52 y.o. male  with past medical history of HTN, ICM, IDDM, h/o CVA with L. Sided hemiparesis, PVD, CKD III, and chronic anemia, who was admitted on 09/11/2017 with STEMI and is s/p PCI with stenting x 2 to OM2. His hospitalization was complicated by VT arrest on 09/16/17 requiring CPR and defibrillation with ROSC in ~ 2 minutes. He was taken back to the cath lab and had PCI to the LAD and required insertion of an IABP. He is now awaiting LifeVest and discharge to rehab in [facility]. Palliative care was asked to discuss code status with patient.   Clinical Assessment and Goals of Care: I met with patient to discuss goals. Patient was able to relate to me the medical events that have transpired during this hospitalization. He seems cognizant of his health issues and future impact that these might have. He tells me that he watched his mother go through a similar trajectory prior to her passing. We discussed code status at length. He has contemplated a DNR, as was his mother's decision prior to her passing. However, he has not yet had time to talk with his daughter or brother, both of whom are involved and he would want their input. Patient does not have a HCPOA but is interested in establishing this prior to leaving the hospital. He would want his brother and then daughter to be decision makers. He says his daughter struggled with the passing of her grandmother and he fears that she would not be well suited to handling end of life care.   Patient is from [place]. He moved here in April 2019 to live with his cousin. However, he is considering forgoing  rehab and moving back with his brother in [place] where he feels he would have better support. Would SW/family be able to locate a rehab center in [place], which might facilitate patient's goals?  SUMMARY OF RECOMMENDATIONS   Full code Chaplain to assist with establishing HCPOA SW to facilitate disposition     Primary Diagnoses: Present on Admission: . Acute MI, inferolateral wall ([place]) . Acute on chronic systolic and diastolic heart failure, NYHA class 3 ([place]) . Acute renal failure superimposed on stage 4 chronic kidney disease ([place])   I have reviewed the medical record, interviewed the patient and family, and examined the patient. The following aspects are pertinent.  Past Medical History:  Diagnosis Date  . CHF (congestive heart failure) ([place])   . CVA (cerebral vascular accident) ([place])   . Heart failure ([place])   . Hypertension   . Status post CVA   . T2DM (type 2 diabetes mellitus) ([place])    Social History   Socioeconomic History  . Marital status: Widowed    Spouse name: Not on file  . Number  of children: Not on file  . Years of education: Not on file  . Highest education level: Not on file  Occupational History  . Occupation: Materials engineer    Comment: On disability s/p CVA  Social Needs  . Financial resource strain: Not hard at all  . Food insecurity:    Worry: Never true    Inability: Never true  . Transportation needs:    Medical: No    Non-medical: No  Tobacco Use  . Smoking status: Former Smoker    Packs/day: 0.50    Types: Cigarettes    Last attempt to quit: 06/24/2016    Years since quitting: 1.2  . Smokeless tobacco: Never Used  . Tobacco comment: wearing patches  Substance and Sexual Activity  . Alcohol use: Not Currently    Comment: stopped after CVA 2017  . Drug use: Not Currently  . Sexual activity: Not on file  Lifestyle  . Physical activity:    Days per week: 2 days    Minutes per session: Not on file  . Stress: To some extent  Relationships  .  Social connections:    Talks on phone: More than three times a week    Gets together: More than three times a week    Attends religious service: Not on file    Active member of club or organization: No    Attends meetings of clubs or organizations: Never    Relationship status: Widowed  Other Topics Concern  . Not on file  Social History Narrative   Mr. Tse moved to [facility] 07/14/17 from [place] where he resided while taking care of his mother. He currently lives with his cousin, who is a Administrator. He has one adult daughter (age 70).   Family History  Problem Relation Age of Onset  . COPD Mother   . Hypertension Mother   . Diabetes Mother   . Diabetes Sister   . Diabetes Brother    Scheduled Meds: . amiodarone  200 mg Oral Daily  . aspirin EC  81 mg Oral Daily  . atorvastatin  80 mg Oral q1800  . carvedilol  6.25 mg Oral BID WC  . digoxin  0.0625 mg Oral Daily  . furosemide  40 mg Oral Daily  . insulin aspart  0-15 Units Subcutaneous Q4H  . isosorbide-hydrALAZINE  1 tablet Oral BID  . lidocaine  1 patch Transdermal Q24H  . mouth rinse  15 mL Mouth Rinse BID  . pantoprazole  40 mg Oral Q0600  . potassium chloride  10 mEq Oral Daily  . sodium chloride flush  3 mL Intravenous Q12H  . tamsulosin  0.4 mg Oral Daily  . ticagrelor  90 mg Oral BID   Continuous Infusions: . sodium chloride 10 mL/hr at 09/21/17 0428  . cefTRIAXone (ROCEPHIN)  IV 1 g (09/21/17 1156)  . ferumoxytol Stopped (09/19/17 1419)   PRN Meds:.acetaminophen, ALPRAZolam, bacitracin, nitroGLYCERIN, ondansetron (ZOFRAN) IV, oxyCODONE-acetaminophen, polyethylene glycol, sodium chloride flush Medications Prior to Admission:  Prior to Admission medications   Medication Sig Start Date End Date Taking? Authorizing Provider  atorvastatin (LIPITOR) 40 MG tablet Take 1 tablet (40 mg total) by mouth daily at 6 PM. 07/25/17  Yes Lorella Nimrod, MD  bacitracin ointment Apply topically as needed for wound care.  07/25/17  Yes Lorella Nimrod, MD  carvedilol (COREG) 25 MG tablet Take 1 tablet (25 mg total) by mouth 2 (two) times daily with a meal. 08/27/17  Yes Maryellen Pile, MD  Dulaglutide (TRULICITY) 3.47 QQ/5.9DG SOPN Inject 0.75 mg into the skin once a week. 07/26/17  Yes Lorella Nimrod, MD  famotidine (PEPCID) 10 MG tablet Take 1 tablet (10 mg total) by mouth daily. 07/26/17  Yes Lorella Nimrod, MD  furosemide (LASIX) 40 MG tablet Take 1 tablet (40 mg total) by mouth daily. 08/21/17  Yes Maryellen Pile, MD  losartan (COZAAR) 50 MG tablet Take 1 tablet (50 mg total) by mouth daily. 07/30/17 07/30/18 Yes Kathi Ludwig, MD  nicotine (NICODERM CQ - DOSED IN MG/24 HOURS) 14 mg/24hr patch Place 1 patch (14 mg total) onto the skin daily. 07/26/17  Yes Lorella Nimrod, MD  traMADol (ULTRAM) 50 MG tablet Take 1 tablet (50 mg total) by mouth every 12 (twelve) hours as needed for moderate pain or severe pain. 08/21/17  Yes Maryellen Pile, MD   Allergies  Allergen Reactions  . Tomato Rash   Review of Systems  Constitutional: Positive for activity change.  Respiratory: Negative for shortness of breath.   Cardiovascular: Negative for chest pain.  Gastrointestinal: Negative for constipation.    Physical Exam  Constitutional: He is oriented to person, place, and time. He appears well-developed.  NAD  Cardiovascular: Normal rate and regular rhythm.  Musculoskeletal:  B. Feet wrapped  Neurological: He is alert and oriented to person, place, and time.  Skin: Skin is warm.    Vital Signs: BP 128/69 (BP Location: Left Arm)   Pulse 81   Temp 97.8 F (36.6 C) (Oral)   Resp 20   Ht '6\' 2"'  (1.88 m)   Wt 111.4 kg (245 lb 9.6 oz)   SpO2 100%   BMI 31.53 kg/m  Pain Scale: 0-10 POSS *See Group Information*: 1-Acceptable,Awake and alert Pain Score: 6    SpO2: SpO2: 100 % O2 Device:SpO2: 100 % O2 Flow Rate: .O2 Flow Rate (L/min): (2)  IO: Intake/output summary:   Intake/Output Summary (Last 24 hours) at  09/22/2017 1129 Last data filed at 09/22/2017 0500 Gross per 24 hour  Intake 340 ml  Output 1225 ml  Net -885 ml    LBM: Last BM Date: 09/20/17 Baseline Weight: Weight: 98.9 kg (218 lb 0.6 oz) Most recent weight: Weight: 111.4 kg (245 lb 9.6 oz)     Palliative Assessment/Data:   Flowsheet Rows     Most Recent Value  Intake Tab  Referral Department  Cardiology  Unit at Time of Referral  Med/Surg Unit  Date Notified  09/20/17  Palliative Care Type  New Palliative care  Reason Not Seen  Consult cancelled  Reason for referral  Clarify Goals of Care  Date of Admission  09/11/17  Date first seen by Palliative Care  09/22/17  # of days Palliative referral response time  2 Day(s)  # of days IP prior to Palliative referral  9  Clinical Assessment  Psychosocial & Spiritual Assessment  Palliative Care Outcomes      Time In: 1100 Time Out: 1200 Time Total: 60 minutes Greater than 50%  of this time was spent counseling and coordinating care related to the above assessment and plan.  Signed by: Irean Hong, NP   Please contact Palliative Medicine Team phone at [phone] for questions and concerns.  For individual provider: See Shea Evans

## 2017-09-22 NOTE — Progress Notes (Signed)
S: Feels better today O:BP 128/69 (BP Location: Left Arm)   Pulse 81   Temp 97.8 F (36.6 C) (Oral)   Resp 20   Ht 6\' 2"  (1.88 m)   Wt 111.4 kg (245 lb 9.6 oz)   SpO2 100%   BMI 31.53 kg/m   Intake/Output Summary (Last 24 hours) at 09/22/2017 1259 Last data filed at 09/22/2017 0500 Gross per 24 hour  Intake 340 ml  Output 1225 ml  Net -885 ml   Intake/Output: I/O last 3 completed shifts: In: 810.5 [P.O.:600; I.V.:10.5; IV Piggyback:200] Out: 2800 [Urine:2800]  Intake/Output this shift:  No intake/output data recorded. Weight change: -1.497 kg (-3 lb 4.8 oz) Gen: NAD CVS: no rub Resp: cta Abd: benign Ext: 1+ edema  Recent Labs  Lab 09/16/17 0239  09/17/17 0042 09/17/17 0407 09/17/17 1804 09/18/17 0227 09/19/17 0215 09/20/17 0235 09/21/17 0402 09/22/17 0606  NA 137   < > 138 138 138 137  138 137 142 141 140  140  K 3.8   < > 4.0 4.1 3.7 3.4*  3.4* 3.7 3.8 3.6 3.7  3.6  CL 103   < > 107 107 104 102  103 102 107 105 106  106  CO2 22   < > 15* 21* 22 24  23 25 25 24 26  25   GLUCOSE 181*   < > 182* 218* 169* 180*  181* 130* 123* 140* 137*  139*  BUN 55*   < > 51* 55* 53* 52*  52* 50* 46* 36* 37*  38*  CREATININE 5.75*   < > 4.87* 5.22* 4.86* 4.60*  4.61* 4.18* 3.76* 2.85* 2.81*  2.84*  ALBUMIN 1.5*  --  1.7*  --   --  1.7*  --   --  1.6* 1.7*  CALCIUM 7.6*   < > 7.0* 7.1* 7.6* 7.7*  7.7* 7.5* 7.5* 8.0* 8.0*  8.1*  PHOS 4.9*  --   --   --   --  4.1  --   --  2.5 3.1  AST  --   --  31  --   --   --   --   --   --   --   ALT  --   --  6  --   --   --   --   --   --   --    < > = values in this interval not displayed.   Liver Function Tests: Recent Labs  Lab 09/17/17 0042 09/18/17 0227 09/21/17 0402 09/22/17 0606  AST 31  --   --   --   ALT 6  --   --   --   ALKPHOS 195*  --   --   --   BILITOT 0.7  --   --   --   PROT 5.5*  --   --   --   ALBUMIN 1.7* 1.7* 1.6* 1.7*   No results for input(s): LIPASE, AMYLASE in the last 168 hours. No results  for input(s): AMMONIA in the last 168 hours. CBC: Recent Labs  Lab 09/18/17 0227 09/19/17 0215 09/20/17 0235 09/21/17 0402 09/22/17 0606  WBC 9.2 10.1 8.8 17.2* 15.5*  HGB 9.0* 8.4* 8.4* 8.4* 7.8*  HCT 27.8* 26.4* 26.7* 26.4* 25.2*  MCV 82.5 83.0 84.5 83.0 84.3  PLT 422* 374 381 394 366   Cardiac Enzymes: Recent Labs  Lab 09/17/17 0042 09/17/17 0153 09/17/17 0834 09/17/17 1405  TROPONINI 2.36* 1.97* 2.53*  2.62*   CBG: Recent Labs  Lab 09/21/17 2050 09/21/17 2355 09/22/17 0405 09/22/17 0801 09/22/17 1124  GLUCAP 219* 120* 129* 132* 159*    Iron Studies: No results for input(s): IRON, TIBC, TRANSFERRIN, FERRITIN in the last 72 hours. Studies/Results: No results found. Marland Kitchen amiodarone  200 mg Oral Daily  . aspirin EC  81 mg Oral Daily  . atorvastatin  80 mg Oral q1800  . carvedilol  6.25 mg Oral BID WC  . digoxin  0.0625 mg Oral Daily  . furosemide  40 mg Oral Daily  . insulin aspart  0-15 Units Subcutaneous Q4H  . isosorbide-hydrALAZINE  1 tablet Oral BID  . lidocaine  1 patch Transdermal Q24H  . mouth rinse  15 mL Mouth Rinse BID  . pantoprazole  40 mg Oral Q0600  . potassium chloride  10 mEq Oral Daily  . sodium chloride flush  3 mL Intravenous Q12H  . tamsulosin  0.4 mg Oral Daily  . ticagrelor  90 mg Oral BID    BMET    Component Value Date/Time   NA 140 09/22/2017 0606   NA 140 09/22/2017 0606   NA 141 08/27/2017 1134   K 3.7 09/22/2017 0606   K 3.6 09/22/2017 0606   CL 106 09/22/2017 0606   CL 106 09/22/2017 0606   CO2 26 09/22/2017 0606   CO2 25 09/22/2017 0606   GLUCOSE 137 (H) 09/22/2017 0606   GLUCOSE 139 (H) 09/22/2017 0606   BUN 37 (H) 09/22/2017 0606   BUN 38 (H) 09/22/2017 0606   BUN 32 (H) 08/27/2017 1134   CREATININE 2.81 (H) 09/22/2017 0606   CREATININE 2.84 (H) 09/22/2017 0606   CALCIUM 8.0 (L) 09/22/2017 0606   CALCIUM 8.1 (L) 09/22/2017 0606   GFRNONAA 24 (L) 09/22/2017 0606   GFRNONAA 24 (L) 09/22/2017 0606   GFRAA 28 (L)  09/22/2017 0606   GFRAA 28 (L) 09/22/2017 0606   CBC    Component Value Date/Time   WBC 15.5 (H) 09/22/2017 0606   RBC 2.99 (L) 09/22/2017 0606   HGB 7.8 (L) 09/22/2017 0606   HCT 25.2 (L) 09/22/2017 0606   HCT 29.2 (L) 07/25/2017 0346   PLT 366 09/22/2017 0606   MCV 84.3 09/22/2017 0606   MCH 26.1 09/22/2017 0606   MCHC 31.0 09/22/2017 0606   RDW 17.0 (H) 09/22/2017 0606   LYMPHSABS 2.7 09/11/2017 1318   MONOABS 1.0 09/11/2017 1318   EOSABS 0.1 09/11/2017 1318   BASOSABS 0.0 09/11/2017 1318     Assessment/Plan: 1. Non-oliguricAKI/CKD stage 3 due to IV contrast with underlying diabetic nephropathy. Cr peaked at 6.15 but improving to4.85 but then increased again following code and is now down to 2.85. Thankfully his creat has continued to improve despite having another cath and contrast load. No indication for dialysis at this time. 1. He will need to f/u with our office due to his underlying CKD stage 3 related to DM and HTN s/p superimposed AKI once he is stable for discharge 2. F/u in 2 -3 weeks after discharge 2. Urinary retention/BOO-bladder scan remains >600 cc's 1. RN placed foley catheterand will need to f/u with Urology 2. Started on Flomax. 3. Keep foley in place for now and would attempt bladder retraining when he is closer to being discharged 3. Multi-vessel CAD s/p acute Inferolateral MI s/p DES to OM2. Also with LAD lesions 80 and 85%, prox and mid respectively s/p PCI's 09/17/17 with good results. IABP has been discontinued.Feels better and is  without his normal angina. 4. S/p sustained VT cardiac arrest- s/p PCI's and awaiting LifeVest, started po amio per cardiology 5. Anemia of CKD stage 3- irondeficiency and will plan on giving IV Feraheme and follow. Will also startESA 6. DM- per primary 7. HTN- stable 8. CHF- diuresed well, but still with significant edema.  Will restart lasix 40mg  po daily and follow I's/O's and daily creatinine. 9. Disposition-  not sure what he wants longterm as he may go back to [place], [place] as his cousin is concerned about him being left alone.  For now awaiting LifeVest and [facility].    Donetta Potts, MD [employer] [phone]

## 2017-09-23 LAB — GLUCOSE, CAPILLARY
GLUCOSE-CAPILLARY: 122 mg/dL — AB (ref 70–99)
GLUCOSE-CAPILLARY: 129 mg/dL — AB (ref 70–99)
Glucose-Capillary: 104 mg/dL — ABNORMAL HIGH (ref 70–99)
Glucose-Capillary: 122 mg/dL — ABNORMAL HIGH (ref 70–99)
Glucose-Capillary: 170 mg/dL — ABNORMAL HIGH (ref 70–99)
Glucose-Capillary: 197 mg/dL — ABNORMAL HIGH (ref 70–99)
Glucose-Capillary: 209 mg/dL — ABNORMAL HIGH (ref 70–99)

## 2017-09-23 LAB — RENAL FUNCTION PANEL
ALBUMIN: 1.9 g/dL — AB (ref 3.5–5.0)
Anion gap: 11 (ref 5–15)
BUN: 32 mg/dL — AB (ref 6–20)
CALCIUM: 8.3 mg/dL — AB (ref 8.9–10.3)
CO2: 24 mmol/L (ref 22–32)
CREATININE: 2.67 mg/dL — AB (ref 0.61–1.24)
Chloride: 105 mmol/L (ref 98–111)
GFR calc Af Amer: 30 mL/min — ABNORMAL LOW (ref >60)
GFR, EST NON AFRICAN AMERICAN: 26 mL/min — AB (ref >60)
Glucose, Bld: 132 mg/dL — ABNORMAL HIGH (ref 70–99)
PHOSPHORUS: 3.3 mg/dL (ref 2.5–4.6)
Potassium: 3.8 mmol/L (ref 3.5–5.1)
Sodium: 140 mmol/L (ref 135–145)

## 2017-09-23 LAB — CBC
HCT: 28.3 % — ABNORMAL LOW (ref 39.0–52.0)
Hemoglobin: 8.8 g/dL — ABNORMAL LOW (ref 13.0–17.0)
MCH: 26.1 pg (ref 26.0–34.0)
MCHC: 31.1 g/dL (ref 30.0–36.0)
MCV: 84 fL (ref 78.0–100.0)
PLATELETS: 434 10*3/uL — AB (ref 150–400)
RBC: 3.37 MIL/uL — ABNORMAL LOW (ref 4.22–5.81)
RDW: 17.1 % — AB (ref 11.5–15.5)
WBC: 12 10*3/uL — AB (ref 4.0–10.5)

## 2017-09-23 MED ORDER — GUAIFENESIN-DM 100-10 MG/5ML PO SYRP
5.0000 mL | ORAL_SOLUTION | ORAL | Status: DC | PRN
  Administered 2017-09-23 – 2017-09-24 (×2): 5 mL via ORAL
  Filled 2017-09-23 (×2): qty 5

## 2017-09-23 NOTE — Progress Notes (Signed)
CARDIAC REHAB PHASE I   Offered to walk with pt x4. Pt working on d/c plan, not "ready". Pt hopeful for d/c tomorrow. Will continue to follow.  [id number] Rufina Falco, RN BSN 09/23/2017 1:37 PM

## 2017-09-23 NOTE — NC FL2 (Signed)
[place] LEVEL OF CARE SCREENING TOOL     IDENTIFICATION  Patient Name: Tommy Mclaughlin Birthdate: 03/12/1966 Sex: male Admission Date (Current Location): 09/11/2017  [facility] and [place] Number:  Herbalist and Address:  [facility] [place] [address] [place], [place] [address]      Provider Number: [id number]  Attending Physician Name and Address:  Charolette Forward, MD  Relative Name and Phone Number:  Vinal Rosengrant, brother, [phone]    Current Level of Care: Hospital Recommended Level of Care: [place] Prior Approval Number:    Date Approved/Denied:   PASRR Number: [id number]  Discharge Plan: SNF    Current Diagnoses: Patient Active Problem List   Diagnosis Date Noted  . Palliative care encounter   . Acute hypoxemic respiratory failure ([place]) 09/17/2017  . Pulseless ventricular tachycardia ([place]) 09/17/2017  . Acute on chronic systolic and diastolic heart failure, NYHA class 3 ([place]) 09/17/2017  . Acute renal failure superimposed on stage 4 chronic kidney disease ([place]) 09/17/2017  . High anion gap metabolic acidosis 00/17/4944  . Acute MI, inferolateral wall ([place]) 09/11/2017  . Osteoarthritis 08/15/2017  . AKI (acute kidney injury) ([place]) 07/31/2017  . Iron deficiency anemia 07/31/2017  . HFrEF (heart failure with reduced ejection fraction) ([place]) 07/24/2017  . T2DM (type 2 diabetes mellitus) ([place])   . Status post CVA   . Hypertension     Orientation RESPIRATION BLADDER Height & Weight     Self, Time, Situation, Place  Normal Continent, Indwelling catheter Weight: 242 lb (109.8 kg) Height:  6\' 2"  (188 cm)  BEHAVIORAL SYMPTOMS/MOOD NEUROLOGICAL BOWEL NUTRITION STATUS      Continent Diet(please see DC summary)  AMBULATORY STATUS COMMUNICATION OF NEEDS Skin   Limited Assist Verbally Other (Comment)(open wound R toe; incision R groin)                       Personal Care Assistance Level of  Assistance  Bathing, Feeding, Dressing Bathing Assistance: Limited assistance Feeding assistance: Independent Dressing Assistance: Limited assistance     Functional Limitations Info  Sight, Hearing, Speech Sight Info: Adequate Hearing Info: Adequate Speech Info: Adequate    SPECIAL CARE FACTORS FREQUENCY  OT (By licensed OT), PT (By licensed PT)     PT Frequency: 5x/week OT Frequency: 5x/week            Contractures Contractures Info: Not present    Additional Factors Info  Code Status, Allergies, Insulin Sliding Scale Code Status Info: Full Allergies Info: tomato   Insulin Sliding Scale Info: insulin Q4 hrs       Current Medications (09/23/2017):  This is the current hospital active medication list Current Facility-Administered Medications  Medication Dose Route Frequency Provider Last Rate Last Dose  . 0.9 %  sodium chloride infusion   Intravenous Continuous Charolette Forward, MD 10 mL/hr at 09/21/17 0428    . acetaminophen (TYLENOL) tablet 650 mg  650 mg Oral Q4H PRN Charolette Forward, MD   650 mg at 09/21/17 0845  . ALPRAZolam Duanne Moron) tablet 0.25 mg  0.25 mg Oral Q8H PRN Charolette Forward, MD   0.25 mg at 09/23/17 0007  . amiodarone (PACERONE) tablet 200 mg  200 mg Oral Daily Charolette Forward, MD   200 mg at 09/22/17 0954  . aspirin EC tablet 81 mg  81 mg Oral Daily Charolette Forward, MD   81 mg at 09/22/17 0954  . atorvastatin (LIPITOR) tablet 80 mg  80  mg Oral q1800 Charolette Forward, MD   80 mg at 09/22/17 1721  . bacitracin ointment   Topical PRN Charolette Forward, MD      . carvedilol (COREG) tablet 6.25 mg  6.25 mg Oral BID WC Charolette Forward, MD   6.25 mg at 09/22/17 1722  . cefTRIAXone (ROCEPHIN) 1 g in sodium chloride 0.9 % 100 mL IVPB  1 g Intravenous Q24H Charolette Forward, MD 200 mL/hr at 09/22/17 1217 1 g at 09/22/17 1217  . digoxin (LANOXIN) tablet 0.0625 mg  0.0625 mg Oral Daily Charolette Forward, MD   0.0625 mg at 09/22/17 0954  . ferumoxytol (FERAHEME) 510 mg in sodium chloride  0.9 % 100 mL IVPB  510 mg Intravenous Weekly Donato Heinz, MD   Stopped at 09/19/17 1419  . furosemide (LASIX) tablet 40 mg  40 mg Oral Daily Donato Heinz, MD   40 mg at 09/22/17 0954  . guaiFENesin-dextromethorphan (ROBITUSSIN DM) 100-10 MG/5ML syrup 5 mL  5 mL Oral Q4H PRN Charolette Forward, MD      . insulin aspart (novoLOG) injection 0-15 Units  0-15 Units Subcutaneous Q4H Charolette Forward, MD   2 Units at 09/23/17 0455  . isosorbide-hydrALAZINE (BIDIL) 20-37.5 MG per tablet 1 tablet  1 tablet Oral BID Charolette Forward, MD   1 tablet at 09/22/17 2131  . lidocaine (LIDODERM) 5 % 1 patch  1 patch Transdermal Q24H Charolette Forward, MD   1 patch at 09/22/17 0953  . MEDLINE mouth rinse  15 mL Mouth Rinse BID Charolette Forward, MD   15 mL at 09/22/17 2131  . nitroGLYCERIN (NITROSTAT) SL tablet 0.4 mg  0.4 mg Sublingual Q5 min PRN Charolette Forward, MD      . ondansetron (ZOFRAN) injection 4 mg  4 mg Intravenous Q6H PRN Charolette Forward, MD      . oxyCODONE-acetaminophen (PERCOCET/ROXICET) 5-325 MG per tablet 1 tablet  1 tablet Oral Q6H PRN Charolette Forward, MD   1 tablet at 09/23/17 0455  . pantoprazole (PROTONIX) EC tablet 40 mg  40 mg Oral Q0600 Charolette Forward, MD   40 mg at 09/23/17 0455  . polyethylene glycol (MIRALAX / GLYCOLAX) packet 17 g  17 g Oral Daily PRN Charolette Forward, MD   17 g at 09/13/17 2222  . potassium chloride (K-DUR,KLOR-CON) CR tablet 10 mEq  10 mEq Oral Daily Charolette Forward, MD   10 mEq at 09/22/17 0954  . sodium chloride flush (NS) 0.9 % injection 3 mL  3 mL Intravenous Q12H Charolette Forward, MD   3 mL at 09/22/17 2133  . sodium chloride flush (NS) 0.9 % injection 3 mL  3 mL Intravenous PRN Charolette Forward, MD      . tamsulosin (FLOMAX) capsule 0.4 mg  0.4 mg Oral Daily Charolette Forward, MD   0.4 mg at 09/22/17 0954  . ticagrelor (BRILINTA) tablet 90 mg  90 mg Oral BID Charolette Forward, MD   90 mg at 09/22/17 2131     Discharge Medications: Please see discharge summary for a list of  discharge medications.  Relevant Imaging Results:  Relevant Lab Results:   Additional Information SSN: [id number]  Estanislado Emms, LCSW

## 2017-09-23 NOTE — Progress Notes (Signed)
Subjective:  Denies any chest pain or shortness of breath, leg swelling persists, right more than left.  White count trending down and diuresed well.  Urine culture insignificant growth  Objective:  Vital Signs in the last 24 hours: Temp:  [97.9 F (36.6 C)-98.1 F (36.7 C)] 98.1 F (36.7 C) (07/08 0801) Pulse Rate:  [81-87] 84 (07/08 0801) Resp:  [13-22] 22 (07/08 0801) BP: (127-141)/(69-93) 141/79 (07/08 0801) SpO2:  [95 %-100 %] 99 % (07/08 0801) Weight:  [109.8 kg (242 lb)] 109.8 kg (242 lb) (07/08 0525)  Intake/Output from previous day: 07/07 0701 - 07/08 0700 In: 980 [P.O.:980] Out: 3425 [Urine:3425] Intake/Output from this shift: No intake/output data recorded.  Physical Exam: Neck: no adenopathy, no carotid bruit, no JVD and supple, symmetrical, trachea midline Lungs: decreased breath sounds in bases Heart: regular rate and rhythm, S1, S2 normal and soft systolic murmur noted Abdomen: soft, non-tender; bowel sounds normal; no masses,  no organomegaly Extremities: no clubbing, cyanosis, 2+ edema noted, right more than the left  Lab Results: Recent Labs    09/22/17 0606 09/23/17 0426  WBC 15.5* 12.0*  HGB 7.8* 8.8*  PLT 366 434*   Recent Labs    09/22/17 0606 09/23/17 0426  NA 140  140 140  K 3.7  3.6 3.8  CL 106  106 105  CO2 26  25 24   GLUCOSE 137*  139* 132*  BUN 37*  38* 32*  CREATININE 2.81*  2.84* 2.67*   No results for input(s): TROPONINI in the last 72 hours.  Invalid input(s): CK, MB Hepatic Function Panel Recent Labs    09/23/17 0426  ALBUMIN 1.9*   No results for input(s): CHOL in the last 72 hours. No results for input(s): PROTIME in the last 72 hours.  Imaging: Imaging results have been reviewed and No results found.  Cardiac Studies:  Assessment/Plan:  Status post sustained V. Tach cardiac arrest Status post inferolateral wall myocardial infarction status post PCI to OM 2 Multivessel CAD with critical LAD  stenosisKstatus post PTCA stenting to mid and distal LAD with excellent angiographic results and intra-aortic balloon pump insertion Ischemic cardiomyopathy Metabolic acidosis Hypertensive heart disease with systolic dysfunction Decompensated systolic congestive heart failure Insulin requiring diabetes mellitus History of right CVA with left paresis History of tobacco abuse Acute on chronic kidney injury stage IV secondary to contrast improving Anemia of chronic disease Peripheral vascular disease Positive family history of coronary artery disease Marked leukocytosis, rule out UTI. Plan DC Foley catheter. Lasix 40 mg IV 1. Elevate legs  30 while in bed. Social services for discharge planning    LOS: 12 days    Charolette Forward 09/23/2017, 9:31 AM

## 2017-09-23 NOTE — Progress Notes (Signed)
Patient is resting comfortably on room air with no respiratory distress noted. BIPAP is not needed at this time. RT will monitor as needed.

## 2017-09-23 NOTE — Care Management (Signed)
Pt has been fitted for Life Vest.  Per CSW pt will have bed at [facility] 7/9.

## 2017-09-23 NOTE — Progress Notes (Signed)
Subjective: Interval History: has no complaint, ready to go.  Objective: Vital signs in last 24 hours: Temp:  [97.7 F (36.5 C)-98.1 F (36.7 C)] 97.7 F (36.5 C) (07/08 1130) Pulse Rate:  [81-87] 86 (07/08 1130) Resp:  [13-22] 15 (07/08 1130) BP: (127-152)/(72-96) 152/96 (07/08 1130) SpO2:  [95 %-100 %] 98 % (07/08 1130) Weight:  [109.8 kg (242 lb)] 109.8 kg (242 lb) (07/08 0525) Weight change: -1.633 kg (-3 lb 9.6 oz)  Intake/Output from previous day: 07/07 0701 - 07/08 0700 In: 980 [P.O.:980] Out: 3425 [Urine:3425] Intake/Output this shift: Total I/O In: 480 [P.O.:480] Out: -   General appearance: alert, cooperative, no distress and moderately obese Resp: diminished breath sounds bilaterally Cardio: S1, S2 normal and systolic murmur: holosystolic 2/6, blowing at apex GI: soft, non-tender; bowel sounds normal; no masses,  no organomegaly Extremities: bandage L foot  Lab Results: Recent Labs    09/22/17 0606 09/23/17 0426  WBC 15.5* 12.0*  HGB 7.8* 8.8*  HCT 25.2* 28.3*  PLT 366 434*   BMET:  Recent Labs    09/22/17 0606 09/23/17 0426  NA 140  140 140  K 3.7  3.6 3.8  CL 106  106 105  CO2 26  25 24   GLUCOSE 137*  139* 132*  BUN 37*  38* 32*  CREATININE 2.81*  2.84* 2.67*  CALCIUM 8.0*  8.1* 8.3*   No results for input(s): PTH in the last 72 hours. Iron Studies: No results for input(s): IRON, TIBC, TRANSFERRIN, FERRITIN in the last 72 hours.  Studies/Results: No results found.  I have reviewed the patient's current medications.  Assessment/Plan: 1 AKI/CKD3-4.  Improving GFR slowly.  CKD plus contrast , then arrest.  Nonoliguric still vol xs 2 CAD 3 VT 4 DM 5 Anemia 6 HTN controlled 7 CVA P cont lasix, will s/o at this time f/u with Dr. Arty Baumgartner in 2-3 wk     LOS: 12 days   Jeneen Rinks Markeem Noreen 09/23/2017,1:19 PM

## 2017-09-23 NOTE — Progress Notes (Signed)
Physical Therapy Treatment Patient Details Name: Tommy Mclaughlin MRN: [id number] DOB: 01-27-1966 Today's Date: 09/23/2017    History of Present Illness 52 y.o. male admitted 6/26 with chest pain SOB and leg pain, code STEMI was called and had emergency PCI. Subsequently had VT cardiac arrest requiring CPR x 2 mins and defibrillation on 7/2. PMH includes: TD2DM, CVA with L sided residual weakness, CHF, HTN,     PT Comments    RN reports pt refused cardiac rehab 4x today. On arrival to room pt agreeable to work with therapy after encouragement.  Pt required min guard for ambulation in hall. Max frequent cues throughout session for safety with RW and transfers. Continue to recommend SNF at d/c as pt is at high risk for falls due to decreased safety awareness and will have long periods of time w/o supervision on return home.    Follow Up Recommendations  SNF     Equipment Recommendations  (TBD next venue)    Recommendations for Other Services OT consult     Precautions / Restrictions Precautions Precautions: Fall Restrictions Weight Bearing Restrictions: No    Mobility  Bed Mobility Overal bed mobility: Modified Independent              Transfers Overall transfer level: Needs assistance Equipment used: Rolling walker (2 wheeled) Transfers: Sit to/from Stand Sit to Stand: Min guard         General transfer comment: Increased time and effort to rise into standing. Poor technique with RW. Cues for RW proximity and to scoot forward  Ambulation/Gait Ambulation/Gait assistance: Min guard   Assistive device: Rolling walker (2 wheeled) Gait Pattern/deviations: Step-through pattern;Trunk flexed;Wide base of support Gait velocity: decreased   General Gait Details: Pt required 1 standing rest brake during ambulation. Cues for RW proximity, postural control, and forward gaze. Pt states he is unable to correct posture secondary to rib pain.   Stairs              Wheelchair Mobility    Modified Rankin (Stroke Patients Only)       Balance Overall balance assessment: Needs assistance Sitting-balance support: Feet supported Sitting balance-Leahy Scale: Good     Standing balance support: Single extremity supported;During functional activity Standing balance-Leahy Scale: Fair Standing balance comment: able to maintain static standing without UE support with close min guard assist.                             Cognition Arousal/Alertness: Awake/alert Behavior During Therapy: WFL for tasks assessed/performed Overall Cognitive Status: Within Functional Limits for tasks assessed                                 General Comments: Cues for safety and problem solving      Exercises      General Comments General comments (skin integrity, edema, etc.): HR remained in low 90's throughout session.      Pertinent Vitals/Pain Pain Assessment: Faces Faces Pain Scale: Hurts little more Pain Location: L side ribs Pain Descriptors / Indicators: Discomfort Pain Intervention(s): Limited activity within patient's tolerance;Monitored during session;Repositioned    Home Living                      Prior Function            PT Goals (current goals can now be found in the  care plan section) Acute Rehab PT Goals Patient Stated Goal: "regain my independence"  PT Goal Formulation: With patient Time For Goal Achievement: 09/26/17 Potential to Achieve Goals: Good Progress towards PT goals: Progressing toward goals    Frequency    Min 3X/week      PT Plan Current plan remains appropriate    Co-evaluation              AM-PAC PT "6 Clicks" Daily Activity  Outcome Measure  Difficulty turning over in bed (including adjusting bedclothes, sheets and blankets)?: None Difficulty moving from lying on back to sitting on the side of the bed? : None Difficulty sitting down on and standing up from a chair  with arms (e.g., wheelchair, bedside commode, etc,.)?: Unable Help needed moving to and from a bed to chair (including a wheelchair)?: A Little Help needed walking in hospital room?: A Little Help needed climbing 3-5 steps with a railing? : A Lot 6 Click Score: 17    End of Session Equipment Utilized During Treatment: Gait belt Activity Tolerance: Patient tolerated treatment well Patient left: in chair;with call bell/phone within reach;with family/visitor present Nurse Communication: Mobility status PT Visit Diagnosis: Unsteadiness on feet (R26.81);Muscle weakness (generalized) (M62.81);Pain;Difficulty in walking, not elsewhere classified (R26.2) Pain - Right/Left: Left Pain - part of body: (Ribs)     Time: 1438-8875 PT Time Calculation (min) (ACUTE ONLY): 16 min  Charges:  $Gait Training: 8-22 mins                    G Codes:       Benjiman Core, [place] Pager [phone] Acute Rehab    Allena Katz 09/23/2017, 3:26 PM

## 2017-09-23 NOTE — Clinical Social Work Note (Signed)
Clinical Social Work Assessment  Patient Details  Name: Tommy Mclaughlin MRN: [id number] Date of Birth: [birth year]  Date of referral:  09/23/17               Reason for consult:  Discharge Planning, Facility Placement                Permission sought to share information with:  Facility Sport and exercise psychologist, Family Supports Permission granted to share information::  Yes, Verbal Permission Granted  Name::     Nikolas Casher  Agency::  SNFs  Relationship::  brother  Contact Information:  [phone]  Housing/Transportation Living arrangements for the past 2 months:  Single Family Home Source of Information:  Patient Patient Interpreter Needed:  None Criminal Activity/Legal Involvement Pertinent to Current Situation/Hospitalization:  No - Comment as needed Significant Relationships:  Siblings, Other Family Members Lives with:  Other (Comment)(cousin) Do you feel safe going back to the place where you live?  Yes Need for family participation in patient care:  No (Coment)  Care giving concerns: Patient from home with cousin. Patient has been fitted for Life Vest. PT recommending SNF.   Social Worker assessment / plan: CSW met with patient at bedside. Patient alert and oriented, sitting up in bedside chair. CSW introduced self and role and discussed disposition planning. Patient indicated he wants to move back up to [place] where he is from, with his brother, Tommy Mclaughlin, and sister-in-law. Patient wanted to call brother and his wife to participate in disposition planning coversation with CSW.   CSW called brother and his wife on speaker phone with patient. Brother and his wife indicated patient can come and live with them, and they have been working on finding doctors for patient to follow up with. Daniel's wife had questions about which specialists patient will need to follow up. She stated that rehab is essential for patient and he needs to get stronger before they can transport and go  through with a move for patient.   Patient agreeable to short term SNF in [place] after conversation with CSW and family. Patient also expressed he needs to learn to use the Life Vest; he does not feel comfortable using it correctly. Patient indicated he is aware of the importance of using it correctly and would like more teaching on it to use it safely.  CSW faxed out referrals and gave patient bed offers for facilities that accept patients with Life Vests. CSW to follow for choice and support with discharge planning.  Employment status:    Insurance information:  Medicare PT Recommendations:  [place] / Referral to community resources:  [place]  Patient/Family's Response to care: Patient and family appreciative of care.  Patient/Family's Understanding of and Emotional Response to Diagnosis, Current Treatment, and Prognosis: Patient and family with understanding of patient's condition, though patient not confident to use Life Vest correctly. Patient and family agreeable to SNF.  Emotional Assessment Appearance:  Appears stated age Attitude/Demeanor/Rapport:  Engaged Affect (typically observed):  Accepting, Calm, Appropriate, Pleasant Orientation:  Oriented to Self, Oriented to Place, Oriented to  Time, Oriented to Situation Alcohol / Substance use:  Not Applicable Psych involvement (Current and /or in the community):  No (Comment)  Discharge Needs  Concerns to be addressed:  Discharge Planning Concerns, Care Coordination Readmission within the last 30 days:  No Current discharge risk:  Physical Impairment Barriers to Discharge:  Continued Medical Work up   Estanislado Emms, LCSW 09/23/2017, 11:05 AM

## 2017-09-23 NOTE — Progress Notes (Signed)
   09/23/17 1300  Clinical Encounter Type  Visited With Family  Visit Type Initial  Referral From Nurse  Consult/Referral To Chaplain  Spiritual Encounters  Spiritual Needs Brochure;Emotional  Stress Factors  Patient Stress Factors Exhausted  Family Stress Factors Exhausted    Pt was alone laying on his bed using his phone. No family member present and pt talked about poor family dynamics. Chaplain and pt had conversations about pt's health journey which seemingly was a big concern for the pt. Chaplain provided empathic reflective listening and compassionate presence.  Pennie Vanblarcom a Medical sales representative, [employer]

## 2017-09-23 NOTE — Progress Notes (Signed)
Occupational Therapy Treatment Patient Details Name: Tommy Mclaughlin MRN: [id number] DOB: 1965-12-19 Today's Date: 09/23/2017    History of present illness 52 y.o. male admitted 6/26 with chest pain SOB and leg pain, code STEMI was called and had emergency PCI. Subsequently had VT cardiac arrest requiring CPR x 2 mins and defibrillation on 7/2. PMH includes: TD2DM, CVA with L sided residual weakness, CHF, HTN,    OT comments  Pt progressing towards established OT goals. Pt continues to present with decreased strength and endurance. Pt performing grooming at sink with Min guard for safety and requires increased encouragement to increase time standing and challenge activity tolerance. Pt requiring Mod A for LB dressing and Min VCs for problem solving how to don socks. Pt verbalizing concern about dc and stating "I don't want to be a burden on my family." Continue to recommend dc to SNF for ST-rehab and will continue to follow acutely as admitted.    Follow Up Recommendations  SNF;Supervision/Assistance - 24 hour    Equipment Recommendations  Tub/shower bench;3 in 1 bedside commode    Recommendations for Other Services      Precautions / Restrictions Precautions Precautions: Fall Restrictions Weight Bearing Restrictions: No       Mobility Bed Mobility               General bed mobility comments: sitting at EOB  Transfers Overall transfer level: Needs assistance Equipment used: Rolling walker (2 wheeled) Transfers: Sit to/from Stand Sit to Stand: Min guard         General transfer comment: Min Guard A for safety    Balance Overall balance assessment: Needs assistance Sitting-balance support: Feet supported Sitting balance-Leahy Scale: Good     Standing balance support: Single extremity supported;During functional activity Standing balance-Leahy Scale: Fair Standing balance comment: able to maintain static standing without UE support with close min guard assist.                             ADL either performed or assessed with clinical judgement   ADL Overall ADL's : Needs assistance/impaired     Grooming: Oral care;Wash/dry face;Min guard;Standing Grooming Details (indicate cue type and reason): Pt performing grooming at sink with MIn guard A for safety. Pt continues to demosntrate decreased endurance.              Lower Body Dressing: Minimal assistance;Sit to/from stand Lower Body Dressing Details (indicate cue type and reason): Mod A for donning left sock. Pt unable to bring ankle to knee or reach forward to don sock. Pt bringing right ankle to knee and required increased effort and time.             Functional mobility during ADLs: Min guard;Rolling walker General ADL Comments: Pt requiring increased encouragement for occuaptional particpation. Provding Min cues for problem solving during LB ADLs.      Vision       Perception     Praxis      Cognition Arousal/Alertness: Awake/alert Behavior During Therapy: WFL for tasks assessed/performed Overall Cognitive Status: Within Functional Limits for tasks assessed                                 General Comments: Continue to require cues for safety. Min cues for problem solving during LB dressing        Exercises  Shoulder Instructions       General Comments VSS. Pt verbalizing concern about dc and that he doesn't want to be a burden on his family. Pt focusing on this topic and stating "I wanted to be DNR at first because it wasn't worth living"    Pertinent Vitals/ Pain       Pain Assessment: Faces Faces Pain Scale: Hurts little more Pain Location: L side ribs Pain Descriptors / Indicators: Discomfort Pain Intervention(s): Monitored during session;Limited activity within patient's tolerance;Repositioned  Home Living                                          Prior Functioning/Environment              Frequency  Min  2X/week        Progress Toward Goals  OT Goals(current goals can now be found in the care plan section)  Progress towards OT goals: Progressing toward goals  Acute Rehab OT Goals Patient Stated Goal: "regain my independence"  OT Goal Formulation: With patient Time For Goal Achievement: 10/04/17 Potential to Achieve Goals: Good ADL Goals Pt Will Perform Grooming: with modified independence;standing Pt Will Perform Upper Body Bathing: with modified independence;sitting;standing Pt Will Perform Lower Body Bathing: with modified independence;sit to/from stand Pt Will Perform Upper Body Dressing: with modified independence;sitting Pt Will Perform Lower Body Dressing: with modified independence;sit to/from stand Pt Will Transfer to Toilet: with modified independence;ambulating;regular height toilet;bedside commode;grab bars Pt Will Perform Toileting - Clothing Manipulation and hygiene: with modified independence;sit to/from stand Pt Will Perform Tub/Shower Transfer: Tub transfer;with supervision;ambulating;tub bench;rolling walker  Plan Discharge plan remains appropriate    Co-evaluation                 AM-PAC PT "6 Clicks" Daily Activity     Outcome Measure   Help from another person eating meals?: None Help from another person taking care of personal grooming?: A Little Help from another person toileting, which includes using toliet, bedpan, or urinal?: A Little Help from another person bathing (including washing, rinsing, drying)?: A Little Help from another person to put on and taking off regular upper body clothing?: A Little Help from another person to put on and taking off regular lower body clothing?: A Little 6 Click Score: 19    End of Session Equipment Utilized During Treatment: Rolling walker  OT Visit Diagnosis: Unsteadiness on feet (R26.81)   Activity Tolerance Patient tolerated treatment well   Patient Left in chair;with call bell/phone within  reach;with nursing/sitter in room   Nurse Communication Mobility status        Time: 0936-1000 OT Time Calculation (min): 24 min  Charges: OT General Charges $OT Visit: 1 Visit OT Treatments $Self Care/Home Management : 23-37 mins  [place], OTR/L Acute Rehab Pager: [phone] Office: [place] 09/23/2017, 1:37 PM

## 2017-09-23 NOTE — Care Management Important Message (Signed)
Important Message  Patient Details  Name: Tommy Mclaughlin MRN: [id number] Date of Birth: December 10, 1965   Medicare Important Message Given:  Yes    Treson Laura P Yardley Beltran 09/23/2017, 2:16 PM

## 2017-09-24 ENCOUNTER — Ambulatory Visit: Payer: Medicare Other | Admitting: Podiatry

## 2017-09-24 ENCOUNTER — Ambulatory Visit: Payer: Medicare Other | Admitting: Cardiovascular Disease

## 2017-09-24 LAB — RENAL FUNCTION PANEL
Albumin: 1.7 g/dL — ABNORMAL LOW (ref 3.5–5.0)
Anion gap: 12 (ref 5–15)
BUN: 28 mg/dL — ABNORMAL HIGH (ref 6–20)
CHLORIDE: 104 mmol/L (ref 98–111)
CO2: 24 mmol/L (ref 22–32)
CREATININE: 2.53 mg/dL — AB (ref 0.61–1.24)
Calcium: 8.1 mg/dL — ABNORMAL LOW (ref 8.9–10.3)
GFR calc Af Amer: 32 mL/min — ABNORMAL LOW (ref >60)
GFR, EST NON AFRICAN AMERICAN: 28 mL/min — AB (ref >60)
Glucose, Bld: 115 mg/dL — ABNORMAL HIGH (ref 70–99)
PHOSPHORUS: 3.3 mg/dL (ref 2.5–4.6)
Potassium: 3.7 mmol/L (ref 3.5–5.1)
Sodium: 140 mmol/L (ref 135–145)

## 2017-09-24 LAB — CBC
HEMATOCRIT: 27.3 % — AB (ref 39.0–52.0)
Hemoglobin: 8.3 g/dL — ABNORMAL LOW (ref 13.0–17.0)
MCH: 25.7 pg — ABNORMAL LOW (ref 26.0–34.0)
MCHC: 30.4 g/dL (ref 30.0–36.0)
MCV: 84.5 fL (ref 78.0–100.0)
PLATELETS: 408 10*3/uL — AB (ref 150–400)
RBC: 3.23 MIL/uL — ABNORMAL LOW (ref 4.22–5.81)
RDW: 17.1 % — ABNORMAL HIGH (ref 11.5–15.5)
WBC: 8.8 10*3/uL (ref 4.0–10.5)

## 2017-09-24 LAB — GLUCOSE, CAPILLARY
GLUCOSE-CAPILLARY: 113 mg/dL — AB (ref 70–99)
Glucose-Capillary: 115 mg/dL — ABNORMAL HIGH (ref 70–99)
Glucose-Capillary: 172 mg/dL — ABNORMAL HIGH (ref 70–99)
Glucose-Capillary: 216 mg/dL — ABNORMAL HIGH (ref 70–99)
Glucose-Capillary: 144 mg/dL — ABNORMAL HIGH (ref 70–99)
Glucose-Capillary: 147 mg/dL — ABNORMAL HIGH (ref 70–99)

## 2017-09-24 NOTE — Progress Notes (Signed)
Patient has chosen [facility]. [facility] staff completed admissions paperwork with patient yesterday evening at bedside are are ready to admit patient today. Paged MD. CSW to support with discharge.  Estanislado Emms, [place]

## 2017-09-24 NOTE — Progress Notes (Signed)
Subjective:  Denies any chest pain or shortness of breath.  Still has Foley catheter in.renal function gradually improving.  White count is back to normal  Objective:  Vital Signs in the last 24 hours: Temp:  [97.6 F (36.4 C)-98.9 F (37.2 C)] 97.8 F (36.6 C) (07/09 1609) Pulse Rate:  [84-91] 84 (07/09 1609) Resp:  [15-25] 15 (07/09 1139) BP: (114-153)/(70-91) 133/89 (07/09 1609) SpO2:  [92 %-100 %] 100 % (07/09 1609) Weight:  [108.9 kg (240 lb)] 108.9 kg (240 lb) (07/09 0415)  Intake/Output from previous day: 07/08 0701 - 07/09 0700 In: 1080 [P.O.:1080] Out: 2225 [Urine:2225] Intake/Output from this shift: Total I/O In: 360 [P.O.:360] Out: 200 [Urine:200]  Physical Exam: Neck: no adenopathy, no carotid bruit, no JVD and supple, symmetrical, trachea midline Lungs: clear to auscultation bilaterally Heart: regular rate and rhythm, S1, S2 normal and soft systolic murmur noted Abdomen: soft, non-tender; bowel sounds normal; no masses,  no organomegaly Extremities: no clubbing, cyanosis, 1+ edema noted  Lab Results: Recent Labs    09/23/17 0426 09/24/17 0636  WBC 12.0* 8.8  HGB 8.8* 8.3*  PLT 434* 408*   Recent Labs    09/23/17 0426 09/24/17 0636  NA 140 140  K 3.8 3.7  CL 105 104  CO2 24 24  GLUCOSE 132* 115*  BUN 32* 28*  CREATININE 2.67* 2.53*   No results for input(s): TROPONINI in the last 72 hours.  Invalid input(s): CK, MB Hepatic Function Panel Recent Labs    09/24/17 0636  ALBUMIN 1.7*   No results for input(s): CHOL in the last 72 hours. No results for input(s): PROTIME in the last 72 hours.  Imaging: Imaging results have been reviewed and No results found.  Cardiac Studies:  Assessment/Plan:  Status post sustained V. Tach cardiac arrest Status post inferolateral wall myocardial infarction status post PCI to OM 2 Multivessel CAD with critical LAD stenosisKstatus post PTCA stenting to mid and distal LAD with excellent angiographic results  and intra-aortic balloon pump insertion Ischemic cardiomyopathy Metabolic acidosis Hypertensive heart disease with systolic dysfunction Decompensated systolic congestive heart failure Insulin requiring diabetes mellitus History of right CVA with left paresis History of tobacco abuse Acute on chronic kidney injury stage IV secondary to contrast improving Anemia of chronic disease Peripheral vascular disease Positive family history of coronary artery disease Status post possible UTI. Superficial skin ulcers Plan DC Foley catheter. Will discharge to skilled nursing facility tomorrow    LOS: 13 days    Tommy Mclaughlin 09/24/2017, 5:37 PM

## 2017-09-24 NOTE — Progress Notes (Signed)
Physical Therapy Treatment Patient Details Name: Tommy Mclaughlin MRN: [id number] DOB: 1965-04-08 Today's Date: 09/24/2017    History of Present Illness 52 y.o. male admitted 6/26 with chest pain SOB and leg pain, code STEMI was called and had emergency PCI. Subsequently had VT cardiac arrest requiring CPR x 2 mins and defibrillation on 7/2. PMH includes: TD2DM, CVA with L sided residual weakness, CHF, HTN,     PT Comments    Pt able to perform gait with supervision with RW this session. Continues to require frequent standing rest breaks due to LE fatigue.   Follow Up Recommendations  SNF     Equipment Recommendations  (TBD next venue)    Recommendations for Other Services       Precautions / Restrictions Precautions Precautions: Fall    Mobility  Bed Mobility               General bed mobility comments: sitting at EOB  Transfers   Equipment used: Rolling walker (2 wheeled) Transfers: Sit to/from Stand Sit to Stand: Supervision Stand pivot transfers: Supervision       General transfer comment: cues for UE placement, increased effort to rise into standing  Ambulation/Gait Ambulation/Gait assistance: Supervision Gait Distance (Feet): 180 Feet Assistive device: Rolling walker (2 wheeled) Gait Pattern/deviations: Step-through pattern;Trunk flexed;Wide base of support Gait velocity: decreased   General Gait Details: 2 x standing rest break due to LE fatigue. cues for RW use. forward flexed posture due to rib pain.  SOB with gait resolves with seated rest   Stairs             Wheelchair Mobility    Modified Rankin (Stroke Patients Only)       Balance               Standing balance comment: able to maintain static standing without UE support with close min guard assist.                             Cognition Arousal/Alertness: Awake/alert Behavior During Therapy: WFL for tasks assessed/performed Overall Cognitive Status: Within  Functional Limits for tasks assessed                                        Exercises      General Comments        Pertinent Vitals/Pain Faces Pain Scale: Hurts little more Pain Location: L side ribs Pain Descriptors / Indicators: Discomfort Pain Intervention(s): Limited activity within patient's tolerance;Monitored during session    Home Living                      Prior Function            PT Goals (current goals can now be found in the care plan section) Progress towards PT goals: Progressing toward goals    Frequency    Min 3X/week      PT Plan Current plan remains appropriate    Co-evaluation              AM-PAC PT "6 Clicks" Daily Activity  Outcome Measure  Difficulty turning over in bed (including adjusting bedclothes, sheets and blankets)?: None Difficulty moving from lying on back to sitting on the side of the bed? : None Difficulty sitting down on and standing up from a chair with arms (  e.g., wheelchair, bedside commode, etc,.)?: A Little Help needed moving to and from a bed to chair (including a wheelchair)?: A Little Help needed walking in hospital room?: A Little Help needed climbing 3-5 steps with a railing? : A Lot 6 Click Score: 19    End of Session   Activity Tolerance: Patient tolerated treatment well Patient left: in bed;with call bell/phone within reach Nurse Communication: Mobility status PT Visit Diagnosis: Unsteadiness on feet (R26.81);Muscle weakness (generalized) (M62.81);Pain;Difficulty in walking, not elsewhere classified (R26.2) Pain - Right/Left: Left Pain - part of body: (ribs)     Time: 6967-8938 PT Time Calculation (min) (ACUTE ONLY): 10 min  Charges:  $Gait Training: 8-22 mins                    G Codes:      Isabelle Course, PT, DPT   Tommy Mclaughlin 09/24/2017, 10:10 AM

## 2017-09-24 NOTE — Progress Notes (Signed)
Occupational Therapy Treatment Patient Details Name: Tommy Mclaughlin MRN: [id number] DOB: 11-May-1965 Today's Date: 09/24/2017    History of present illness 52 y.o. male admitted 6/26 with chest pain SOB and leg pain, code STEMI was called and had emergency PCI. Subsequently had VT cardiac arrest requiring CPR x 2 mins and defibrillation on 7/2. PMH includes: TD2DM, CVA with L sided residual weakness, CHF, HTN,    OT comments  Pt demonstrating progress toward OT goals. Educated pt concerning energy conservation strategies with handout provided. He was able to engage this information and report some strategies he uses at home. Pt was additionally able to complete functional mobility to sink with min guard assist and complete grooming tasks with supervision. Pt continues to fatigue easily and require rest after single grooming task. Will continue to follow while admitted.    Follow Up Recommendations  SNF;Supervision/Assistance - 24 hour    Equipment Recommendations  Tub/shower bench;3 in 1 bedside commode    Recommendations for Other Services      Precautions / Restrictions Precautions Precautions: Fall Restrictions Weight Bearing Restrictions: No       Mobility Bed Mobility               General bed mobility comments: seated at EOB on my arrival   Transfers Overall transfer level: Needs assistance Equipment used: Rolling walker (2 wheeled) Transfers: Sit to/from Stand Sit to Stand: Supervision         General transfer comment: Supervision for safety from EOB. Min guard assist once ambulating.     Balance Overall balance assessment: Needs assistance Sitting-balance support: Feet supported Sitting balance-Leahy Scale: Good     Standing balance support: Single extremity supported;During functional activity Standing balance-Leahy Scale: Fair Standing balance comment: Pt able to sustain balance standing at sink without UE support. Relies on UE support during  ambulation.                            ADL either performed or assessed with clinical judgement   ADL Overall ADL's : Needs assistance/impaired Eating/Feeding: Independent   Grooming: Wash/dry face;Supervision/safety;Standing Grooming Details (indicate cue type and reason): Pt able to participate in standing grooming tasks with supervision. He becomes fatigued easily.                  Toilet Transfer: Min guard;Ambulation;RW   Toileting- Water quality scientist and Hygiene: Min guard;Sit to/from stand       Functional mobility during ADLs: Min guard;Rolling walker General ADL Comments: Provided education concerning energy conservation strategies with handout provided. He is able to verbalize strategies that he has used.      Vision       Perception     Praxis      Cognition Arousal/Alertness: Awake/alert Behavior During Therapy: WFL for tasks assessed/performed Overall Cognitive Status: Within Functional Limits for tasks assessed                                 General Comments: Pt was functional with basic tasks although feel that in a more distracting environment, pt will demonstrate difficulty with attention to tasks. Will challenge next session.         Exercises     Shoulder Instructions       General Comments VSS throughout session.     Pertinent Vitals/ Pain       Pain Assessment: No/denies  pain  Home Living                                          Prior Functioning/Environment              Frequency  Min 2X/week        Progress Toward Goals  OT Goals(current goals can now be found in the care plan section)  Progress towards OT goals: Progressing toward goals  Acute Rehab OT Goals Patient Stated Goal: "regain my independence"  OT Goal Formulation: With patient Time For Goal Achievement: 10/04/17 Potential to Achieve Goals: Good  Plan Discharge plan remains appropriate     Co-evaluation                 AM-PAC PT "6 Clicks" Daily Activity     Outcome Measure   Help from another person eating meals?: None Help from another person taking care of personal grooming?: None Help from another person toileting, which includes using toliet, bedpan, or urinal?: A Little Help from another person bathing (including washing, rinsing, drying)?: A Little Help from another person to put on and taking off regular upper body clothing?: A Little Help from another person to put on and taking off regular lower body clothing?: A Little 6 Click Score: 20    End of Session Equipment Utilized During Treatment: Rolling walker  OT Visit Diagnosis: Unsteadiness on feet (R26.81)   Activity Tolerance Patient tolerated treatment well   Patient Left in chair;with call bell/phone within reach;with nursing/sitter in room   Nurse Communication Mobility status        Time: 3833-3832 OT Time Calculation (min): 14 min  Charges: OT General Charges $OT Visit: 1 Visit OT Treatments $Self Care/Home Management : 8-22 mins  Norman Herrlich, MS OTR/L  Pager: [place] A Yamaira Spinner 09/24/2017, 4:57 PM

## 2017-09-24 NOTE — Progress Notes (Signed)
CARDIAC REHAB PHASE I   PRE:  Rate/Rhythm: 86 SR  BP:  Sitting: 142/84      SaO2: 97 RA  MODE:  Ambulation: 300 ft   POST:  Rate/Rhythm: 91 SR with PVCs  BP:  Sitting: 134/78    SaO2: 98 RA   Pt ambulated 372ft in hallway assist of one with front wheel rolling walker, standing rest x1. Pt states he feels "alright". Pt feels ready to go to rehab so he can then head to [facility]. Will continue to follow.    [id number] Rufina Falco, RN BSN 09/24/2017 11:38 AM

## 2017-09-24 NOTE — Progress Notes (Signed)
Pt has been off bipap for several days.  RT dc'd order at this time. RT will  Monitor.

## 2017-09-24 NOTE — Progress Notes (Signed)
Pt. has not voided yet. Bladder scanned and volume of 264ml received. Pt. denies any discomfort. States he still can't void if he needs to. Will bladder scan in four hours.

## 2017-09-24 NOTE — Progress Notes (Signed)
Removed foley at 1330 per Dr Terrence Dupont verbal order, RN instructed to bladder trial pt before discharge. At 1800 bladder scan preformed and a volume of 788mL resulted. At Axis in and out cath preformed per protocol with an output of 840mL. Pt states that he's "had trouble peeing for awhile." Will continue to monitor.

## 2017-09-25 DIAGNOSIS — E1122 Type 2 diabetes mellitus with diabetic chronic kidney disease: Secondary | ICD-10-CM | POA: Diagnosis not present

## 2017-09-25 DIAGNOSIS — K219 Gastro-esophageal reflux disease without esophagitis: Secondary | ICD-10-CM | POA: Diagnosis not present

## 2017-09-25 DIAGNOSIS — I509 Heart failure, unspecified: Secondary | ICD-10-CM | POA: Diagnosis not present

## 2017-09-25 DIAGNOSIS — S91302D Unspecified open wound, left foot, subsequent encounter: Secondary | ICD-10-CM | POA: Diagnosis not present

## 2017-09-25 DIAGNOSIS — I639 Cerebral infarction, unspecified: Secondary | ICD-10-CM | POA: Diagnosis not present

## 2017-09-25 DIAGNOSIS — Z87891 Personal history of nicotine dependence: Secondary | ICD-10-CM | POA: Diagnosis not present

## 2017-09-25 DIAGNOSIS — I480 Paroxysmal atrial fibrillation: Secondary | ICD-10-CM | POA: Diagnosis not present

## 2017-09-25 DIAGNOSIS — R072 Precordial pain: Secondary | ICD-10-CM | POA: Diagnosis not present

## 2017-09-25 DIAGNOSIS — R278 Other lack of coordination: Secondary | ICD-10-CM | POA: Diagnosis not present

## 2017-09-25 DIAGNOSIS — I5020 Unspecified systolic (congestive) heart failure: Secondary | ICD-10-CM | POA: Diagnosis not present

## 2017-09-25 DIAGNOSIS — I5043 Acute on chronic combined systolic (congestive) and diastolic (congestive) heart failure: Secondary | ICD-10-CM | POA: Diagnosis not present

## 2017-09-25 DIAGNOSIS — R0902 Hypoxemia: Secondary | ICD-10-CM | POA: Diagnosis not present

## 2017-09-25 DIAGNOSIS — R600 Localized edema: Secondary | ICD-10-CM | POA: Diagnosis not present

## 2017-09-25 DIAGNOSIS — R531 Weakness: Secondary | ICD-10-CM | POA: Diagnosis not present

## 2017-09-25 DIAGNOSIS — F17290 Nicotine dependence, other tobacco product, uncomplicated: Secondary | ICD-10-CM | POA: Diagnosis not present

## 2017-09-25 DIAGNOSIS — M1990 Unspecified osteoarthritis, unspecified site: Secondary | ICD-10-CM | POA: Diagnosis not present

## 2017-09-25 DIAGNOSIS — Z8249 Family history of ischemic heart disease and other diseases of the circulatory system: Secondary | ICD-10-CM | POA: Diagnosis not present

## 2017-09-25 DIAGNOSIS — I2119 ST elevation (STEMI) myocardial infarction involving other coronary artery of inferior wall: Secondary | ICD-10-CM | POA: Diagnosis not present

## 2017-09-25 DIAGNOSIS — I5023 Acute on chronic systolic (congestive) heart failure: Secondary | ICD-10-CM | POA: Diagnosis not present

## 2017-09-25 DIAGNOSIS — M6281 Muscle weakness (generalized): Secondary | ICD-10-CM | POA: Diagnosis not present

## 2017-09-25 DIAGNOSIS — R339 Retention of urine, unspecified: Secondary | ICD-10-CM | POA: Diagnosis not present

## 2017-09-25 DIAGNOSIS — Z716 Tobacco abuse counseling: Secondary | ICD-10-CM | POA: Diagnosis not present

## 2017-09-25 DIAGNOSIS — I255 Ischemic cardiomyopathy: Secondary | ICD-10-CM | POA: Diagnosis not present

## 2017-09-25 DIAGNOSIS — R110 Nausea: Secondary | ICD-10-CM | POA: Diagnosis not present

## 2017-09-25 DIAGNOSIS — J9601 Acute respiratory failure with hypoxia: Secondary | ICD-10-CM | POA: Diagnosis not present

## 2017-09-25 DIAGNOSIS — I252 Old myocardial infarction: Secondary | ICD-10-CM | POA: Diagnosis not present

## 2017-09-25 DIAGNOSIS — L97509 Non-pressure chronic ulcer of other part of unspecified foot with unspecified severity: Secondary | ICD-10-CM | POA: Diagnosis not present

## 2017-09-25 DIAGNOSIS — S91301D Unspecified open wound, right foot, subsequent encounter: Secondary | ICD-10-CM | POA: Diagnosis not present

## 2017-09-25 DIAGNOSIS — N184 Chronic kidney disease, stage 4 (severe): Secondary | ICD-10-CM | POA: Diagnosis not present

## 2017-09-25 DIAGNOSIS — I130 Hypertensive heart and chronic kidney disease with heart failure and stage 1 through stage 4 chronic kidney disease, or unspecified chronic kidney disease: Secondary | ICD-10-CM | POA: Diagnosis not present

## 2017-09-25 DIAGNOSIS — Z7401 Bed confinement status: Secondary | ICD-10-CM | POA: Diagnosis not present

## 2017-09-25 DIAGNOSIS — I213 ST elevation (STEMI) myocardial infarction of unspecified site: Secondary | ICD-10-CM | POA: Diagnosis not present

## 2017-09-25 DIAGNOSIS — I462 Cardiac arrest due to underlying cardiac condition: Secondary | ICD-10-CM | POA: Diagnosis not present

## 2017-09-25 DIAGNOSIS — N189 Chronic kidney disease, unspecified: Secondary | ICD-10-CM | POA: Diagnosis not present

## 2017-09-25 DIAGNOSIS — N401 Enlarged prostate with lower urinary tract symptoms: Secondary | ICD-10-CM | POA: Diagnosis not present

## 2017-09-25 DIAGNOSIS — R570 Cardiogenic shock: Secondary | ICD-10-CM | POA: Diagnosis not present

## 2017-09-25 DIAGNOSIS — S91302A Unspecified open wound, left foot, initial encounter: Secondary | ICD-10-CM | POA: Diagnosis not present

## 2017-09-25 DIAGNOSIS — D649 Anemia, unspecified: Secondary | ICD-10-CM | POA: Diagnosis not present

## 2017-09-25 DIAGNOSIS — I110 Hypertensive heart disease with heart failure: Secondary | ICD-10-CM | POA: Diagnosis not present

## 2017-09-25 DIAGNOSIS — I10 Essential (primary) hypertension: Secondary | ICD-10-CM | POA: Diagnosis not present

## 2017-09-25 DIAGNOSIS — I2510 Atherosclerotic heart disease of native coronary artery without angina pectoris: Secondary | ICD-10-CM | POA: Diagnosis not present

## 2017-09-25 DIAGNOSIS — E785 Hyperlipidemia, unspecified: Secondary | ICD-10-CM | POA: Diagnosis not present

## 2017-09-25 DIAGNOSIS — R338 Other retention of urine: Secondary | ICD-10-CM | POA: Diagnosis not present

## 2017-09-25 DIAGNOSIS — Z7982 Long term (current) use of aspirin: Secondary | ICD-10-CM | POA: Diagnosis not present

## 2017-09-25 DIAGNOSIS — I739 Peripheral vascular disease, unspecified: Secondary | ICD-10-CM | POA: Diagnosis not present

## 2017-09-25 DIAGNOSIS — E119 Type 2 diabetes mellitus without complications: Secondary | ICD-10-CM | POA: Diagnosis not present

## 2017-09-25 DIAGNOSIS — Z720 Tobacco use: Secondary | ICD-10-CM | POA: Diagnosis not present

## 2017-09-25 DIAGNOSIS — Z79899 Other long term (current) drug therapy: Secondary | ICD-10-CM | POA: Diagnosis not present

## 2017-09-25 DIAGNOSIS — M2550 Pain in unspecified joint: Secondary | ICD-10-CM | POA: Diagnosis not present

## 2017-09-25 DIAGNOSIS — R0789 Other chest pain: Secondary | ICD-10-CM | POA: Diagnosis not present

## 2017-09-25 DIAGNOSIS — R2689 Other abnormalities of gait and mobility: Secondary | ICD-10-CM | POA: Diagnosis not present

## 2017-09-25 DIAGNOSIS — Z4789 Encounter for other orthopedic aftercare: Secondary | ICD-10-CM | POA: Diagnosis not present

## 2017-09-25 DIAGNOSIS — R079 Chest pain, unspecified: Secondary | ICD-10-CM | POA: Diagnosis not present

## 2017-09-25 DIAGNOSIS — D509 Iron deficiency anemia, unspecified: Secondary | ICD-10-CM | POA: Diagnosis not present

## 2017-09-25 DIAGNOSIS — S91301A Unspecified open wound, right foot, initial encounter: Secondary | ICD-10-CM | POA: Diagnosis not present

## 2017-09-25 DIAGNOSIS — Z7984 Long term (current) use of oral hypoglycemic drugs: Secondary | ICD-10-CM | POA: Diagnosis not present

## 2017-09-25 LAB — GLUCOSE, CAPILLARY
GLUCOSE-CAPILLARY: 107 mg/dL — AB (ref 70–99)
Glucose-Capillary: 130 mg/dL — ABNORMAL HIGH (ref 70–99)
Glucose-Capillary: 182 mg/dL — ABNORMAL HIGH (ref 70–99)

## 2017-09-25 MED ORDER — CIPROFLOXACIN HCL 250 MG PO TABS: 250.0000 mg | ORAL_TABLET | Freq: Two times a day (BID) | ORAL | 0 refills | Status: AC

## 2017-09-25 MED ORDER — TICAGRELOR 90 MG PO TABS: 90.0000 mg | ORAL_TABLET | Freq: Two times a day (BID) | ORAL | 11 refills | Status: AC

## 2017-09-25 MED ORDER — POLYETHYLENE GLYCOL 3350 17 G PO PACK: 17.0000 g | PACK | Freq: Every day | ORAL | 0 refills | Status: AC | PRN

## 2017-09-25 MED ORDER — TAMSULOSIN HCL 0.4 MG PO CAPS: 0.4000 mg | ORAL_CAPSULE | Freq: Every day | ORAL | 3 refills | Status: AC

## 2017-09-25 MED ORDER — AMIODARONE HCL 200 MG PO TABS: 200.0000 mg | ORAL_TABLET | Freq: Every day | ORAL | 3 refills | Status: AC

## 2017-09-25 MED ORDER — CARVEDILOL 6.25 MG PO TABS: 6.2500 mg | ORAL_TABLET | Freq: Two times a day (BID) | ORAL | 3 refills | Status: AC

## 2017-09-25 MED ORDER — ISOSORB DINITRATE-HYDRALAZINE 20-37.5 MG PO TABS: 1.0000 | ORAL_TABLET | Freq: Two times a day (BID) | ORAL | 3 refills | Status: AC

## 2017-09-25 MED ORDER — DIGOXIN 62.5 MCG PO TABS: 0.0625 mg | ORAL_TABLET | Freq: Every day | ORAL | 3 refills | Status: AC

## 2017-09-25 MED ORDER — ASPIRIN 81 MG PO TBEC: 81.0000 mg | DELAYED_RELEASE_TABLET | Freq: Every day | ORAL | 3 refills | Status: AC

## 2017-09-25 MED ORDER — NITROGLYCERIN 0.4 MG SL SUBL: 0.4000 mg | SUBLINGUAL_TABLET | SUBLINGUAL | 12 refills | Status: AC | PRN

## 2017-09-25 MED ORDER — GLIMEPIRIDE 1 MG PO TABS: 1.0000 mg | ORAL_TABLET | ORAL | 11 refills | Status: AC

## 2017-09-25 NOTE — Discharge Summary (Signed)
Discharge summary dictated on 09/25/2017, dictation number is [phone]

## 2017-09-25 NOTE — Discharge Instructions (Signed)
Acute Coronary Syndrome °Acute coronary syndrome (ACS) is a serious problem in which there is suddenly not enough blood and oxygen reaching the heart. ACS can result in chest pain or a heart attack. °What are the causes? °This condition may be caused by: °· A buildup of fat and cholesterol inside of the arteries (atherosclerosis). This is the most common cause. The buildup (plaque) can cause the blood vessels in your heart (coronary arteries) to become narrow or blocked. Plaque can also break off to form a clot. °· A coronary spasm. °· A tearing of the coronary artery (spontaneous coronary artery dissection). °· Low blood pressure (hypotension). °· An abnormal heart beat (arrhythmia). °· Using cocaine or methamphetamine. ° °What increases the risk? °The following factors may make you more likely to develop this condition: °· Age. °· History of chest pain, heart attack, or stroke. °· Being male. °· Family history of chest pain, heart disease, or stroke. °· Smoking. °· Inactivity. °· Being overweight. °· High cholesterol. °· High blood pressure (hypertension). °· Diabetes. °· Excessive alcohol use. ° °What are the signs or symptoms? °Common symptoms of this condition include: °· Chest pain. The pain may last long, or may stop and come back (recur). It may feel like: °? Crushing or squeezing. °? Tightness, pressure, fullness, or heaviness. °· Arm, neck, jaw, or back pain. °· Heartburn or indigestion. °· Shortness of breath. °· Nausea. °· Sudden cold sweats. °· Lightheadedness. °· Dizziness. °· Tiredness (fatigue). ° °Sometimes there are no symptoms. °How is this diagnosed? °This condition may be diagnosed through: °· An electrocardiogram (ECG). This test records the impulses of the heart. °· Blood tests. °· A CT scan of the chest. °· A coronary angiogram. This procedure checks for a blockage in the coronary arteries. ° °How is this treated? °Treatment for this condition may include: °· Oxygen. °· Medicines, such  as: °? Antiplatelet medicines and blood-thinning medicines, such as aspirin. These help prevent blood clots. °? Fibrinolytic therapy. This breaks apart a blood clot. °? Blood pressure medicines. °? Nitroglycerin. °? Pain medicine. °? Cholesterol medicine. °· A procedure called coronary angioplasty and stenting. This is done to widen a narrowed artery and keep it open. °· Coronary artery bypass surgery. This allows blood to pass the blockage to reach your heart. °· Cardiac rehabilitation. This is a program that helps improve your health and well-being. It includes exercise training, education, and counseling to help you recover. ° °Follow these instructions at home: °Eating and drinking °· Follow a heart-healthy, low-salt (sodium) diet. °· Use healthy cooking methods such as roasting, grilling, broiling, baking, poaching, steaming, or stir-frying. °· Talk to a dietitian to learn about healthy cooking methods and how to eat less sodium. °Medicines °· Take over-the-counter and prescription medicines only as told by your health care provider. °· Do not take these medicines unless your health care provider approves: °? Nonsteroidal anti-inflammatory drugs (NSAIDs), such as ibuprofen, naproxen, or celecoxib. °? Vitamin supplements that contain vitamin A or vitamin E. °? Hormone replacement therapy that contains estrogen. °Activity °· Join a cardiac rehabilitation program. °· Ask your health care provider: °? What activities and exercises are safe for you. °? If you should follow specific instructions about lifting, driving, or climbing stairs. °· If you are taking aspirin and another blood thinning medicine, avoid activities that are likely to result in an injury. The medicines can increase your risk of bleeding. °Lifestyle °· Do not use any products that contain nicotine or tobacco, such as cigarettes   and e-cigarettes. If you need help quitting, ask your health care provider.  If you drink alcohol and your health care  provider says it is okay to drink, limit your alcohol intake to no more than 1 drink per day. One drink equals 12 oz of beer, 5 oz of wine, or 1 oz of hard liquor.  Maintain a healthy weight. If you need to lose weight, do it in a way that has been approved by your health care provider. General instructions  Tell all your health care providers about your heart condition, including your dentist. Some medicines can increase your risk of arrhythmia.  Manage other health conditions, such as hypertension and diabetes. These conditions affect your heart.  Learn ways to manage stress.  Get screened for depression, and seek treatment if needed.  Monitor your blood pressure if told by your health care provider.  Keep your vaccinations up to date. Get the annual influenza vaccine.  Keep all follow-up visits as told by your health care provider. This is important. Contact a health care provider if:  You feel overwhelmed or sad.  You have trouble with your daily activities. Get help right away if:  You have pain in your chest, neck, arm, jaw, stomach, or back that recurs, and: ? Lasts more than a few minutes. ? Is not relieved by taking the [place] health care provider prescribed.  You have unexplained: ? Heavy sweating. ? Heartburn or indigestion. ? Shortness of breath. ? Difficulty breathing. ? Nausea or vomiting. ? Fatigue. ? Nervousness or anxiety. ? Weakness. ? Diarrhea. ? Dark stools or blood in the stool.  You have sudden lightheadedness or dizziness.  Your blood pressure is higher than 180/120  You faint.  You feel like hurting yourself or think about taking your own life. These symptoms may represent a serious problem that is an emergency. Do not wait to see if the symptoms will go away. Get medical help right away. Call your local emergency services (911 in the [place] Do not drive yourself to the clinic or hospital. Summary  Acute coronary syndrome (ACS) is a  when there is not enough blood and oxygen being supplied to the heart. ACS can result in chest pain or a heart attack.  Acute coronary syndrome is a medical emergency. If you have any symptoms of this condition, get help right away.  Treatment includes oxygen, medicines, and procedures to open the blocked arteries and restore blood flow. This information is not intended to replace advice given to you by your health care provider. Make sure you discuss any questions you have with your health care provider. Document Released: 03/05/2005 Document Revised: 04/06/2016 Document Reviewed: 04/06/2016 Elsevier Interactive Patient Education  2018 [employer].   Acute Kidney Injury, Adult Acute kidney injury is a sudden worsening of kidney function. The kidneys are organs that have several jobs. They filter the blood to remove waste products and extra fluid. They also maintain a healthy balance of minerals and hormones in the body, which helps control blood pressure and keep bones strong. With this condition, your kidneys do not do their jobs as well as they should. This condition ranges from mild to severe. Over time it may develop into long-lasting (chronic) kidney disease. Early detection and treatment may prevent acute kidney injury from developing into a chronic condition. What are the causes? Common causes of this condition include:  A problem with blood flow to the kidneys. This may be caused by: ? Low blood pressure (hypotension)  or shock. ? Blood loss. ? Heart and blood vessel (cardiovascular) disease. ? Severe burns. ? Liver disease.  Direct damage to the kidneys. This may be caused by: ? Certain medicines. ? A kidney infection. ? Poisoning. ? Being around or in contact with toxic substances. ? A surgical wound. ? A hard, direct hit to the kidney area.  A sudden blockage of urine flow. This may be caused by: ? Cancer. ? Kidney stones. ? An enlarged prostate in males.  What are  the signs or symptoms? Symptoms of this condition may not be obvious until the condition becomes severe. Symptoms of this condition can include:  Tiredness (lethargy), or difficulty staying awake.  Nausea or vomiting.  Swelling (edema) of the face, legs, ankles, or feet.  Problems with urination, such as: ? Abdominal pain, or pain along the side of your stomach (flank). ? Decreased urine production. ? Decrease in the force of urine flow.  Muscle twitches and cramps, especially in the legs.  Confusion or trouble concentrating.  Loss of appetite.  Fever.  How is this diagnosed? This condition may be diagnosed with tests, including:  Blood tests.  Urine tests.  Imaging tests.  A test in which a sample of tissue is removed from the kidneys to be examined under a microscope (kidney biopsy).  How is this treated? Treatment for this condition depends on the cause and how severe the condition is. In mild cases, treatment may not be needed. The kidneys may heal on their own. In more severe cases, treatment will involve:  Treating the cause of the kidney injury. This may involve changing any medicines you are taking or adjusting your dosage.  Fluids. You may need specialized IV fluids to balance your body's needs.  Having a catheter placed to drain urine and prevent blockages.  Preventing problems from occurring. This may mean avoiding certain medicines or procedures that can cause further injury to the kidneys.  In some cases treatment may also require:  A procedure to remove toxic wastes from the body (dialysis or continuous renal replacement therapy - CRRT).  Surgery. This may be done to repair a torn kidney, or to remove the blockage from the urinary system.  Follow these instructions at home: Medicines  Take over-the-counter and prescription medicines only as told by your health care provider.  Do not take any new medicines without your health care provider's  approval. Many medicines can worsen your kidney damage.  Do not take any vitamin and mineral supplements without your health care provider's approval. Many nutritional supplements can worsen your kidney damage. Lifestyle  If your health care provider prescribed changes to your diet, follow them. You may need to decrease the amount of protein you eat.  Achieve and maintain a healthy weight. If you need help with this, ask your health care provider.  Start or continue an exercise plan. Try to exercise at least 30 minutes a day, 5 days a week.  Do not use any tobacco products, such as cigarettes, chewing tobacco, and e-cigarettes. If you need help quitting, ask your health care provider. General instructions  Keep track of your blood pressure. Report changes in your blood pressure as told by your health care provider.  Stay up to date with immunizations. Ask your health care provider which immunizations you need.  Keep all follow-up visits as told by your health care provider. This is important. Where to find more information:  American Association of Kidney Patients: BombTimer.gl  National Kidney Foundation: www.kidney.org  American Kidney Fund: https://mathis.com/  Life Options Rehabilitation Program: ? www.lifeoptions.org ? www.kidneyschool.org Contact a health care provider if:  Your symptoms get worse.  You develop new symptoms. Get help right away if:  You develop symptoms of worsening kidney disease, which include: ? Headaches. ? Abnormally dark or light skin. ? Easy bruising. ? Frequent hiccups. ? Chest pain. ? Shortness of breath. ? End of menstruation in women. ? Seizures. ? Confusion or altered mental status. ? Abdominal or back pain. ? Itchiness.  You have a fever.  Your body is producing less urine.  You have pain or bleeding when you urinate. Summary  Acute kidney injury is a sudden worsening of kidney function.  Acute kidney injury can be caused by  problems with blood flow to the kidneys, direct damage to the kidneys, and sudden blockage of urine flow.  Symptoms of this condition may not be obvious until it becomes severe. Symptoms may include edema, lethargy, confusion, nausea or vomiting, and problems passing urine.  This condition can usually be diagnosed with blood tests, urine tests, and imaging tests. Sometimes a kidney biopsy is done to diagnose this condition.  Treatment for this condition often involves treating the underlying cause. It is treated with fluids, medicines, dialysis, diet changes, or surgery. This information is not intended to replace advice given to you by your health care provider. Make sure you discuss any questions you have with your health care provider. Document Released: 09/18/2010 Document Revised: 07/05/2016 Document Reviewed: 02/24/2016 Elsevier Interactive Patient Education  [employer].

## 2017-09-25 NOTE — Clinical Social Work Placement (Signed)
   CLINICAL SOCIAL WORK PLACEMENT  NOTE  Date:  09/25/2017  Patient Details  Name: Tommy Mclaughlin MRN: [id number] Date of Birth: Mar 02, 1966  Clinical Social Work is seeking post-discharge placement for this patient at the [place] level of care (*CSW will initial, date and re-position this form in  chart as items are completed):  Yes   Patient/family provided with [facility] Work Department's list of facilities offering this level of care within the geographic area requested by the patient (or if unable, by the patient's family).  Yes   Patient/family informed of their freedom to choose among providers that offer the needed level of care, that participate in Medicare, Medicaid or managed care program needed by the patient, have an available bed and are willing to accept the patient.  Yes   Patient/family informed of [facility]'s ownership interest in [facility] and [facility], as well as of the fact that they are under no obligation to receive care at these facilities.  PASRR submitted to EDS on 09/23/17     PASRR number received on 09/23/17     Existing PASRR number confirmed on       FL2 transmitted to all facilities in geographic area requested by pt/family on 09/23/17     FL2 transmitted to all facilities within larger geographic area on       Patient informed that his/her managed care company has contracts with or will negotiate with certain facilities, including the following:  [facility]     Yes   Patient/family informed of bed offers received.  Patient chooses bed at [facility]     Physician recommends and patient chooses bed at      Patient to be transferred to [facility] on 09/25/17.  Patient to be transferred to facility by [facility]     Patient family notified on 09/25/17 of transfer.  Name of family member notified:  [name], brother     PHYSICIAN Please prepare  priority discharge summary, including medications, Please prepare prescriptions, Please sign FL2     Additional Comment:    _______________________________________________ Estanislado Emms, LCSW 09/25/2017, 12:20 PM

## 2017-09-25 NOTE — Progress Notes (Addendum)
Patient will discharge to [facility]. Anticipated discharge date: 09/25/17 Family notified: Roney Youtz, brother Transportation by: [facility]  Nurse to call report to [phone]. Patient will go to room 3222 at the facility.   CSW signing off.  Estanislado Emms, [place]  Clinical Social Worker

## 2017-09-25 NOTE — Progress Notes (Signed)
CARDIAC REHAB PHASE I   PRE:  Rate/Rhythm: 85 SR  BP:  Supine:   Sitting: 141/88  Standing:    SaO2: 91%RA  MODE:  Ambulation: 300 ft   POST:  Rate/Rhythm: 91 SR  BP:  Supine:   Sitting: 141/74  Standing:    SaO2: 100%RA 1145-1200 Pt walked 300 ft on RA with rolling walker and stopped once to rest. C/o rib pain but denied CP. Sat down quickly on bed when returning to room. Legs tired.   Graylon Good, RN BSN  09/25/2017 11:58 AM

## 2017-09-25 NOTE — Care Management Important Message (Signed)
Important Message  Patient Details  Name: Tommy Mclaughlin MRN: [id number] Date of Birth: 1966/01/31   Medicare Important Message Given:  Yes    Erenest Rasher, RN 09/25/2017, 12:14 PM

## 2017-09-25 NOTE — Progress Notes (Signed)
Bladder scanned and result received of >200. Pt. Straight cath because of calculated input. Sterile technique performed. Second person in room. Procedure performed per hospital policy. 900cc of clear, yellow urine received. Pt. Tolerated procedure well. Dressings on BLE changed.

## 2017-09-25 NOTE — Progress Notes (Signed)
Report given to [facility] Nurse Quita Skye.

## 2017-09-25 NOTE — Discharge Summary (Signed)
NAMEMONTAGUE, CORELLA MEDICAL RECORD HQ:[id number] ACCOUNT [id number] DATE OF BIRTH:November 02, 1965 FACILITY: [facility] LOCATION: [facility] PHYSICIAN:Sarin Comunale Daivd Council, MD  DISCHARGE SUMMARY  DATE OF DISCHARGE:  09/25/2017  ADMITTING DIAGNOSES: 1.  Acute inferolateral wall myocardial infarction with late presentation. 2.  Hypertensive heart disease with systolic dysfunction. 3.  Decompensated systolic congestive heart failure. 4.  Insulin-requiring diabetes mellitus. 5.  History of right cerebrovascular accident with left paresis. 6.  Tobacco abuse. 7.  Chronic kidney disease, stage IV. 8.  Anemia of chronic disease. 9.  Peripheral vascular disease. 10.  Family history of coronary artery disease.  FINAL DIAGNOSES: 1.  Status post acute inferolateral wall myocardial infarction, status post percutaneous coronary intervention to obtuse marginal 2. 2.  Status post sustained ventricular tachycardia cardiac arrest requiring relook angiogram and percutaneous transluminal coronary angioplasty stenting to mid and distal left anterior descending with excellent angiographic results with intraaortic  balloon pump support. 3.  Ischemic cardiomyopathy. 4.  Compensated systolic congestive heart failure. 5.  Status post acute on chronic kidney disease secondary to contrast. 6.  Status post metabolic acidosis. 7.  Compensated systolic congestive heart failure. 8.  Type 2 diabetes mellitus. 9.  History of right cerebrovascular accident in the past. 10.  History of tobacco abuse. 11.  Anemia of chronic disease. 12.  Peripheral vascular disease with superficial skin ulcers. 13.  Resolving urinary tract infection. 29.  Acute urinary retention, possible benign hypertrophy of prostate.  Now has indwelling Foley catheter, being followed by Urology. 34.  Positive family history of coronary artery disease.  DISCHARGE HOME MEDICATIONS: 1.  Amiodarone 200 mg 1 tablet daily. 2.  Aspirin 81 mg daily. 3.   Ciprofloxacin 250 mg twice daily for 1 week. 4.  Digoxin 62.5 mcg daily. 5.  Glimepiride 1 mg daily. 6.  BiDil 1 tablet twice daily. 7.  Nitrostat 0.4 mg, use as directed. 8.  MiraLax as needed. 9.  Flomax 0.4 mg daily. 10.  Brilinta 90 mg 1 tablet twice daily. 11.  Atorvastatin 40 mg daily. 12.  Bacitracin ointment, apply locally. 13.  Pepcid 10 mg daily. 14.  Lasix 40 mg daily. 15.  Nicotine patch 14 mg per 24 hours daily. 16.  Tramadol 50 mg every 12 hours as needed for moderate pain. 17.  Carvedilol 6.25 mg 1 tablet twice daily.  DIET:  Low-salt, low-cholesterol, 1800-calorie ADA/renal diet.  Monitor blood sugar and blood pressure regularly and chart.  The patient will be discharged with indwelling Foley catheter.  Will be followed up by urology in 1 week.  Follow up with me in 1  week.  Follow up with EP as scheduled.  Follow up with urology.  Follow up with renal service as scheduled.  Follow up with PMD as scheduled.  CONDITION AT DISCHARGE:  Stable.  Patient will be discharged to skilled nursing facility.  BRIEF HISTORY:  The patient is a 52 year old male with past medical history significant for hypertensive heart disease with systolic dysfunction, history of congestive heart failure secondary to depressed LV systolic function, EF approximately 30% to 35%  in May of 2019, insulin-requiring diabetes mellitus, history of right CVA with left paresis, chronic kidney disease stage IV, anemia of chronic disease, tobacco abuse 40+ pack years, quit approximately 1 year ago.  Positive family history of coronary  artery disease.  Mother died of MI in her 47s.  He came to the ER initially complaining of leg pain associated with progressive shortness of breath for the last few weeks and leg swelling.  Upon further questioning, the patient complained of retrosternal  chest pain off and on since this morning and did not seek any medical attention.  EKG done later in the afternoon in the ED showed  normal sinus rhythm with left atrial enlargement and septal Q-waves and ST elevation in inferolateral leads.  Code STEMI  was called.  The patient was emergently brought to the cath lab for emergency PCI.  The patient states that his chest pain was grade 7/10, earlier in the morning 2/10.  Denies any nausea, vomiting, diaphoresis.  Denies PND, orthopnea, but complains of  progressive leg swelling despite taking Lasix.  PHYSICAL EXAMINATION: GENERAL:  He was alert, awake, oriented. VITAL SIGNS:  Blood pressure was 139/92, pulse 88.  He was afebrile. HEENT:  Conjunctivae are pink. NECK:  Supple, no JVD, no bruit. LUNGS:  Decreased breath sound at bases, clear anteriorly. CARDIOVASCULAR:  S1, S2 was normal.  There was soft systolic murmur and an S3 gallop. ABDOMEN:  Soft.  Bowel sounds present, nontender. EXTREMITIES:  There is no clubbing, cyanosis.  There was 2+ edema noted and superficial ulcers over the knees, and a large blister on the right foot was noted.  LABORATORY DATA:  Admission labs:  His sodium was 139, potassium 3.2, BUN 30, creatinine 2.65.  His BNP was above 4500.  Troponin I was elevated at 0.83.  Hemoglobin was 8.4, hematocrit 27.5, white count of 10.5.  His hemoglobin A1c was 8.8.  Repeat  troponin I was 6.24, 5.46, 6.24, 4.56, 5.28.  Last troponin is trending down to 2.36.  After PCI, his creatinine peaked to 6.15.  It is trending down.  Last creatinine is 2.53, which is at the baseline.  The last hemoglobin is 8.3, hematocrit 27.3, white  count of 8.8.  BRIEF HOSPITAL COURSE:  The patient was emergently brought to the cath lab and underwent emergency left cardiac catheterization and PTCA stenting to the culprit obtuse marginal vessel.  This was a very small vessel.  Distally, it has diffuse disease,  requiring 2 long drug-eluting stents with good angiographic results and TIMI-3 distal flow.  The patient was transferred to CCU in stable condition.  Renal service consultation was  obtained as the patient had worsening renal function.  The patient did  not require any hemodialysis during the hospital stay.  Gradually, his renal function improved.  On 07/02, the patient had an episode of sustained ventricular tachycardia requiring CPR and relook angiogram.  The patient was noted to have patent stents  with TIMI-3 distal flow and obtuse marginal stents placed but had critical focal distal and a long mid-LAD stenosis, which was successfully stented with intraaortic balloon pump support.  The patient tolerated the procedure well.  The patient did not  have any further episodes of V-tach during the hospital stay.  EP consultation was called.  The patient was felt not a candidate for emergency ICD in view of possible ischemia-related V-tach due to critical lesion in LAD.  A LifeVest has been arranged.   The patient also during the hospital stay had multiple episodes of acute urinary retention, requiring in and out catheter and then followed by indwelling Foley catheter.  The patient was started on Flomax approximately a week ago.  Yesterday, again, the  Foley catheter was taken out, but the patient was unable to void, requiring again in and out catheter.  Urology consultation was called and advised to have indwelling Foley catheter and can be discharged to skilled nursing facility with indwelling  Foley  catheter in place.  The patient will have again bladder trial as outpatient per urology.  The patient will be also discharged on LifeVest.  We will repeat his 2D echo in 2-3 months.  If his EF remains below 35%, will require ICD.  The patient will be  followed up by EP as outpatient as well.  We will start him on an ARB once his renal function improves as outpatient and uptitrate the beta blockers as tolerated.  LN/NUANCE D:09/25/2017 T:09/25/2017 JOB:[id number]

## 2017-09-25 NOTE — Progress Notes (Signed)
Bladder scanned and >279ml received. Pt. encouraged to stand up to void. Pt. states he can feel pressure but it does not hurt like yesterday afternoon.

## 2017-09-26 ENCOUNTER — Telehealth (HOSPITAL_COMMUNITY): Payer: Self-pay

## 2017-09-26 DIAGNOSIS — R338 Other retention of urine: Secondary | ICD-10-CM | POA: Diagnosis not present

## 2017-09-26 DIAGNOSIS — I5023 Acute on chronic systolic (congestive) heart failure: Secondary | ICD-10-CM | POA: Diagnosis not present

## 2017-09-26 DIAGNOSIS — I255 Ischemic cardiomyopathy: Secondary | ICD-10-CM | POA: Diagnosis not present

## 2017-09-26 DIAGNOSIS — I2119 ST elevation (STEMI) myocardial infarction involving other coronary artery of inferior wall: Secondary | ICD-10-CM | POA: Diagnosis not present

## 2017-09-26 NOTE — Telephone Encounter (Signed)
Patients insurance is active and benefits verified through Medicare A/B - No co-pay, deductible amount of $185.00/$185.00 has been met, no out of pocket, 20% co-insurance, and no pre-authorization is required. Passport/reference [phone]  Will contact patient to see if he is interested in the Cardiac Rehab Program. If interested, patient will be contacted for scheduling upon review by the RN Navigator.

## 2017-09-27 ENCOUNTER — Telehealth (HOSPITAL_COMMUNITY): Payer: Self-pay

## 2017-09-27 ENCOUNTER — Emergency Department (HOSPITAL_COMMUNITY): Payer: Medicare Other

## 2017-09-27 ENCOUNTER — Ambulatory Visit: Payer: Medicare Other | Admitting: Podiatry

## 2017-09-27 ENCOUNTER — Emergency Department (HOSPITAL_COMMUNITY)
Admission: EM | Admit: 2017-09-27 | Discharge: 2017-09-27 | Disposition: A | Discharge status: Home / Self Care | Payer: Medicare Other | Attending: Emergency Medicine | Admitting: Emergency Medicine

## 2017-09-27 ENCOUNTER — Encounter (HOSPITAL_COMMUNITY): Payer: Self-pay | Admitting: Emergency Medicine

## 2017-09-27 DIAGNOSIS — Z7982 Long term (current) use of aspirin: Secondary | ICD-10-CM | POA: Diagnosis not present

## 2017-09-27 DIAGNOSIS — Z87891 Personal history of nicotine dependence: Secondary | ICD-10-CM | POA: Insufficient documentation

## 2017-09-27 DIAGNOSIS — R072 Precordial pain: Secondary | ICD-10-CM | POA: Diagnosis not present

## 2017-09-27 DIAGNOSIS — Z7984 Long term (current) use of oral hypoglycemic drugs: Secondary | ICD-10-CM | POA: Insufficient documentation

## 2017-09-27 DIAGNOSIS — I130 Hypertensive heart and chronic kidney disease with heart failure and stage 1 through stage 4 chronic kidney disease, or unspecified chronic kidney disease: Secondary | ICD-10-CM | POA: Insufficient documentation

## 2017-09-27 DIAGNOSIS — I110 Hypertensive heart disease with heart failure: Secondary | ICD-10-CM | POA: Diagnosis not present

## 2017-09-27 DIAGNOSIS — N184 Chronic kidney disease, stage 4 (severe): Secondary | ICD-10-CM | POA: Insufficient documentation

## 2017-09-27 DIAGNOSIS — E119 Type 2 diabetes mellitus without complications: Secondary | ICD-10-CM | POA: Insufficient documentation

## 2017-09-27 DIAGNOSIS — I5043 Acute on chronic combined systolic (congestive) and diastolic (congestive) heart failure: Secondary | ICD-10-CM | POA: Insufficient documentation

## 2017-09-27 DIAGNOSIS — R600 Localized edema: Secondary | ICD-10-CM | POA: Diagnosis not present

## 2017-09-27 DIAGNOSIS — R079 Chest pain, unspecified: Secondary | ICD-10-CM | POA: Diagnosis not present

## 2017-09-27 DIAGNOSIS — Z79899 Other long term (current) drug therapy: Secondary | ICD-10-CM | POA: Insufficient documentation

## 2017-09-27 DIAGNOSIS — I2119 ST elevation (STEMI) myocardial infarction involving other coronary artery of inferior wall: Secondary | ICD-10-CM | POA: Diagnosis not present

## 2017-09-27 DIAGNOSIS — I2510 Atherosclerotic heart disease of native coronary artery without angina pectoris: Secondary | ICD-10-CM | POA: Diagnosis not present

## 2017-09-27 DIAGNOSIS — I255 Ischemic cardiomyopathy: Secondary | ICD-10-CM | POA: Diagnosis not present

## 2017-09-27 LAB — I-STAT TROPONIN, ED
Troponin i, poc: 0.24 ng/mL (ref 0.00–0.08)
Troponin i, poc: 0.25 ng/mL (ref 0.00–0.08)

## 2017-09-27 LAB — CBC
HCT: 28.9 % — ABNORMAL LOW (ref 39.0–52.0)
Hemoglobin: 8.7 g/dL — ABNORMAL LOW (ref 13.0–17.0)
MCH: 26.1 pg (ref 26.0–34.0)
MCHC: 30.1 g/dL (ref 30.0–36.0)
MCV: 86.8 fL (ref 78.0–100.0)
Platelets: 390 10*3/uL (ref 150–400)
RBC: 3.33 MIL/uL — ABNORMAL LOW (ref 4.22–5.81)
RDW: 17.2 % — ABNORMAL HIGH (ref 11.5–15.5)
WBC: 10.1 10*3/uL (ref 4.0–10.5)

## 2017-09-27 LAB — BASIC METABOLIC PANEL
Anion gap: 7 (ref 5–15)
BUN: 22 mg/dL — ABNORMAL HIGH (ref 6–20)
CO2: 26 mmol/L (ref 22–32)
Calcium: 8.4 mg/dL — ABNORMAL LOW (ref 8.9–10.3)
Chloride: 107 mmol/L (ref 98–111)
Creatinine, Ser: 2.13 mg/dL — ABNORMAL HIGH (ref 0.61–1.24)
GFR calc Af Amer: 39 mL/min — ABNORMAL LOW (ref >60)
GFR calc non Af Amer: 34 mL/min — ABNORMAL LOW (ref >60)
Glucose, Bld: 204 mg/dL — ABNORMAL HIGH (ref 70–99)
Potassium: 4.4 mmol/L (ref 3.5–5.1)
Sodium: 140 mmol/L (ref 135–145)

## 2017-09-27 MED ORDER — FENTANYL CITRATE (PF) 100 MCG/2ML IJ SOLN
75.0000 ug | Freq: Once | INTRAMUSCULAR | Status: AC
Start: 2017-09-27 — End: 2017-09-27
  Administered 2017-09-27: 75 ug via INTRAVENOUS
  Filled 2017-09-27: qty 2

## 2017-09-27 NOTE — ED Notes (Signed)
EDP notified critical istat trop value.

## 2017-09-27 NOTE — ED Notes (Signed)
Patient transported to X-ray 

## 2017-09-27 NOTE — ED Triage Notes (Signed)
Per [facility] pt coming from [facility] c/o chest pain onset of yesterday. Staff assessed patient and gave cough medicine. When patient woke this morning still with chest pain, nausea and pain in left arm. 324 aspirin and 2 nitro given en route with no relief. Patient was discharged 2 days ago after admitted and having 2 stents placed. Pt has life vest in place due to cardiac arrest previously.

## 2017-09-27 NOTE — ED Notes (Signed)
[facility]AR at bedside to transport patient back to facility.
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
## 2017-09-27 NOTE — Discharge Instructions (Signed)
Return here as needed.  Follow-up with your cardiologist.

## 2017-09-27 NOTE — Telephone Encounter (Signed)
Attempted to contact patient to see if he is interested in the Cardiac Rehab Program - lm on vm °

## 2017-09-30 ENCOUNTER — Encounter: Payer: Medicare Other | Admitting: Internal Medicine

## 2017-09-30 ENCOUNTER — Telehealth: Payer: Self-pay | Admitting: Podiatry

## 2017-09-30 DIAGNOSIS — R338 Other retention of urine: Secondary | ICD-10-CM | POA: Diagnosis not present

## 2017-09-30 DIAGNOSIS — I2119 ST elevation (STEMI) myocardial infarction involving other coronary artery of inferior wall: Secondary | ICD-10-CM | POA: Diagnosis not present

## 2017-09-30 DIAGNOSIS — I462 Cardiac arrest due to underlying cardiac condition: Secondary | ICD-10-CM | POA: Diagnosis not present

## 2017-09-30 DIAGNOSIS — I5023 Acute on chronic systolic (congestive) heart failure: Secondary | ICD-10-CM | POA: Diagnosis not present

## 2017-09-30 NOTE — Telephone Encounter (Signed)
Pt left message stating that Tim Milton(family member) was going to pick up pts diabetic shoes. Pt has a heart attack and is in rehab.   I called Tim and told him we could not dispense shoes to him due to medicare guidelines. He asked me to talk to pt.  I left message for pt that medicare guidelines will not allow his family member to pick up the shoes that pt has to come in and we have to make sure shoes fit properly. He could call when he is released from rehab and we can schedule pt to be seen, and if he had further questions to call me back. His auth expires on 9.19.2019.Marland KitchenMarland Kitchen

## 2017-10-02 DIAGNOSIS — N401 Enlarged prostate with lower urinary tract symptoms: Secondary | ICD-10-CM | POA: Diagnosis not present

## 2017-10-02 DIAGNOSIS — S91302A Unspecified open wound, left foot, initial encounter: Secondary | ICD-10-CM | POA: Diagnosis not present

## 2017-10-02 DIAGNOSIS — S91301A Unspecified open wound, right foot, initial encounter: Secondary | ICD-10-CM | POA: Diagnosis not present

## 2017-10-02 DIAGNOSIS — R338 Other retention of urine: Secondary | ICD-10-CM | POA: Diagnosis not present

## 2017-10-02 NOTE — Telephone Encounter (Signed)
Also Medicare will not pay for the DBS if they are dispensed while patient is in Medicare Part A rehab.

## 2017-10-03 DIAGNOSIS — I110 Hypertensive heart disease with heart failure: Secondary | ICD-10-CM | POA: Diagnosis not present

## 2017-10-03 DIAGNOSIS — I5023 Acute on chronic systolic (congestive) heart failure: Secondary | ICD-10-CM | POA: Diagnosis not present

## 2017-10-03 DIAGNOSIS — I2510 Atherosclerotic heart disease of native coronary artery without angina pectoris: Secondary | ICD-10-CM | POA: Diagnosis not present

## 2017-10-03 DIAGNOSIS — R338 Other retention of urine: Secondary | ICD-10-CM | POA: Diagnosis not present

## 2017-10-04 DIAGNOSIS — I739 Peripheral vascular disease, unspecified: Secondary | ICD-10-CM | POA: Diagnosis not present

## 2017-10-04 DIAGNOSIS — I5020 Unspecified systolic (congestive) heart failure: Secondary | ICD-10-CM | POA: Diagnosis not present

## 2017-10-04 DIAGNOSIS — F17290 Nicotine dependence, other tobacco product, uncomplicated: Secondary | ICD-10-CM | POA: Diagnosis not present

## 2017-10-04 DIAGNOSIS — Z716 Tobacco abuse counseling: Secondary | ICD-10-CM | POA: Diagnosis not present

## 2017-10-04 DIAGNOSIS — I639 Cerebral infarction, unspecified: Secondary | ICD-10-CM | POA: Diagnosis not present

## 2017-10-04 DIAGNOSIS — I252 Old myocardial infarction: Secondary | ICD-10-CM | POA: Diagnosis not present

## 2017-10-04 DIAGNOSIS — E785 Hyperlipidemia, unspecified: Secondary | ICD-10-CM | POA: Diagnosis not present

## 2017-10-04 DIAGNOSIS — I255 Ischemic cardiomyopathy: Secondary | ICD-10-CM | POA: Diagnosis not present

## 2017-10-04 DIAGNOSIS — D649 Anemia, unspecified: Secondary | ICD-10-CM | POA: Diagnosis not present

## 2017-10-04 DIAGNOSIS — I10 Essential (primary) hypertension: Secondary | ICD-10-CM | POA: Diagnosis not present

## 2017-10-04 DIAGNOSIS — I2510 Atherosclerotic heart disease of native coronary artery without angina pectoris: Secondary | ICD-10-CM | POA: Diagnosis not present

## 2017-10-04 DIAGNOSIS — N189 Chronic kidney disease, unspecified: Secondary | ICD-10-CM | POA: Diagnosis not present

## 2017-10-07 ENCOUNTER — Telehealth (HOSPITAL_COMMUNITY): Payer: Self-pay

## 2017-10-07 NOTE — Telephone Encounter (Signed)
Called patient to see if he was interested in participating in the [place]. Patient stated he is moving back to [place] and he leaves the rehab facility.   Closed referral

## 2017-10-08 DIAGNOSIS — I5023 Acute on chronic systolic (congestive) heart failure: Secondary | ICD-10-CM | POA: Diagnosis not present

## 2017-10-08 DIAGNOSIS — I2510 Atherosclerotic heart disease of native coronary artery without angina pectoris: Secondary | ICD-10-CM | POA: Diagnosis not present

## 2017-10-08 DIAGNOSIS — I255 Ischemic cardiomyopathy: Secondary | ICD-10-CM | POA: Diagnosis not present

## 2017-10-08 DIAGNOSIS — I110 Hypertensive heart disease with heart failure: Secondary | ICD-10-CM | POA: Diagnosis not present

## 2017-10-09 DIAGNOSIS — S91301D Unspecified open wound, right foot, subsequent encounter: Secondary | ICD-10-CM | POA: Diagnosis not present

## 2017-10-09 DIAGNOSIS — S91302D Unspecified open wound, left foot, subsequent encounter: Secondary | ICD-10-CM | POA: Diagnosis not present

## 2017-10-10 MED FILL — AMIODARONE HCL 200 MG TAB: 200 | 30 days supply | Qty: 30 | Fill #0

## 2017-10-10 MED FILL — GLIMEPIRIDE 1 MG TABLET: 1 | 30 days supply | Qty: 30 | Fill #0

## 2017-10-10 MED FILL — NITROGLYCERIN 0.4 MG TAB SL: 0.4 | 20 days supply | Qty: 25 | Fill #0

## 2017-10-10 MED FILL — LANOXIN 62.5 MCG TABS: 62.5 | 30 days supply | Qty: 30 | Fill #0

## 2017-10-10 MED FILL — CARVEDILOL 6.25 MG TABLET: 6.25 | 30 days supply | Qty: 60 | Fill #0

## 2017-10-10 MED FILL — CIPROFLOXACIN HCL 250 MG TA: 250 | 7 days supply | Qty: 14 | Fill #0

## 2017-10-10 MED FILL — BIDIL TABLET: 20-37.5 | 30 days supply | Qty: 60 | Fill #0

## 2017-10-10 MED FILL — BRILINTA 90 MG TABLET: 90 | 30 days supply | Qty: 60 | Fill #0

## 2017-10-10 MED FILL — TAMSULOSIN HCL 0.4 MG CAP: 0.4 | 30 days supply | Qty: 30 | Fill #0

## 2017-10-14 DIAGNOSIS — Z713 Dietary counseling and surveillance: Secondary | ICD-10-CM | POA: Diagnosis not present

## 2017-10-14 DIAGNOSIS — I129 Hypertensive chronic kidney disease with stage 1 through stage 4 chronic kidney disease, or unspecified chronic kidney disease: Secondary | ICD-10-CM | POA: Diagnosis not present

## 2017-10-14 DIAGNOSIS — E1165 Type 2 diabetes mellitus with hyperglycemia: Secondary | ICD-10-CM | POA: Diagnosis not present

## 2017-10-14 DIAGNOSIS — E1122 Type 2 diabetes mellitus with diabetic chronic kidney disease: Secondary | ICD-10-CM | POA: Diagnosis not present

## 2017-10-14 DIAGNOSIS — Z719 Counseling, unspecified: Secondary | ICD-10-CM | POA: Diagnosis not present

## 2017-10-14 DIAGNOSIS — R81 Glycosuria: Secondary | ICD-10-CM | POA: Diagnosis not present

## 2017-10-14 DIAGNOSIS — E11622 Type 2 diabetes mellitus with other skin ulcer: Secondary | ICD-10-CM | POA: Diagnosis not present

## 2017-10-14 DIAGNOSIS — I517 Cardiomegaly: Secondary | ICD-10-CM | POA: Diagnosis not present

## 2017-10-14 DIAGNOSIS — I519 Heart disease, unspecified: Secondary | ICD-10-CM | POA: Diagnosis not present

## 2017-10-14 NOTE — Telephone Encounter (Signed)
Pt called back and had to move back to [facility] because he has no one in [place] area to help him since having the heart attack and asked if we could ship them to him. He said last time he got shoes they were to big and rubbing on his feet.    I explained that we could not ship the shoes he has to have an appt  in our office for insurance to pay for the shoes. He thought the shoes were paid for and I explained that we do not bill for shoes until pt has been seen and picked them up. I told him he maybe better off just seeing a podiatrist where he is currently living.

## 2017-10-21 DIAGNOSIS — I208 Other forms of angina pectoris: Secondary | ICD-10-CM | POA: Diagnosis not present

## 2017-10-21 DIAGNOSIS — I10 Essential (primary) hypertension: Secondary | ICD-10-CM | POA: Diagnosis not present

## 2017-10-21 DIAGNOSIS — I739 Peripheral vascular disease, unspecified: Secondary | ICD-10-CM | POA: Diagnosis not present

## 2017-10-21 DIAGNOSIS — E7800 Pure hypercholesterolemia, unspecified: Secondary | ICD-10-CM | POA: Diagnosis not present

## 2017-10-21 DIAGNOSIS — I209 Angina pectoris, unspecified: Secondary | ICD-10-CM | POA: Diagnosis not present

## 2017-10-21 DIAGNOSIS — R0602 Shortness of breath: Secondary | ICD-10-CM | POA: Diagnosis not present

## 2017-10-25 DIAGNOSIS — I110 Hypertensive heart disease with heart failure: Secondary | ICD-10-CM | POA: Diagnosis not present

## 2017-10-25 DIAGNOSIS — Z09 Encounter for follow-up examination after completed treatment for conditions other than malignant neoplasm: Secondary | ICD-10-CM | POA: Diagnosis not present

## 2017-10-25 DIAGNOSIS — I519 Heart disease, unspecified: Secondary | ICD-10-CM | POA: Diagnosis not present

## 2017-10-25 DIAGNOSIS — E785 Hyperlipidemia, unspecified: Secondary | ICD-10-CM | POA: Diagnosis not present

## 2017-10-25 DIAGNOSIS — Z719 Counseling, unspecified: Secondary | ICD-10-CM | POA: Diagnosis not present

## 2017-10-25 DIAGNOSIS — Z0131 Encounter for examination of blood pressure with abnormal findings: Secondary | ICD-10-CM | POA: Diagnosis not present

## 2017-10-25 DIAGNOSIS — Z760 Encounter for issue of repeat prescription: Secondary | ICD-10-CM | POA: Diagnosis not present

## 2017-10-25 DIAGNOSIS — Z713 Dietary counseling and surveillance: Secondary | ICD-10-CM | POA: Diagnosis not present

## 2017-10-25 DIAGNOSIS — R945 Abnormal results of liver function studies: Secondary | ICD-10-CM | POA: Diagnosis not present

## 2017-10-25 DIAGNOSIS — R946 Abnormal results of thyroid function studies: Secondary | ICD-10-CM | POA: Diagnosis not present

## 2017-10-25 DIAGNOSIS — E1165 Type 2 diabetes mellitus with hyperglycemia: Secondary | ICD-10-CM | POA: Diagnosis not present

## 2017-10-25 DIAGNOSIS — R6889 Other general symptoms and signs: Secondary | ICD-10-CM | POA: Diagnosis not present

## 2017-10-25 DIAGNOSIS — E119 Type 2 diabetes mellitus without complications: Secondary | ICD-10-CM | POA: Diagnosis not present

## 2017-10-25 DIAGNOSIS — R7309 Other abnormal glucose: Secondary | ICD-10-CM | POA: Diagnosis not present

## 2017-10-28 ENCOUNTER — Encounter: Payer: Self-pay | Admitting: Internal Medicine

## 2017-10-28 ENCOUNTER — Encounter: Payer: Medicare Other | Admitting: Internal Medicine

## 2017-10-28 DIAGNOSIS — M79606 Pain in leg, unspecified: Secondary | ICD-10-CM | POA: Diagnosis not present

## 2017-10-28 DIAGNOSIS — R609 Edema, unspecified: Secondary | ICD-10-CM | POA: Diagnosis not present

## 2017-10-28 DIAGNOSIS — I739 Peripheral vascular disease, unspecified: Secondary | ICD-10-CM | POA: Diagnosis not present

## 2017-10-28 DIAGNOSIS — R9431 Abnormal electrocardiogram [ECG] [EKG]: Secondary | ICD-10-CM | POA: Diagnosis not present

## 2017-10-28 DIAGNOSIS — I209 Angina pectoris, unspecified: Secondary | ICD-10-CM | POA: Diagnosis not present

## 2017-10-29 DIAGNOSIS — L853 Xerosis cutis: Secondary | ICD-10-CM | POA: Diagnosis not present

## 2017-10-29 DIAGNOSIS — E1149 Type 2 diabetes mellitus with other diabetic neurological complication: Secondary | ICD-10-CM | POA: Diagnosis not present

## 2017-10-29 DIAGNOSIS — B351 Tinea unguium: Secondary | ICD-10-CM | POA: Diagnosis not present

## 2017-11-04 DIAGNOSIS — I739 Peripheral vascular disease, unspecified: Secondary | ICD-10-CM | POA: Diagnosis not present

## 2017-11-04 DIAGNOSIS — I208 Other forms of angina pectoris: Secondary | ICD-10-CM | POA: Diagnosis not present

## 2017-11-04 DIAGNOSIS — R0602 Shortness of breath: Secondary | ICD-10-CM | POA: Diagnosis not present

## 2017-11-04 DIAGNOSIS — I209 Angina pectoris, unspecified: Secondary | ICD-10-CM | POA: Diagnosis not present

## 2017-11-06 DIAGNOSIS — E7800 Pure hypercholesterolemia, unspecified: Secondary | ICD-10-CM | POA: Diagnosis not present

## 2017-11-06 DIAGNOSIS — I2510 Atherosclerotic heart disease of native coronary artery without angina pectoris: Secondary | ICD-10-CM | POA: Diagnosis not present

## 2017-11-06 DIAGNOSIS — I739 Peripheral vascular disease, unspecified: Secondary | ICD-10-CM | POA: Diagnosis not present

## 2017-11-06 DIAGNOSIS — I209 Angina pectoris, unspecified: Secondary | ICD-10-CM | POA: Diagnosis not present

## 2017-11-06 DIAGNOSIS — I25118 Atherosclerotic heart disease of native coronary artery with other forms of angina pectoris: Secondary | ICD-10-CM | POA: Diagnosis not present

## 2017-11-06 DIAGNOSIS — I10 Essential (primary) hypertension: Secondary | ICD-10-CM | POA: Diagnosis not present

## 2017-11-11 DIAGNOSIS — Z760 Encounter for issue of repeat prescription: Secondary | ICD-10-CM | POA: Diagnosis not present

## 2017-11-11 DIAGNOSIS — E7849 Other hyperlipidemia: Secondary | ICD-10-CM | POA: Diagnosis not present

## 2017-11-11 DIAGNOSIS — I110 Hypertensive heart disease with heart failure: Secondary | ICD-10-CM | POA: Diagnosis not present

## 2017-11-11 DIAGNOSIS — R609 Edema, unspecified: Secondary | ICD-10-CM | POA: Diagnosis not present

## 2017-11-11 DIAGNOSIS — E7800 Pure hypercholesterolemia, unspecified: Secondary | ICD-10-CM | POA: Diagnosis not present

## 2017-11-11 DIAGNOSIS — I10 Essential (primary) hypertension: Secondary | ICD-10-CM | POA: Diagnosis not present

## 2017-11-11 DIAGNOSIS — E1122 Type 2 diabetes mellitus with diabetic chronic kidney disease: Secondary | ICD-10-CM | POA: Diagnosis not present

## 2017-11-11 DIAGNOSIS — I739 Peripheral vascular disease, unspecified: Secondary | ICD-10-CM | POA: Diagnosis not present

## 2017-11-11 DIAGNOSIS — N529 Male erectile dysfunction, unspecified: Secondary | ICD-10-CM | POA: Diagnosis not present

## 2017-11-11 DIAGNOSIS — E1165 Type 2 diabetes mellitus with hyperglycemia: Secondary | ICD-10-CM | POA: Diagnosis not present

## 2017-11-11 DIAGNOSIS — R3981 Functional urinary incontinence: Secondary | ICD-10-CM | POA: Diagnosis not present

## 2017-11-11 DIAGNOSIS — I129 Hypertensive chronic kidney disease with stage 1 through stage 4 chronic kidney disease, or unspecified chronic kidney disease: Secondary | ICD-10-CM | POA: Diagnosis not present

## 2017-11-20 DIAGNOSIS — E1165 Type 2 diabetes mellitus with hyperglycemia: Secondary | ICD-10-CM | POA: Diagnosis not present

## 2017-11-20 DIAGNOSIS — E782 Mixed hyperlipidemia: Secondary | ICD-10-CM | POA: Diagnosis not present

## 2017-11-20 DIAGNOSIS — I10 Essential (primary) hypertension: Secondary | ICD-10-CM | POA: Diagnosis not present

## 2017-11-20 DIAGNOSIS — I213 ST elevation (STEMI) myocardial infarction of unspecified site: Secondary | ICD-10-CM | POA: Diagnosis not present

## 2017-11-25 DIAGNOSIS — L89159 Pressure ulcer of sacral region, unspecified stage: Secondary | ICD-10-CM | POA: Diagnosis not present

## 2017-11-25 DIAGNOSIS — Z719 Counseling, unspecified: Secondary | ICD-10-CM | POA: Diagnosis not present

## 2017-11-25 DIAGNOSIS — S134XXA Sprain of ligaments of cervical spine, initial encounter: Secondary | ICD-10-CM | POA: Diagnosis not present

## 2017-11-25 DIAGNOSIS — E11621 Type 2 diabetes mellitus with foot ulcer: Secondary | ICD-10-CM | POA: Diagnosis not present

## 2017-11-25 DIAGNOSIS — M545 Low back pain: Secondary | ICD-10-CM | POA: Diagnosis not present

## 2017-11-27 DIAGNOSIS — S134XXA Sprain of ligaments of cervical spine, initial encounter: Secondary | ICD-10-CM | POA: Diagnosis not present

## 2017-11-27 DIAGNOSIS — M545 Low back pain: Secondary | ICD-10-CM | POA: Diagnosis not present

## 2017-11-29 DIAGNOSIS — S134XXA Sprain of ligaments of cervical spine, initial encounter: Secondary | ICD-10-CM | POA: Diagnosis not present

## 2017-11-29 DIAGNOSIS — M545 Low back pain: Secondary | ICD-10-CM | POA: Diagnosis not present

## 2017-12-02 DIAGNOSIS — M545 Low back pain: Secondary | ICD-10-CM | POA: Diagnosis not present

## 2017-12-02 DIAGNOSIS — S134XXA Sprain of ligaments of cervical spine, initial encounter: Secondary | ICD-10-CM | POA: Diagnosis not present

## 2017-12-04 DIAGNOSIS — M545 Low back pain: Secondary | ICD-10-CM | POA: Diagnosis not present

## 2017-12-04 DIAGNOSIS — S134XXA Sprain of ligaments of cervical spine, initial encounter: Secondary | ICD-10-CM | POA: Diagnosis not present

## 2017-12-06 DIAGNOSIS — S134XXA Sprain of ligaments of cervical spine, initial encounter: Secondary | ICD-10-CM | POA: Diagnosis not present

## 2017-12-06 DIAGNOSIS — M545 Low back pain: Secondary | ICD-10-CM | POA: Diagnosis not present

## 2017-12-09 DIAGNOSIS — N189 Chronic kidney disease, unspecified: Secondary | ICD-10-CM | POA: Diagnosis not present

## 2017-12-09 DIAGNOSIS — I429 Cardiomyopathy, unspecified: Secondary | ICD-10-CM | POA: Diagnosis present

## 2017-12-09 DIAGNOSIS — S90812A Abrasion, left foot, initial encounter: Secondary | ICD-10-CM | POA: Diagnosis present

## 2017-12-09 DIAGNOSIS — Z7902 Long term (current) use of antithrombotics/antiplatelets: Secondary | ICD-10-CM | POA: Diagnosis not present

## 2017-12-09 DIAGNOSIS — I70203 Unspecified atherosclerosis of native arteries of extremities, bilateral legs: Secondary | ICD-10-CM | POA: Diagnosis present

## 2017-12-09 DIAGNOSIS — I130 Hypertensive heart and chronic kidney disease with heart failure and stage 1 through stage 4 chronic kidney disease, or unspecified chronic kidney disease: Secondary | ICD-10-CM | POA: Diagnosis present

## 2017-12-09 DIAGNOSIS — I501 Left ventricular failure: Secondary | ICD-10-CM | POA: Diagnosis not present

## 2017-12-09 DIAGNOSIS — I252 Old myocardial infarction: Secondary | ICD-10-CM | POA: Diagnosis not present

## 2017-12-09 DIAGNOSIS — E785 Hyperlipidemia, unspecified: Secondary | ICD-10-CM | POA: Diagnosis present

## 2017-12-09 DIAGNOSIS — I2510 Atherosclerotic heart disease of native coronary artery without angina pectoris: Secondary | ICD-10-CM | POA: Diagnosis present

## 2017-12-09 DIAGNOSIS — E1122 Type 2 diabetes mellitus with diabetic chronic kidney disease: Secondary | ICD-10-CM | POA: Diagnosis present

## 2017-12-09 DIAGNOSIS — N179 Acute kidney failure, unspecified: Secondary | ICD-10-CM | POA: Diagnosis present

## 2017-12-09 DIAGNOSIS — E1121 Type 2 diabetes mellitus with diabetic nephropathy: Secondary | ICD-10-CM | POA: Diagnosis present

## 2017-12-09 DIAGNOSIS — Z955 Presence of coronary angioplasty implant and graft: Secondary | ICD-10-CM | POA: Diagnosis not present

## 2017-12-09 DIAGNOSIS — R0602 Shortness of breath: Secondary | ICD-10-CM | POA: Diagnosis not present

## 2017-12-09 DIAGNOSIS — N184 Chronic kidney disease, stage 4 (severe): Secondary | ICD-10-CM | POA: Diagnosis present

## 2017-12-09 DIAGNOSIS — Z8673 Personal history of transient ischemic attack (TIA), and cerebral infarction without residual deficits: Secondary | ICD-10-CM | POA: Diagnosis not present

## 2017-12-09 DIAGNOSIS — K219 Gastro-esophageal reflux disease without esophagitis: Secondary | ICD-10-CM | POA: Diagnosis present

## 2017-12-09 DIAGNOSIS — Z7982 Long term (current) use of aspirin: Secondary | ICD-10-CM | POA: Diagnosis not present

## 2017-12-09 DIAGNOSIS — Z5309 Procedure and treatment not carried out because of other contraindication: Secondary | ICD-10-CM | POA: Diagnosis present

## 2017-12-09 DIAGNOSIS — E7800 Pure hypercholesterolemia, unspecified: Secondary | ICD-10-CM | POA: Diagnosis present

## 2017-12-09 DIAGNOSIS — F17210 Nicotine dependence, cigarettes, uncomplicated: Secondary | ICD-10-CM | POA: Diagnosis present

## 2017-12-09 DIAGNOSIS — I509 Heart failure, unspecified: Secondary | ICD-10-CM | POA: Diagnosis not present

## 2017-12-09 DIAGNOSIS — E1151 Type 2 diabetes mellitus with diabetic peripheral angiopathy without gangrene: Secondary | ICD-10-CM | POA: Diagnosis present

## 2017-12-09 DIAGNOSIS — I739 Peripheral vascular disease, unspecified: Secondary | ICD-10-CM | POA: Diagnosis not present

## 2017-12-09 DIAGNOSIS — Z7984 Long term (current) use of oral hypoglycemic drugs: Secondary | ICD-10-CM | POA: Diagnosis not present

## 2017-12-09 DIAGNOSIS — I5043 Acute on chronic combined systolic (congestive) and diastolic (congestive) heart failure: Secondary | ICD-10-CM | POA: Diagnosis present

## 2017-12-13 DIAGNOSIS — S134XXA Sprain of ligaments of cervical spine, initial encounter: Secondary | ICD-10-CM | POA: Diagnosis not present

## 2017-12-13 DIAGNOSIS — M545 Low back pain: Secondary | ICD-10-CM | POA: Diagnosis not present

## 2017-12-16 DIAGNOSIS — R011 Cardiac murmur, unspecified: Secondary | ICD-10-CM | POA: Diagnosis not present

## 2017-12-16 DIAGNOSIS — I25118 Atherosclerotic heart disease of native coronary artery with other forms of angina pectoris: Secondary | ICD-10-CM | POA: Diagnosis not present

## 2017-12-16 DIAGNOSIS — I739 Peripheral vascular disease, unspecified: Secondary | ICD-10-CM | POA: Diagnosis not present

## 2017-12-16 DIAGNOSIS — E1165 Type 2 diabetes mellitus with hyperglycemia: Secondary | ICD-10-CM | POA: Diagnosis not present

## 2017-12-16 DIAGNOSIS — R0602 Shortness of breath: Secondary | ICD-10-CM | POA: Diagnosis not present

## 2017-12-16 DIAGNOSIS — R609 Edema, unspecified: Secondary | ICD-10-CM | POA: Diagnosis not present

## 2017-12-16 DIAGNOSIS — E663 Overweight: Secondary | ICD-10-CM | POA: Diagnosis not present

## 2017-12-18 DIAGNOSIS — S134XXA Sprain of ligaments of cervical spine, initial encounter: Secondary | ICD-10-CM | POA: Diagnosis not present

## 2017-12-18 DIAGNOSIS — M545 Low back pain: Secondary | ICD-10-CM | POA: Diagnosis not present

## 2017-12-20 DIAGNOSIS — M545 Low back pain: Secondary | ICD-10-CM | POA: Diagnosis not present

## 2017-12-20 DIAGNOSIS — S134XXA Sprain of ligaments of cervical spine, initial encounter: Secondary | ICD-10-CM | POA: Diagnosis not present

## 2017-12-27 DIAGNOSIS — S134XXA Sprain of ligaments of cervical spine, initial encounter: Secondary | ICD-10-CM | POA: Diagnosis not present

## 2017-12-27 DIAGNOSIS — M545 Low back pain: Secondary | ICD-10-CM | POA: Diagnosis not present

## 2017-12-30 DIAGNOSIS — S134XXA Sprain of ligaments of cervical spine, initial encounter: Secondary | ICD-10-CM | POA: Diagnosis not present

## 2017-12-30 DIAGNOSIS — M545 Low back pain: Secondary | ICD-10-CM | POA: Diagnosis not present

## 2018-01-01 DIAGNOSIS — M545 Low back pain: Secondary | ICD-10-CM | POA: Diagnosis not present

## 2018-01-01 DIAGNOSIS — S134XXA Sprain of ligaments of cervical spine, initial encounter: Secondary | ICD-10-CM | POA: Diagnosis not present

## 2018-01-03 DIAGNOSIS — I2510 Atherosclerotic heart disease of native coronary artery without angina pectoris: Secondary | ICD-10-CM | POA: Diagnosis not present

## 2018-01-03 DIAGNOSIS — E118 Type 2 diabetes mellitus with unspecified complications: Secondary | ICD-10-CM | POA: Diagnosis not present

## 2018-01-03 DIAGNOSIS — I5022 Chronic systolic (congestive) heart failure: Secondary | ICD-10-CM | POA: Diagnosis not present

## 2018-01-03 DIAGNOSIS — Z9861 Coronary angioplasty status: Secondary | ICD-10-CM | POA: Diagnosis not present

## 2018-01-08 DIAGNOSIS — M545 Low back pain: Secondary | ICD-10-CM | POA: Diagnosis not present

## 2018-01-08 DIAGNOSIS — S134XXA Sprain of ligaments of cervical spine, initial encounter: Secondary | ICD-10-CM | POA: Diagnosis not present

## 2018-01-10 DIAGNOSIS — S134XXA Sprain of ligaments of cervical spine, initial encounter: Secondary | ICD-10-CM | POA: Diagnosis not present

## 2018-01-10 DIAGNOSIS — M545 Low back pain: Secondary | ICD-10-CM | POA: Diagnosis not present

## 2018-01-13 DIAGNOSIS — B351 Tinea unguium: Secondary | ICD-10-CM | POA: Diagnosis not present

## 2018-01-13 DIAGNOSIS — L608 Other nail disorders: Secondary | ICD-10-CM | POA: Diagnosis not present

## 2018-01-13 DIAGNOSIS — L819 Disorder of pigmentation, unspecified: Secondary | ICD-10-CM | POA: Diagnosis not present

## 2018-01-15 DIAGNOSIS — E663 Overweight: Secondary | ICD-10-CM | POA: Diagnosis not present

## 2018-01-16 DIAGNOSIS — E1165 Type 2 diabetes mellitus with hyperglycemia: Secondary | ICD-10-CM | POA: Diagnosis not present

## 2018-01-16 DIAGNOSIS — R011 Cardiac murmur, unspecified: Secondary | ICD-10-CM | POA: Diagnosis not present

## 2018-01-20 DIAGNOSIS — I517 Cardiomegaly: Secondary | ICD-10-CM | POA: Diagnosis not present

## 2018-01-20 DIAGNOSIS — Z79899 Other long term (current) drug therapy: Secondary | ICD-10-CM | POA: Diagnosis not present

## 2018-01-20 DIAGNOSIS — E1122 Type 2 diabetes mellitus with diabetic chronic kidney disease: Secondary | ICD-10-CM | POA: Diagnosis not present

## 2018-01-20 DIAGNOSIS — I255 Ischemic cardiomyopathy: Secondary | ICD-10-CM | POA: Diagnosis not present

## 2018-01-20 DIAGNOSIS — E1151 Type 2 diabetes mellitus with diabetic peripheral angiopathy without gangrene: Secondary | ICD-10-CM | POA: Diagnosis not present

## 2018-01-20 DIAGNOSIS — Z01810 Encounter for preprocedural cardiovascular examination: Secondary | ICD-10-CM | POA: Diagnosis not present

## 2018-01-20 DIAGNOSIS — D631 Anemia in chronic kidney disease: Secondary | ICD-10-CM | POA: Diagnosis not present

## 2018-01-20 DIAGNOSIS — N189 Chronic kidney disease, unspecified: Secondary | ICD-10-CM | POA: Diagnosis not present

## 2018-01-20 DIAGNOSIS — I130 Hypertensive heart and chronic kidney disease with heart failure and stage 1 through stage 4 chronic kidney disease, or unspecified chronic kidney disease: Secondary | ICD-10-CM | POA: Diagnosis not present

## 2018-01-20 DIAGNOSIS — J811 Chronic pulmonary edema: Secondary | ICD-10-CM | POA: Diagnosis not present

## 2018-01-20 DIAGNOSIS — N183 Chronic kidney disease, stage 3 (moderate): Secondary | ICD-10-CM | POA: Diagnosis not present

## 2018-01-20 DIAGNOSIS — I252 Old myocardial infarction: Secondary | ICD-10-CM | POA: Diagnosis not present

## 2018-01-20 DIAGNOSIS — Z87891 Personal history of nicotine dependence: Secondary | ICD-10-CM | POA: Diagnosis not present

## 2018-01-20 DIAGNOSIS — R9431 Abnormal electrocardiogram [ECG] [EKG]: Secondary | ICD-10-CM | POA: Diagnosis not present

## 2018-01-20 DIAGNOSIS — R0989 Other specified symptoms and signs involving the circulatory and respiratory systems: Secondary | ICD-10-CM | POA: Diagnosis not present

## 2018-01-20 DIAGNOSIS — I69354 Hemiplegia and hemiparesis following cerebral infarction affecting left non-dominant side: Secondary | ICD-10-CM | POA: Diagnosis not present

## 2018-01-20 DIAGNOSIS — I5022 Chronic systolic (congestive) heart failure: Secondary | ICD-10-CM | POA: Diagnosis not present

## 2018-01-20 DIAGNOSIS — Z7982 Long term (current) use of aspirin: Secondary | ICD-10-CM | POA: Diagnosis not present

## 2018-01-20 DIAGNOSIS — L97212 Non-pressure chronic ulcer of right calf with fat layer exposed: Secondary | ICD-10-CM | POA: Diagnosis not present

## 2018-01-20 DIAGNOSIS — I2510 Atherosclerotic heart disease of native coronary artery without angina pectoris: Secondary | ICD-10-CM | POA: Diagnosis not present

## 2018-01-20 DIAGNOSIS — Z8674 Personal history of sudden cardiac arrest: Secondary | ICD-10-CM | POA: Diagnosis not present

## 2018-01-20 DIAGNOSIS — L97222 Non-pressure chronic ulcer of left calf with fat layer exposed: Secondary | ICD-10-CM | POA: Diagnosis not present

## 2018-01-20 DIAGNOSIS — I872 Venous insufficiency (chronic) (peripheral): Secondary | ICD-10-CM | POA: Diagnosis not present

## 2018-01-20 DIAGNOSIS — I10 Essential (primary) hypertension: Secondary | ICD-10-CM | POA: Diagnosis not present

## 2018-01-20 DIAGNOSIS — Z91041 Radiographic dye allergy status: Secondary | ICD-10-CM | POA: Diagnosis not present

## 2018-01-20 DIAGNOSIS — I472 Ventricular tachycardia: Secondary | ICD-10-CM | POA: Diagnosis not present

## 2018-01-20 DIAGNOSIS — Z955 Presence of coronary angioplasty implant and graft: Secondary | ICD-10-CM | POA: Diagnosis not present

## 2018-01-20 DIAGNOSIS — E785 Hyperlipidemia, unspecified: Secondary | ICD-10-CM | POA: Diagnosis not present

## 2018-01-20 DIAGNOSIS — E119 Type 2 diabetes mellitus without complications: Secondary | ICD-10-CM | POA: Diagnosis not present

## 2018-01-20 DIAGNOSIS — Z45018 Encounter for adjustment and management of other part of cardiac pacemaker: Secondary | ICD-10-CM | POA: Diagnosis not present

## 2018-01-20 DIAGNOSIS — Z794 Long term (current) use of insulin: Secondary | ICD-10-CM | POA: Diagnosis not present

## 2018-02-05 DIAGNOSIS — E559 Vitamin D deficiency, unspecified: Secondary | ICD-10-CM | POA: Diagnosis not present

## 2018-02-05 DIAGNOSIS — R0600 Dyspnea, unspecified: Secondary | ICD-10-CM | POA: Diagnosis not present

## 2018-02-05 DIAGNOSIS — E782 Mixed hyperlipidemia: Secondary | ICD-10-CM | POA: Diagnosis not present

## 2018-02-05 DIAGNOSIS — E7800 Pure hypercholesterolemia, unspecified: Secondary | ICD-10-CM | POA: Diagnosis not present

## 2018-02-05 DIAGNOSIS — E1152 Type 2 diabetes mellitus with diabetic peripheral angiopathy with gangrene: Secondary | ICD-10-CM | POA: Diagnosis not present

## 2018-02-05 DIAGNOSIS — R05 Cough: Secondary | ICD-10-CM | POA: Diagnosis not present

## 2018-02-05 DIAGNOSIS — E1142 Type 2 diabetes mellitus with diabetic polyneuropathy: Secondary | ICD-10-CM | POA: Diagnosis not present

## 2018-02-05 DIAGNOSIS — E11621 Type 2 diabetes mellitus with foot ulcer: Secondary | ICD-10-CM | POA: Diagnosis not present

## 2018-02-05 DIAGNOSIS — R7982 Elevated C-reactive protein (CRP): Secondary | ICD-10-CM | POA: Diagnosis not present

## 2018-02-05 DIAGNOSIS — I110 Hypertensive heart disease with heart failure: Secondary | ICD-10-CM | POA: Diagnosis not present

## 2018-02-05 DIAGNOSIS — R601 Generalized edema: Secondary | ICD-10-CM | POA: Diagnosis not present

## 2018-02-05 DIAGNOSIS — E669 Obesity, unspecified: Secondary | ICD-10-CM | POA: Diagnosis not present

## 2018-02-05 DIAGNOSIS — R7309 Other abnormal glucose: Secondary | ICD-10-CM | POA: Diagnosis not present

## 2018-02-10 DIAGNOSIS — I110 Hypertensive heart disease with heart failure: Secondary | ICD-10-CM | POA: Diagnosis not present

## 2018-02-10 DIAGNOSIS — Z719 Counseling, unspecified: Secondary | ICD-10-CM | POA: Diagnosis not present

## 2018-02-10 DIAGNOSIS — R7982 Elevated C-reactive protein (CRP): Secondary | ICD-10-CM | POA: Diagnosis not present

## 2018-02-10 DIAGNOSIS — Z713 Dietary counseling and surveillance: Secondary | ICD-10-CM | POA: Diagnosis not present

## 2018-02-10 DIAGNOSIS — E1122 Type 2 diabetes mellitus with diabetic chronic kidney disease: Secondary | ICD-10-CM | POA: Diagnosis not present

## 2018-02-10 DIAGNOSIS — Z712 Person consulting for explanation of examination or test findings: Secondary | ICD-10-CM | POA: Diagnosis not present

## 2018-02-10 DIAGNOSIS — R748 Abnormal levels of other serum enzymes: Secondary | ICD-10-CM | POA: Diagnosis not present

## 2018-02-10 DIAGNOSIS — E1165 Type 2 diabetes mellitus with hyperglycemia: Secondary | ICD-10-CM | POA: Diagnosis not present

## 2018-02-10 DIAGNOSIS — D631 Anemia in chronic kidney disease: Secondary | ICD-10-CM | POA: Diagnosis not present

## 2018-02-10 DIAGNOSIS — E559 Vitamin D deficiency, unspecified: Secondary | ICD-10-CM | POA: Diagnosis not present

## 2018-02-10 DIAGNOSIS — I129 Hypertensive chronic kidney disease with stage 1 through stage 4 chronic kidney disease, or unspecified chronic kidney disease: Secondary | ICD-10-CM | POA: Diagnosis not present

## 2018-02-15 DIAGNOSIS — R011 Cardiac murmur, unspecified: Secondary | ICD-10-CM | POA: Diagnosis not present

## 2018-02-15 DIAGNOSIS — E1165 Type 2 diabetes mellitus with hyperglycemia: Secondary | ICD-10-CM | POA: Diagnosis not present

## 2018-02-19 DIAGNOSIS — I739 Peripheral vascular disease, unspecified: Secondary | ICD-10-CM | POA: Diagnosis not present

## 2018-02-19 DIAGNOSIS — I998 Other disorder of circulatory system: Secondary | ICD-10-CM | POA: Diagnosis not present

## 2018-02-19 DIAGNOSIS — I10 Essential (primary) hypertension: Secondary | ICD-10-CM | POA: Diagnosis not present

## 2018-02-19 DIAGNOSIS — I2510 Atherosclerotic heart disease of native coronary artery without angina pectoris: Secondary | ICD-10-CM | POA: Diagnosis not present

## 2018-02-20 DIAGNOSIS — N3081 Other cystitis with hematuria: Secondary | ICD-10-CM | POA: Diagnosis present

## 2018-02-20 DIAGNOSIS — D638 Anemia in other chronic diseases classified elsewhere: Secondary | ICD-10-CM | POA: Diagnosis present

## 2018-02-20 DIAGNOSIS — K5900 Constipation, unspecified: Secondary | ICD-10-CM | POA: Diagnosis not present

## 2018-02-20 DIAGNOSIS — E875 Hyperkalemia: Secondary | ICD-10-CM | POA: Diagnosis not present

## 2018-02-20 DIAGNOSIS — J9601 Acute respiratory failure with hypoxia: Secondary | ICD-10-CM | POA: Diagnosis present

## 2018-02-20 DIAGNOSIS — N3000 Acute cystitis without hematuria: Secondary | ICD-10-CM | POA: Diagnosis not present

## 2018-02-20 DIAGNOSIS — R918 Other nonspecific abnormal finding of lung field: Secondary | ICD-10-CM | POA: Diagnosis not present

## 2018-02-20 DIAGNOSIS — R1084 Generalized abdominal pain: Secondary | ICD-10-CM | POA: Diagnosis not present

## 2018-02-20 DIAGNOSIS — Z743 Need for continuous supervision: Secondary | ICD-10-CM | POA: Diagnosis not present

## 2018-02-20 DIAGNOSIS — I69354 Hemiplegia and hemiparesis following cerebral infarction affecting left non-dominant side: Secondary | ICD-10-CM | POA: Diagnosis not present

## 2018-02-20 DIAGNOSIS — E876 Hypokalemia: Secondary | ICD-10-CM | POA: Diagnosis not present

## 2018-02-20 DIAGNOSIS — R748 Abnormal levels of other serum enzymes: Secondary | ICD-10-CM | POA: Diagnosis not present

## 2018-02-20 DIAGNOSIS — N184 Chronic kidney disease, stage 4 (severe): Secondary | ICD-10-CM | POA: Diagnosis present

## 2018-02-20 DIAGNOSIS — E11649 Type 2 diabetes mellitus with hypoglycemia without coma: Secondary | ICD-10-CM | POA: Diagnosis not present

## 2018-02-20 DIAGNOSIS — I509 Heart failure, unspecified: Secondary | ICD-10-CM | POA: Diagnosis not present

## 2018-02-20 DIAGNOSIS — E8770 Fluid overload, unspecified: Secondary | ICD-10-CM | POA: Diagnosis not present

## 2018-02-20 DIAGNOSIS — I10 Essential (primary) hypertension: Secondary | ICD-10-CM | POA: Diagnosis not present

## 2018-02-20 DIAGNOSIS — R7989 Other specified abnormal findings of blood chemistry: Secondary | ICD-10-CM | POA: Diagnosis present

## 2018-02-20 DIAGNOSIS — I501 Left ventricular failure: Secondary | ICD-10-CM | POA: Diagnosis not present

## 2018-02-20 DIAGNOSIS — N3080 Other cystitis without hematuria: Secondary | ICD-10-CM | POA: Diagnosis not present

## 2018-02-20 DIAGNOSIS — I209 Angina pectoris, unspecified: Secondary | ICD-10-CM | POA: Diagnosis not present

## 2018-02-20 DIAGNOSIS — I70201 Unspecified atherosclerosis of native arteries of extremities, right leg: Secondary | ICD-10-CM | POA: Diagnosis present

## 2018-02-20 DIAGNOSIS — N189 Chronic kidney disease, unspecified: Secondary | ICD-10-CM | POA: Diagnosis not present

## 2018-02-20 DIAGNOSIS — I639 Cerebral infarction, unspecified: Secondary | ICD-10-CM | POA: Diagnosis not present

## 2018-02-20 DIAGNOSIS — R109 Unspecified abdominal pain: Secondary | ICD-10-CM | POA: Diagnosis not present

## 2018-02-20 DIAGNOSIS — Z9989 Dependence on other enabling machines and devices: Secondary | ICD-10-CM | POA: Diagnosis not present

## 2018-02-20 DIAGNOSIS — R079 Chest pain, unspecified: Secondary | ICD-10-CM | POA: Diagnosis present

## 2018-02-20 DIAGNOSIS — I255 Ischemic cardiomyopathy: Secondary | ICD-10-CM | POA: Diagnosis present

## 2018-02-20 DIAGNOSIS — R0602 Shortness of breath: Secondary | ICD-10-CM | POA: Diagnosis not present

## 2018-02-20 DIAGNOSIS — I252 Old myocardial infarction: Secondary | ICD-10-CM | POA: Diagnosis not present

## 2018-02-20 DIAGNOSIS — R601 Generalized edema: Secondary | ICD-10-CM | POA: Diagnosis not present

## 2018-02-20 DIAGNOSIS — E1122 Type 2 diabetes mellitus with diabetic chronic kidney disease: Secondary | ICD-10-CM | POA: Diagnosis present

## 2018-02-20 DIAGNOSIS — E1151 Type 2 diabetes mellitus with diabetic peripheral angiopathy without gangrene: Secondary | ICD-10-CM | POA: Diagnosis present

## 2018-02-20 DIAGNOSIS — I959 Hypotension, unspecified: Secondary | ICD-10-CM | POA: Diagnosis not present

## 2018-02-20 DIAGNOSIS — N179 Acute kidney failure, unspecified: Secondary | ICD-10-CM | POA: Diagnosis not present

## 2018-02-20 DIAGNOSIS — K219 Gastro-esophageal reflux disease without esophagitis: Secondary | ICD-10-CM | POA: Diagnosis present

## 2018-02-20 DIAGNOSIS — R001 Bradycardia, unspecified: Secondary | ICD-10-CM | POA: Diagnosis not present

## 2018-02-20 DIAGNOSIS — I130 Hypertensive heart and chronic kidney disease with heart failure and stage 1 through stage 4 chronic kidney disease, or unspecified chronic kidney disease: Secondary | ICD-10-CM | POA: Diagnosis not present

## 2018-02-20 DIAGNOSIS — F17210 Nicotine dependence, cigarettes, uncomplicated: Secondary | ICD-10-CM | POA: Diagnosis present

## 2018-02-20 DIAGNOSIS — I429 Cardiomyopathy, unspecified: Secondary | ICD-10-CM | POA: Diagnosis not present

## 2018-02-20 DIAGNOSIS — E785 Hyperlipidemia, unspecified: Secondary | ICD-10-CM | POA: Diagnosis present

## 2018-02-20 DIAGNOSIS — I110 Hypertensive heart disease with heart failure: Secondary | ICD-10-CM | POA: Diagnosis present

## 2018-02-20 DIAGNOSIS — R0902 Hypoxemia: Secondary | ICD-10-CM | POA: Diagnosis not present

## 2018-02-20 DIAGNOSIS — I2510 Atherosclerotic heart disease of native coronary artery without angina pectoris: Secondary | ICD-10-CM | POA: Diagnosis present

## 2018-02-20 DIAGNOSIS — I5043 Acute on chronic combined systolic (congestive) and diastolic (congestive) heart failure: Secondary | ICD-10-CM | POA: Diagnosis present

## 2018-02-20 DIAGNOSIS — J90 Pleural effusion, not elsewhere classified: Secondary | ICD-10-CM | POA: Diagnosis not present

## 2018-02-28 DIAGNOSIS — I83018 Varicose veins of right lower extremity with ulcer other part of lower leg: Secondary | ICD-10-CM | POA: Diagnosis not present

## 2018-02-28 DIAGNOSIS — K5900 Constipation, unspecified: Secondary | ICD-10-CM | POA: Diagnosis not present

## 2018-02-28 DIAGNOSIS — N179 Acute kidney failure, unspecified: Secondary | ICD-10-CM | POA: Diagnosis not present

## 2018-02-28 DIAGNOSIS — Z79899 Other long term (current) drug therapy: Secondary | ICD-10-CM | POA: Diagnosis not present

## 2018-02-28 DIAGNOSIS — D649 Anemia, unspecified: Secondary | ICD-10-CM | POA: Diagnosis not present

## 2018-02-28 DIAGNOSIS — I6930 Unspecified sequelae of cerebral infarction: Secondary | ICD-10-CM | POA: Diagnosis not present

## 2018-02-28 DIAGNOSIS — I130 Hypertensive heart and chronic kidney disease with heart failure and stage 1 through stage 4 chronic kidney disease, or unspecified chronic kidney disease: Secondary | ICD-10-CM | POA: Diagnosis not present

## 2018-02-28 DIAGNOSIS — R109 Unspecified abdominal pain: Secondary | ICD-10-CM | POA: Diagnosis not present

## 2018-02-28 DIAGNOSIS — I440 Atrioventricular block, first degree: Secondary | ICD-10-CM | POA: Diagnosis not present

## 2018-02-28 DIAGNOSIS — Z7189 Other specified counseling: Secondary | ICD-10-CM | POA: Diagnosis not present

## 2018-02-28 DIAGNOSIS — M6281 Muscle weakness (generalized): Secondary | ICD-10-CM | POA: Diagnosis not present

## 2018-02-28 DIAGNOSIS — E119 Type 2 diabetes mellitus without complications: Secondary | ICD-10-CM | POA: Diagnosis not present

## 2018-02-28 DIAGNOSIS — Z9989 Dependence on other enabling machines and devices: Secondary | ICD-10-CM | POA: Diagnosis not present

## 2018-02-28 DIAGNOSIS — R0602 Shortness of breath: Secondary | ICD-10-CM | POA: Diagnosis not present

## 2018-02-28 DIAGNOSIS — N184 Chronic kidney disease, stage 4 (severe): Secondary | ICD-10-CM | POA: Diagnosis not present

## 2018-02-28 DIAGNOSIS — I252 Old myocardial infarction: Secondary | ICD-10-CM | POA: Diagnosis not present

## 2018-02-28 DIAGNOSIS — N189 Chronic kidney disease, unspecified: Secondary | ICD-10-CM | POA: Diagnosis not present

## 2018-02-28 DIAGNOSIS — I639 Cerebral infarction, unspecified: Secondary | ICD-10-CM | POA: Diagnosis not present

## 2018-02-28 DIAGNOSIS — I83028 Varicose veins of left lower extremity with ulcer other part of lower leg: Secondary | ICD-10-CM | POA: Diagnosis not present

## 2018-02-28 DIAGNOSIS — R918 Other nonspecific abnormal finding of lung field: Secondary | ICD-10-CM | POA: Diagnosis not present

## 2018-02-28 DIAGNOSIS — I10 Essential (primary) hypertension: Secondary | ICD-10-CM | POA: Diagnosis not present

## 2018-02-28 DIAGNOSIS — L97529 Non-pressure chronic ulcer of other part of left foot with unspecified severity: Secondary | ICD-10-CM | POA: Diagnosis not present

## 2018-02-28 DIAGNOSIS — M6289 Other specified disorders of muscle: Secondary | ICD-10-CM | POA: Diagnosis not present

## 2018-02-28 DIAGNOSIS — R260 Ataxic gait: Secondary | ICD-10-CM | POA: Diagnosis not present

## 2018-02-28 DIAGNOSIS — I509 Heart failure, unspecified: Secondary | ICD-10-CM | POA: Diagnosis not present

## 2018-02-28 DIAGNOSIS — R5381 Other malaise: Secondary | ICD-10-CM | POA: Diagnosis not present

## 2018-02-28 DIAGNOSIS — I7091 Generalized atherosclerosis: Secondary | ICD-10-CM | POA: Diagnosis not present

## 2018-02-28 DIAGNOSIS — I429 Cardiomyopathy, unspecified: Secondary | ICD-10-CM | POA: Diagnosis not present

## 2018-02-28 DIAGNOSIS — E875 Hyperkalemia: Secondary | ICD-10-CM | POA: Diagnosis not present

## 2018-02-28 DIAGNOSIS — E785 Hyperlipidemia, unspecified: Secondary | ICD-10-CM | POA: Diagnosis not present

## 2018-02-28 DIAGNOSIS — Z741 Need for assistance with personal care: Secondary | ICD-10-CM | POA: Diagnosis not present

## 2018-02-28 DIAGNOSIS — J189 Pneumonia, unspecified organism: Secondary | ICD-10-CM | POA: Diagnosis not present

## 2018-02-28 DIAGNOSIS — Z9181 History of falling: Secondary | ICD-10-CM | POA: Diagnosis not present

## 2018-03-03 DIAGNOSIS — I509 Heart failure, unspecified: Secondary | ICD-10-CM | POA: Diagnosis not present

## 2018-03-03 DIAGNOSIS — Z741 Need for assistance with personal care: Secondary | ICD-10-CM | POA: Diagnosis not present

## 2018-03-03 DIAGNOSIS — K5900 Constipation, unspecified: Secondary | ICD-10-CM | POA: Diagnosis not present

## 2018-03-03 DIAGNOSIS — I7091 Generalized atherosclerosis: Secondary | ICD-10-CM | POA: Diagnosis not present

## 2018-03-03 DIAGNOSIS — Z7189 Other specified counseling: Secondary | ICD-10-CM | POA: Diagnosis not present

## 2018-03-03 DIAGNOSIS — Z79899 Other long term (current) drug therapy: Secondary | ICD-10-CM | POA: Diagnosis not present

## 2018-03-03 DIAGNOSIS — R5381 Other malaise: Secondary | ICD-10-CM | POA: Diagnosis not present

## 2018-03-03 DIAGNOSIS — I639 Cerebral infarction, unspecified: Secondary | ICD-10-CM | POA: Diagnosis not present

## 2018-03-03 DIAGNOSIS — M6281 Muscle weakness (generalized): Secondary | ICD-10-CM | POA: Diagnosis not present

## 2018-03-04 DIAGNOSIS — I7091 Generalized atherosclerosis: Secondary | ICD-10-CM | POA: Diagnosis not present

## 2018-03-04 DIAGNOSIS — M6289 Other specified disorders of muscle: Secondary | ICD-10-CM | POA: Diagnosis not present

## 2018-03-04 DIAGNOSIS — I6930 Unspecified sequelae of cerebral infarction: Secondary | ICD-10-CM | POA: Diagnosis not present

## 2018-03-04 DIAGNOSIS — Z741 Need for assistance with personal care: Secondary | ICD-10-CM | POA: Diagnosis not present

## 2018-03-04 DIAGNOSIS — R260 Ataxic gait: Secondary | ICD-10-CM | POA: Diagnosis not present

## 2018-03-04 DIAGNOSIS — I509 Heart failure, unspecified: Secondary | ICD-10-CM | POA: Diagnosis not present

## 2018-03-04 DIAGNOSIS — R5381 Other malaise: Secondary | ICD-10-CM | POA: Diagnosis not present

## 2018-03-04 DIAGNOSIS — M6281 Muscle weakness (generalized): Secondary | ICD-10-CM | POA: Diagnosis not present

## 2018-03-05 DIAGNOSIS — R260 Ataxic gait: Secondary | ICD-10-CM | POA: Diagnosis not present

## 2018-03-05 DIAGNOSIS — I10 Essential (primary) hypertension: Secondary | ICD-10-CM | POA: Diagnosis not present

## 2018-03-05 DIAGNOSIS — Z741 Need for assistance with personal care: Secondary | ICD-10-CM | POA: Diagnosis not present

## 2018-03-05 DIAGNOSIS — N179 Acute kidney failure, unspecified: Secondary | ICD-10-CM | POA: Diagnosis not present

## 2018-03-05 DIAGNOSIS — R5381 Other malaise: Secondary | ICD-10-CM | POA: Diagnosis not present

## 2018-03-05 DIAGNOSIS — M6289 Other specified disorders of muscle: Secondary | ICD-10-CM | POA: Diagnosis not present

## 2018-03-05 DIAGNOSIS — R0602 Shortness of breath: Secondary | ICD-10-CM | POA: Diagnosis not present

## 2018-03-05 DIAGNOSIS — I6930 Unspecified sequelae of cerebral infarction: Secondary | ICD-10-CM | POA: Diagnosis not present

## 2018-03-05 DIAGNOSIS — M6281 Muscle weakness (generalized): Secondary | ICD-10-CM | POA: Diagnosis not present

## 2018-03-05 DIAGNOSIS — I509 Heart failure, unspecified: Secondary | ICD-10-CM | POA: Diagnosis not present

## 2018-03-05 DIAGNOSIS — I7091 Generalized atherosclerosis: Secondary | ICD-10-CM | POA: Diagnosis not present

## 2018-03-06 DIAGNOSIS — N184 Chronic kidney disease, stage 4 (severe): Secondary | ICD-10-CM | POA: Diagnosis not present

## 2018-03-06 DIAGNOSIS — E119 Type 2 diabetes mellitus without complications: Secondary | ICD-10-CM | POA: Diagnosis not present

## 2018-03-06 DIAGNOSIS — D649 Anemia, unspecified: Secondary | ICD-10-CM | POA: Diagnosis not present

## 2018-03-06 DIAGNOSIS — I509 Heart failure, unspecified: Secondary | ICD-10-CM | POA: Diagnosis not present

## 2018-03-06 DIAGNOSIS — I10 Essential (primary) hypertension: Secondary | ICD-10-CM | POA: Diagnosis not present

## 2018-03-07 DIAGNOSIS — I509 Heart failure, unspecified: Secondary | ICD-10-CM | POA: Diagnosis not present

## 2018-03-07 DIAGNOSIS — L97529 Non-pressure chronic ulcer of other part of left foot with unspecified severity: Secondary | ICD-10-CM | POA: Diagnosis not present

## 2018-03-07 DIAGNOSIS — I7091 Generalized atherosclerosis: Secondary | ICD-10-CM | POA: Diagnosis not present

## 2018-03-07 DIAGNOSIS — R5381 Other malaise: Secondary | ICD-10-CM | POA: Diagnosis not present

## 2018-03-07 DIAGNOSIS — M6289 Other specified disorders of muscle: Secondary | ICD-10-CM | POA: Diagnosis not present

## 2018-03-07 DIAGNOSIS — M6281 Muscle weakness (generalized): Secondary | ICD-10-CM | POA: Diagnosis not present

## 2018-03-07 DIAGNOSIS — R260 Ataxic gait: Secondary | ICD-10-CM | POA: Diagnosis not present

## 2018-03-07 DIAGNOSIS — I6930 Unspecified sequelae of cerebral infarction: Secondary | ICD-10-CM | POA: Diagnosis not present

## 2018-03-07 DIAGNOSIS — I83028 Varicose veins of left lower extremity with ulcer other part of lower leg: Secondary | ICD-10-CM | POA: Diagnosis not present

## 2018-03-07 DIAGNOSIS — I83018 Varicose veins of right lower extremity with ulcer other part of lower leg: Secondary | ICD-10-CM | POA: Diagnosis not present

## 2018-03-07 DIAGNOSIS — Z741 Need for assistance with personal care: Secondary | ICD-10-CM | POA: Diagnosis not present

## 2018-03-10 DIAGNOSIS — E875 Hyperkalemia: Secondary | ICD-10-CM | POA: Diagnosis not present

## 2018-03-10 DIAGNOSIS — J189 Pneumonia, unspecified organism: Secondary | ICD-10-CM | POA: Diagnosis not present

## 2018-03-10 DIAGNOSIS — I6930 Unspecified sequelae of cerebral infarction: Secondary | ICD-10-CM | POA: Diagnosis not present

## 2018-03-10 DIAGNOSIS — Z741 Need for assistance with personal care: Secondary | ICD-10-CM | POA: Diagnosis not present

## 2018-03-10 DIAGNOSIS — D631 Anemia in chronic kidney disease: Secondary | ICD-10-CM | POA: Diagnosis not present

## 2018-03-10 DIAGNOSIS — R079 Chest pain, unspecified: Secondary | ICD-10-CM | POA: Diagnosis not present

## 2018-03-10 DIAGNOSIS — N189 Chronic kidney disease, unspecified: Secondary | ICD-10-CM | POA: Diagnosis not present

## 2018-03-10 DIAGNOSIS — Z9181 History of falling: Secondary | ICD-10-CM | POA: Diagnosis not present

## 2018-03-10 DIAGNOSIS — R260 Ataxic gait: Secondary | ICD-10-CM | POA: Diagnosis not present

## 2018-03-10 DIAGNOSIS — I130 Hypertensive heart and chronic kidney disease with heart failure and stage 1 through stage 4 chronic kidney disease, or unspecified chronic kidney disease: Secondary | ICD-10-CM | POA: Diagnosis not present

## 2018-03-10 DIAGNOSIS — I509 Heart failure, unspecified: Secondary | ICD-10-CM | POA: Diagnosis not present

## 2018-03-10 DIAGNOSIS — I7091 Generalized atherosclerosis: Secondary | ICD-10-CM | POA: Diagnosis not present

## 2018-03-10 DIAGNOSIS — R5381 Other malaise: Secondary | ICD-10-CM | POA: Diagnosis not present

## 2018-03-10 DIAGNOSIS — M6289 Other specified disorders of muscle: Secondary | ICD-10-CM | POA: Diagnosis not present

## 2018-03-10 DIAGNOSIS — E1122 Type 2 diabetes mellitus with diabetic chronic kidney disease: Secondary | ICD-10-CM | POA: Diagnosis not present

## 2018-03-10 DIAGNOSIS — E785 Hyperlipidemia, unspecified: Secondary | ICD-10-CM | POA: Diagnosis not present

## 2018-03-10 DIAGNOSIS — M6281 Muscle weakness (generalized): Secondary | ICD-10-CM | POA: Diagnosis not present

## 2018-03-11 DIAGNOSIS — D649 Anemia, unspecified: Secondary | ICD-10-CM | POA: Diagnosis present

## 2018-03-11 DIAGNOSIS — N184 Chronic kidney disease, stage 4 (severe): Secondary | ICD-10-CM | POA: Diagnosis not present

## 2018-03-11 DIAGNOSIS — E1122 Type 2 diabetes mellitus with diabetic chronic kidney disease: Secondary | ICD-10-CM | POA: Diagnosis present

## 2018-03-11 DIAGNOSIS — D631 Anemia in chronic kidney disease: Secondary | ICD-10-CM | POA: Diagnosis not present

## 2018-03-11 DIAGNOSIS — E119 Type 2 diabetes mellitus without complications: Secondary | ICD-10-CM | POA: Diagnosis not present

## 2018-03-11 DIAGNOSIS — I872 Venous insufficiency (chronic) (peripheral): Secondary | ICD-10-CM | POA: Diagnosis not present

## 2018-03-11 DIAGNOSIS — I493 Ventricular premature depolarization: Secondary | ICD-10-CM | POA: Diagnosis not present

## 2018-03-11 DIAGNOSIS — I340 Nonrheumatic mitral (valve) insufficiency: Secondary | ICD-10-CM | POA: Diagnosis not present

## 2018-03-11 DIAGNOSIS — I361 Nonrheumatic tricuspid (valve) insufficiency: Secondary | ICD-10-CM | POA: Diagnosis not present

## 2018-03-11 DIAGNOSIS — Z951 Presence of aortocoronary bypass graft: Secondary | ICD-10-CM | POA: Diagnosis not present

## 2018-03-11 DIAGNOSIS — Z743 Need for continuous supervision: Secondary | ICD-10-CM | POA: Diagnosis not present

## 2018-03-11 DIAGNOSIS — E11621 Type 2 diabetes mellitus with foot ulcer: Secondary | ICD-10-CM | POA: Diagnosis present

## 2018-03-11 DIAGNOSIS — I2510 Atherosclerotic heart disease of native coronary artery without angina pectoris: Secondary | ICD-10-CM | POA: Diagnosis present

## 2018-03-11 DIAGNOSIS — I371 Nonrheumatic pulmonary valve insufficiency: Secondary | ICD-10-CM | POA: Diagnosis not present

## 2018-03-11 DIAGNOSIS — I639 Cerebral infarction, unspecified: Secondary | ICD-10-CM | POA: Diagnosis not present

## 2018-03-11 DIAGNOSIS — Z794 Long term (current) use of insulin: Secondary | ICD-10-CM | POA: Diagnosis not present

## 2018-03-11 DIAGNOSIS — I509 Heart failure, unspecified: Secondary | ICD-10-CM | POA: Diagnosis present

## 2018-03-11 DIAGNOSIS — I252 Old myocardial infarction: Secondary | ICD-10-CM | POA: Diagnosis not present

## 2018-03-11 DIAGNOSIS — R079 Chest pain, unspecified: Secondary | ICD-10-CM | POA: Diagnosis not present

## 2018-03-11 DIAGNOSIS — E1151 Type 2 diabetes mellitus with diabetic peripheral angiopathy without gangrene: Secondary | ICD-10-CM | POA: Diagnosis present

## 2018-03-11 DIAGNOSIS — E785 Hyperlipidemia, unspecified: Secondary | ICD-10-CM | POA: Diagnosis present

## 2018-03-11 DIAGNOSIS — N189 Chronic kidney disease, unspecified: Secondary | ICD-10-CM | POA: Diagnosis present

## 2018-03-11 DIAGNOSIS — I739 Peripheral vascular disease, unspecified: Secondary | ICD-10-CM | POA: Diagnosis not present

## 2018-03-11 DIAGNOSIS — R0789 Other chest pain: Secondary | ICD-10-CM | POA: Diagnosis not present

## 2018-03-11 DIAGNOSIS — D638 Anemia in other chronic diseases classified elsewhere: Secondary | ICD-10-CM | POA: Diagnosis present

## 2018-03-11 DIAGNOSIS — I081 Rheumatic disorders of both mitral and tricuspid valves: Secondary | ICD-10-CM | POA: Diagnosis present

## 2018-03-11 DIAGNOSIS — E1165 Type 2 diabetes mellitus with hyperglycemia: Secondary | ICD-10-CM | POA: Diagnosis present

## 2018-03-11 DIAGNOSIS — I498 Other specified cardiac arrhythmias: Secondary | ICD-10-CM | POA: Diagnosis not present

## 2018-03-11 DIAGNOSIS — I130 Hypertensive heart and chronic kidney disease with heart failure and stage 1 through stage 4 chronic kidney disease, or unspecified chronic kidney disease: Secondary | ICD-10-CM | POA: Diagnosis present

## 2018-03-11 DIAGNOSIS — J189 Pneumonia, unspecified organism: Secondary | ICD-10-CM | POA: Diagnosis present

## 2018-03-11 DIAGNOSIS — I129 Hypertensive chronic kidney disease with stage 1 through stage 4 chronic kidney disease, or unspecified chronic kidney disease: Secondary | ICD-10-CM | POA: Diagnosis not present

## 2018-03-13 DIAGNOSIS — I8291 Chronic embolism and thrombosis of unspecified vein: Secondary | ICD-10-CM | POA: Diagnosis not present

## 2018-03-13 DIAGNOSIS — I10 Essential (primary) hypertension: Secondary | ICD-10-CM | POA: Diagnosis not present

## 2018-03-13 DIAGNOSIS — R3914 Feeling of incomplete bladder emptying: Secondary | ICD-10-CM | POA: Diagnosis not present

## 2018-03-13 DIAGNOSIS — Z741 Need for assistance with personal care: Secondary | ICD-10-CM | POA: Diagnosis not present

## 2018-03-13 DIAGNOSIS — L97529 Non-pressure chronic ulcer of other part of left foot with unspecified severity: Secondary | ICD-10-CM | POA: Diagnosis not present

## 2018-03-13 DIAGNOSIS — F4323 Adjustment disorder with mixed anxiety and depressed mood: Secondary | ICD-10-CM | POA: Diagnosis not present

## 2018-03-13 DIAGNOSIS — R338 Other retention of urine: Secondary | ICD-10-CM | POA: Diagnosis not present

## 2018-03-13 DIAGNOSIS — I872 Venous insufficiency (chronic) (peripheral): Secondary | ICD-10-CM | POA: Diagnosis not present

## 2018-03-13 DIAGNOSIS — I5042 Chronic combined systolic (congestive) and diastolic (congestive) heart failure: Secondary | ICD-10-CM | POA: Diagnosis not present

## 2018-03-13 DIAGNOSIS — E119 Type 2 diabetes mellitus without complications: Secondary | ICD-10-CM | POA: Diagnosis not present

## 2018-03-13 DIAGNOSIS — D649 Anemia, unspecified: Secondary | ICD-10-CM | POA: Diagnosis not present

## 2018-03-13 DIAGNOSIS — Z743 Need for continuous supervision: Secondary | ICD-10-CM | POA: Diagnosis not present

## 2018-03-13 DIAGNOSIS — I83028 Varicose veins of left lower extremity with ulcer other part of lower leg: Secondary | ICD-10-CM | POA: Diagnosis not present

## 2018-03-13 DIAGNOSIS — I639 Cerebral infarction, unspecified: Secondary | ICD-10-CM | POA: Diagnosis not present

## 2018-03-13 DIAGNOSIS — N184 Chronic kidney disease, stage 4 (severe): Secondary | ICD-10-CM | POA: Diagnosis not present

## 2018-03-13 DIAGNOSIS — R634 Abnormal weight loss: Secondary | ICD-10-CM | POA: Diagnosis not present

## 2018-03-13 DIAGNOSIS — I371 Nonrheumatic pulmonary valve insufficiency: Secondary | ICD-10-CM | POA: Diagnosis not present

## 2018-03-13 DIAGNOSIS — R109 Unspecified abdominal pain: Secondary | ICD-10-CM | POA: Diagnosis not present

## 2018-03-13 DIAGNOSIS — N179 Acute kidney failure, unspecified: Secondary | ICD-10-CM | POA: Diagnosis not present

## 2018-03-13 DIAGNOSIS — I7091 Generalized atherosclerosis: Secondary | ICD-10-CM | POA: Diagnosis not present

## 2018-03-13 DIAGNOSIS — I252 Old myocardial infarction: Secondary | ICD-10-CM | POA: Diagnosis not present

## 2018-03-13 DIAGNOSIS — I361 Nonrheumatic tricuspid (valve) insufficiency: Secondary | ICD-10-CM | POA: Diagnosis not present

## 2018-03-13 DIAGNOSIS — R5381 Other malaise: Secondary | ICD-10-CM | POA: Diagnosis not present

## 2018-03-13 DIAGNOSIS — I83018 Varicose veins of right lower extremity with ulcer other part of lower leg: Secondary | ICD-10-CM | POA: Diagnosis not present

## 2018-03-13 DIAGNOSIS — M6281 Muscle weakness (generalized): Secondary | ICD-10-CM | POA: Diagnosis not present

## 2018-03-13 DIAGNOSIS — F068 Other specified mental disorders due to known physiological condition: Secondary | ICD-10-CM | POA: Diagnosis not present

## 2018-03-13 DIAGNOSIS — R339 Retention of urine, unspecified: Secondary | ICD-10-CM | POA: Diagnosis not present

## 2018-03-13 DIAGNOSIS — M6289 Other specified disorders of muscle: Secondary | ICD-10-CM | POA: Diagnosis not present

## 2018-03-13 DIAGNOSIS — E785 Hyperlipidemia, unspecified: Secondary | ICD-10-CM | POA: Diagnosis not present

## 2018-03-13 DIAGNOSIS — R0789 Other chest pain: Secondary | ICD-10-CM | POA: Diagnosis not present

## 2018-03-13 DIAGNOSIS — R260 Ataxic gait: Secondary | ICD-10-CM | POA: Diagnosis not present

## 2018-03-13 DIAGNOSIS — E11621 Type 2 diabetes mellitus with foot ulcer: Secondary | ICD-10-CM | POA: Diagnosis not present

## 2018-03-13 DIAGNOSIS — I509 Heart failure, unspecified: Secondary | ICD-10-CM | POA: Diagnosis not present

## 2018-03-13 DIAGNOSIS — I255 Ischemic cardiomyopathy: Secondary | ICD-10-CM | POA: Diagnosis not present

## 2018-03-13 DIAGNOSIS — I6930 Unspecified sequelae of cerebral infarction: Secondary | ICD-10-CM | POA: Diagnosis not present

## 2018-03-13 DIAGNOSIS — N189 Chronic kidney disease, unspecified: Secondary | ICD-10-CM | POA: Diagnosis not present

## 2018-03-13 DIAGNOSIS — I340 Nonrheumatic mitral (valve) insufficiency: Secondary | ICD-10-CM | POA: Diagnosis not present

## 2018-03-13 DIAGNOSIS — R112 Nausea with vomiting, unspecified: Secondary | ICD-10-CM | POA: Diagnosis not present

## 2018-03-13 DIAGNOSIS — R1030 Lower abdominal pain, unspecified: Secondary | ICD-10-CM | POA: Diagnosis not present

## 2018-03-13 DIAGNOSIS — R1032 Left lower quadrant pain: Secondary | ICD-10-CM | POA: Diagnosis not present

## 2018-03-13 DIAGNOSIS — K5900 Constipation, unspecified: Secondary | ICD-10-CM | POA: Diagnosis not present

## 2018-03-13 DIAGNOSIS — I429 Cardiomyopathy, unspecified: Secondary | ICD-10-CM | POA: Diagnosis not present

## 2018-03-18 DIAGNOSIS — N184 Chronic kidney disease, stage 4 (severe): Secondary | ICD-10-CM | POA: Diagnosis not present

## 2018-03-18 DIAGNOSIS — I10 Essential (primary) hypertension: Secondary | ICD-10-CM | POA: Diagnosis not present

## 2018-03-19 DIAGNOSIS — I83018 Varicose veins of right lower extremity with ulcer other part of lower leg: Secondary | ICD-10-CM | POA: Diagnosis not present

## 2018-03-19 DIAGNOSIS — I83028 Varicose veins of left lower extremity with ulcer other part of lower leg: Secondary | ICD-10-CM | POA: Diagnosis not present

## 2018-03-20 DIAGNOSIS — I8291 Chronic embolism and thrombosis of unspecified vein: Secondary | ICD-10-CM | POA: Diagnosis not present

## 2018-03-20 DIAGNOSIS — I10 Essential (primary) hypertension: Secondary | ICD-10-CM | POA: Diagnosis not present

## 2018-03-20 DIAGNOSIS — I255 Ischemic cardiomyopathy: Secondary | ICD-10-CM | POA: Diagnosis not present

## 2018-03-20 DIAGNOSIS — I7091 Generalized atherosclerosis: Secondary | ICD-10-CM | POA: Diagnosis not present

## 2018-03-20 DIAGNOSIS — M6289 Other specified disorders of muscle: Secondary | ICD-10-CM | POA: Diagnosis not present

## 2018-03-20 DIAGNOSIS — R5381 Other malaise: Secondary | ICD-10-CM | POA: Diagnosis not present

## 2018-03-20 DIAGNOSIS — I509 Heart failure, unspecified: Secondary | ICD-10-CM | POA: Diagnosis not present

## 2018-03-20 DIAGNOSIS — R260 Ataxic gait: Secondary | ICD-10-CM | POA: Diagnosis not present

## 2018-03-20 DIAGNOSIS — M6281 Muscle weakness (generalized): Secondary | ICD-10-CM | POA: Diagnosis not present

## 2018-03-20 DIAGNOSIS — Z741 Need for assistance with personal care: Secondary | ICD-10-CM | POA: Diagnosis not present

## 2018-03-20 DIAGNOSIS — I6930 Unspecified sequelae of cerebral infarction: Secondary | ICD-10-CM | POA: Diagnosis not present

## 2018-03-20 DIAGNOSIS — I5042 Chronic combined systolic (congestive) and diastolic (congestive) heart failure: Secondary | ICD-10-CM | POA: Diagnosis not present

## 2018-03-20 DIAGNOSIS — F4323 Adjustment disorder with mixed anxiety and depressed mood: Secondary | ICD-10-CM | POA: Diagnosis not present

## 2018-03-21 DIAGNOSIS — I6930 Unspecified sequelae of cerebral infarction: Secondary | ICD-10-CM | POA: Diagnosis not present

## 2018-03-21 DIAGNOSIS — M6281 Muscle weakness (generalized): Secondary | ICD-10-CM | POA: Diagnosis not present

## 2018-03-21 DIAGNOSIS — I7091 Generalized atherosclerosis: Secondary | ICD-10-CM | POA: Diagnosis not present

## 2018-03-21 DIAGNOSIS — R109 Unspecified abdominal pain: Secondary | ICD-10-CM | POA: Diagnosis not present

## 2018-03-21 DIAGNOSIS — Z741 Need for assistance with personal care: Secondary | ICD-10-CM | POA: Diagnosis not present

## 2018-03-21 DIAGNOSIS — I509 Heart failure, unspecified: Secondary | ICD-10-CM | POA: Diagnosis not present

## 2018-03-21 DIAGNOSIS — R5381 Other malaise: Secondary | ICD-10-CM | POA: Diagnosis not present

## 2018-03-21 DIAGNOSIS — E119 Type 2 diabetes mellitus without complications: Secondary | ICD-10-CM | POA: Diagnosis not present

## 2018-03-21 DIAGNOSIS — R260 Ataxic gait: Secondary | ICD-10-CM | POA: Diagnosis not present

## 2018-03-21 DIAGNOSIS — M6289 Other specified disorders of muscle: Secondary | ICD-10-CM | POA: Diagnosis not present

## 2018-03-21 DIAGNOSIS — R634 Abnormal weight loss: Secondary | ICD-10-CM | POA: Diagnosis not present

## 2018-03-22 DIAGNOSIS — I10 Essential (primary) hypertension: Secondary | ICD-10-CM | POA: Diagnosis not present

## 2018-03-22 DIAGNOSIS — N184 Chronic kidney disease, stage 4 (severe): Secondary | ICD-10-CM | POA: Diagnosis not present

## 2018-03-22 DIAGNOSIS — E119 Type 2 diabetes mellitus without complications: Secondary | ICD-10-CM | POA: Diagnosis not present

## 2018-03-22 DIAGNOSIS — I639 Cerebral infarction, unspecified: Secondary | ICD-10-CM | POA: Diagnosis not present

## 2018-03-24 DIAGNOSIS — R112 Nausea with vomiting, unspecified: Secondary | ICD-10-CM | POA: Diagnosis not present

## 2018-03-24 DIAGNOSIS — E119 Type 2 diabetes mellitus without complications: Secondary | ICD-10-CM | POA: Diagnosis not present

## 2018-03-24 DIAGNOSIS — I10 Essential (primary) hypertension: Secondary | ICD-10-CM | POA: Diagnosis not present

## 2018-03-24 DIAGNOSIS — E785 Hyperlipidemia, unspecified: Secondary | ICD-10-CM | POA: Diagnosis not present

## 2018-03-25 DIAGNOSIS — M6289 Other specified disorders of muscle: Secondary | ICD-10-CM | POA: Diagnosis not present

## 2018-03-25 DIAGNOSIS — E785 Hyperlipidemia, unspecified: Secondary | ICD-10-CM | POA: Diagnosis not present

## 2018-03-25 DIAGNOSIS — R260 Ataxic gait: Secondary | ICD-10-CM | POA: Diagnosis not present

## 2018-03-25 DIAGNOSIS — N179 Acute kidney failure, unspecified: Secondary | ICD-10-CM | POA: Diagnosis not present

## 2018-03-25 DIAGNOSIS — I509 Heart failure, unspecified: Secondary | ICD-10-CM | POA: Diagnosis not present

## 2018-03-25 DIAGNOSIS — D649 Anemia, unspecified: Secondary | ICD-10-CM | POA: Diagnosis not present

## 2018-03-25 DIAGNOSIS — I6930 Unspecified sequelae of cerebral infarction: Secondary | ICD-10-CM | POA: Diagnosis not present

## 2018-03-25 DIAGNOSIS — M6281 Muscle weakness (generalized): Secondary | ICD-10-CM | POA: Diagnosis not present

## 2018-03-25 DIAGNOSIS — Z741 Need for assistance with personal care: Secondary | ICD-10-CM | POA: Diagnosis not present

## 2018-03-25 DIAGNOSIS — R5381 Other malaise: Secondary | ICD-10-CM | POA: Diagnosis not present

## 2018-03-25 DIAGNOSIS — I7091 Generalized atherosclerosis: Secondary | ICD-10-CM | POA: Diagnosis not present

## 2018-03-26 DIAGNOSIS — I83018 Varicose veins of right lower extremity with ulcer other part of lower leg: Secondary | ICD-10-CM | POA: Diagnosis not present

## 2018-03-27 DIAGNOSIS — R260 Ataxic gait: Secondary | ICD-10-CM | POA: Diagnosis not present

## 2018-03-27 DIAGNOSIS — I6930 Unspecified sequelae of cerebral infarction: Secondary | ICD-10-CM | POA: Diagnosis not present

## 2018-03-27 DIAGNOSIS — I10 Essential (primary) hypertension: Secondary | ICD-10-CM | POA: Diagnosis not present

## 2018-03-27 DIAGNOSIS — M6281 Muscle weakness (generalized): Secondary | ICD-10-CM | POA: Diagnosis not present

## 2018-03-27 DIAGNOSIS — M6289 Other specified disorders of muscle: Secondary | ICD-10-CM | POA: Diagnosis not present

## 2018-03-27 DIAGNOSIS — N179 Acute kidney failure, unspecified: Secondary | ICD-10-CM | POA: Diagnosis not present

## 2018-03-27 DIAGNOSIS — Z741 Need for assistance with personal care: Secondary | ICD-10-CM | POA: Diagnosis not present

## 2018-03-27 DIAGNOSIS — R5381 Other malaise: Secondary | ICD-10-CM | POA: Diagnosis not present

## 2018-03-27 DIAGNOSIS — E119 Type 2 diabetes mellitus without complications: Secondary | ICD-10-CM | POA: Diagnosis not present

## 2018-03-27 DIAGNOSIS — I7091 Generalized atherosclerosis: Secondary | ICD-10-CM | POA: Diagnosis not present

## 2018-03-27 DIAGNOSIS — I509 Heart failure, unspecified: Secondary | ICD-10-CM | POA: Diagnosis not present

## 2018-03-29 DIAGNOSIS — R1032 Left lower quadrant pain: Secondary | ICD-10-CM | POA: Diagnosis not present

## 2018-03-29 DIAGNOSIS — I10 Essential (primary) hypertension: Secondary | ICD-10-CM | POA: Diagnosis not present

## 2018-03-29 DIAGNOSIS — N184 Chronic kidney disease, stage 4 (severe): Secondary | ICD-10-CM | POA: Diagnosis not present

## 2018-03-29 DIAGNOSIS — E119 Type 2 diabetes mellitus without complications: Secondary | ICD-10-CM | POA: Diagnosis not present

## 2018-03-31 DIAGNOSIS — I509 Heart failure, unspecified: Secondary | ICD-10-CM | POA: Diagnosis not present

## 2018-03-31 DIAGNOSIS — M6289 Other specified disorders of muscle: Secondary | ICD-10-CM | POA: Diagnosis not present

## 2018-03-31 DIAGNOSIS — Z741 Need for assistance with personal care: Secondary | ICD-10-CM | POA: Diagnosis not present

## 2018-03-31 DIAGNOSIS — R5381 Other malaise: Secondary | ICD-10-CM | POA: Diagnosis not present

## 2018-03-31 DIAGNOSIS — I6930 Unspecified sequelae of cerebral infarction: Secondary | ICD-10-CM | POA: Diagnosis not present

## 2018-03-31 DIAGNOSIS — M6281 Muscle weakness (generalized): Secondary | ICD-10-CM | POA: Diagnosis not present

## 2018-03-31 DIAGNOSIS — R260 Ataxic gait: Secondary | ICD-10-CM | POA: Diagnosis not present

## 2018-03-31 DIAGNOSIS — I7091 Generalized atherosclerosis: Secondary | ICD-10-CM | POA: Diagnosis not present

## 2018-04-02 DIAGNOSIS — I83028 Varicose veins of left lower extremity with ulcer other part of lower leg: Secondary | ICD-10-CM | POA: Diagnosis not present

## 2018-04-02 DIAGNOSIS — M6281 Muscle weakness (generalized): Secondary | ICD-10-CM | POA: Diagnosis not present

## 2018-04-02 DIAGNOSIS — R5381 Other malaise: Secondary | ICD-10-CM | POA: Diagnosis not present

## 2018-04-02 DIAGNOSIS — L97529 Non-pressure chronic ulcer of other part of left foot with unspecified severity: Secondary | ICD-10-CM | POA: Diagnosis not present

## 2018-04-02 DIAGNOSIS — I7091 Generalized atherosclerosis: Secondary | ICD-10-CM | POA: Diagnosis not present

## 2018-04-02 DIAGNOSIS — I509 Heart failure, unspecified: Secondary | ICD-10-CM | POA: Diagnosis not present

## 2018-04-02 DIAGNOSIS — I83018 Varicose veins of right lower extremity with ulcer other part of lower leg: Secondary | ICD-10-CM | POA: Diagnosis not present

## 2018-04-02 DIAGNOSIS — R260 Ataxic gait: Secondary | ICD-10-CM | POA: Diagnosis not present

## 2018-04-02 DIAGNOSIS — Z741 Need for assistance with personal care: Secondary | ICD-10-CM | POA: Diagnosis not present

## 2018-04-02 DIAGNOSIS — M6289 Other specified disorders of muscle: Secondary | ICD-10-CM | POA: Diagnosis not present

## 2018-04-02 DIAGNOSIS — I6930 Unspecified sequelae of cerebral infarction: Secondary | ICD-10-CM | POA: Diagnosis not present

## 2018-04-03 DIAGNOSIS — I509 Heart failure, unspecified: Secondary | ICD-10-CM | POA: Diagnosis not present

## 2018-04-03 DIAGNOSIS — I6930 Unspecified sequelae of cerebral infarction: Secondary | ICD-10-CM | POA: Diagnosis not present

## 2018-04-03 DIAGNOSIS — R5381 Other malaise: Secondary | ICD-10-CM | POA: Diagnosis not present

## 2018-04-03 DIAGNOSIS — Z741 Need for assistance with personal care: Secondary | ICD-10-CM | POA: Diagnosis not present

## 2018-04-03 DIAGNOSIS — R260 Ataxic gait: Secondary | ICD-10-CM | POA: Diagnosis not present

## 2018-04-03 DIAGNOSIS — M6289 Other specified disorders of muscle: Secondary | ICD-10-CM | POA: Diagnosis not present

## 2018-04-03 DIAGNOSIS — I7091 Generalized atherosclerosis: Secondary | ICD-10-CM | POA: Diagnosis not present

## 2018-04-03 DIAGNOSIS — M6281 Muscle weakness (generalized): Secondary | ICD-10-CM | POA: Diagnosis not present

## 2018-04-04 DIAGNOSIS — F4323 Adjustment disorder with mixed anxiety and depressed mood: Secondary | ICD-10-CM | POA: Diagnosis not present

## 2018-04-06 DIAGNOSIS — I10 Essential (primary) hypertension: Secondary | ICD-10-CM | POA: Diagnosis not present

## 2018-04-06 DIAGNOSIS — I509 Heart failure, unspecified: Secondary | ICD-10-CM | POA: Diagnosis not present

## 2018-04-06 DIAGNOSIS — E119 Type 2 diabetes mellitus without complications: Secondary | ICD-10-CM | POA: Diagnosis not present

## 2018-04-06 DIAGNOSIS — N184 Chronic kidney disease, stage 4 (severe): Secondary | ICD-10-CM | POA: Diagnosis not present

## 2018-04-09 DIAGNOSIS — R339 Retention of urine, unspecified: Secondary | ICD-10-CM | POA: Diagnosis not present

## 2018-04-09 DIAGNOSIS — N189 Chronic kidney disease, unspecified: Secondary | ICD-10-CM | POA: Diagnosis not present

## 2018-04-09 DIAGNOSIS — K5900 Constipation, unspecified: Secondary | ICD-10-CM | POA: Diagnosis not present

## 2018-04-09 DIAGNOSIS — I83028 Varicose veins of left lower extremity with ulcer other part of lower leg: Secondary | ICD-10-CM | POA: Diagnosis not present

## 2018-04-09 DIAGNOSIS — L97529 Non-pressure chronic ulcer of other part of left foot with unspecified severity: Secondary | ICD-10-CM | POA: Diagnosis not present

## 2018-04-09 DIAGNOSIS — E119 Type 2 diabetes mellitus without complications: Secondary | ICD-10-CM | POA: Diagnosis not present

## 2018-04-09 DIAGNOSIS — I83018 Varicose veins of right lower extremity with ulcer other part of lower leg: Secondary | ICD-10-CM | POA: Diagnosis not present

## 2018-04-10 DIAGNOSIS — I509 Heart failure, unspecified: Secondary | ICD-10-CM | POA: Diagnosis not present

## 2018-04-10 DIAGNOSIS — Z741 Need for assistance with personal care: Secondary | ICD-10-CM | POA: Diagnosis not present

## 2018-04-10 DIAGNOSIS — R1030 Lower abdominal pain, unspecified: Secondary | ICD-10-CM | POA: Diagnosis not present

## 2018-04-10 DIAGNOSIS — R338 Other retention of urine: Secondary | ICD-10-CM | POA: Diagnosis not present

## 2018-04-10 DIAGNOSIS — I6930 Unspecified sequelae of cerebral infarction: Secondary | ICD-10-CM | POA: Diagnosis not present

## 2018-04-10 DIAGNOSIS — R260 Ataxic gait: Secondary | ICD-10-CM | POA: Diagnosis not present

## 2018-04-10 DIAGNOSIS — M6281 Muscle weakness (generalized): Secondary | ICD-10-CM | POA: Diagnosis not present

## 2018-04-10 DIAGNOSIS — R5381 Other malaise: Secondary | ICD-10-CM | POA: Diagnosis not present

## 2018-04-10 DIAGNOSIS — I7091 Generalized atherosclerosis: Secondary | ICD-10-CM | POA: Diagnosis not present

## 2018-04-10 DIAGNOSIS — M6289 Other specified disorders of muscle: Secondary | ICD-10-CM | POA: Diagnosis not present

## 2018-04-10 DIAGNOSIS — R3914 Feeling of incomplete bladder emptying: Secondary | ICD-10-CM | POA: Diagnosis not present

## 2018-04-11 DIAGNOSIS — R339 Retention of urine, unspecified: Secondary | ICD-10-CM | POA: Diagnosis not present

## 2018-04-11 DIAGNOSIS — N179 Acute kidney failure, unspecified: Secondary | ICD-10-CM | POA: Diagnosis not present

## 2018-04-11 DIAGNOSIS — E785 Hyperlipidemia, unspecified: Secondary | ICD-10-CM | POA: Diagnosis not present

## 2018-04-11 DIAGNOSIS — N189 Chronic kidney disease, unspecified: Secondary | ICD-10-CM | POA: Diagnosis not present

## 2018-04-11 DIAGNOSIS — K5900 Constipation, unspecified: Secondary | ICD-10-CM | POA: Diagnosis not present

## 2018-04-11 DIAGNOSIS — D649 Anemia, unspecified: Secondary | ICD-10-CM | POA: Diagnosis not present

## 2018-04-11 DIAGNOSIS — E119 Type 2 diabetes mellitus without complications: Secondary | ICD-10-CM | POA: Diagnosis not present

## 2018-04-11 DIAGNOSIS — I509 Heart failure, unspecified: Secondary | ICD-10-CM | POA: Diagnosis not present

## 2018-04-11 DIAGNOSIS — I429 Cardiomyopathy, unspecified: Secondary | ICD-10-CM | POA: Diagnosis not present

## 2018-04-11 DIAGNOSIS — I10 Essential (primary) hypertension: Secondary | ICD-10-CM | POA: Diagnosis not present

## 2018-04-13 DIAGNOSIS — E785 Hyperlipidemia, unspecified: Secondary | ICD-10-CM | POA: Diagnosis not present

## 2018-04-13 DIAGNOSIS — I509 Heart failure, unspecified: Secondary | ICD-10-CM | POA: Diagnosis not present

## 2018-04-13 DIAGNOSIS — K5900 Constipation, unspecified: Secondary | ICD-10-CM | POA: Diagnosis not present

## 2018-04-13 DIAGNOSIS — D649 Anemia, unspecified: Secondary | ICD-10-CM | POA: Diagnosis not present

## 2018-04-13 DIAGNOSIS — N390 Urinary tract infection, site not specified: Secondary | ICD-10-CM | POA: Diagnosis not present

## 2018-04-13 DIAGNOSIS — I429 Cardiomyopathy, unspecified: Secondary | ICD-10-CM | POA: Diagnosis not present

## 2018-04-13 DIAGNOSIS — E119 Type 2 diabetes mellitus without complications: Secondary | ICD-10-CM | POA: Diagnosis not present

## 2018-04-13 DIAGNOSIS — I10 Essential (primary) hypertension: Secondary | ICD-10-CM | POA: Diagnosis not present

## 2018-04-13 DIAGNOSIS — N184 Chronic kidney disease, stage 4 (severe): Secondary | ICD-10-CM | POA: Diagnosis not present

## 2018-04-14 DIAGNOSIS — L97919 Non-pressure chronic ulcer of unspecified part of right lower leg with unspecified severity: Secondary | ICD-10-CM | POA: Diagnosis not present

## 2018-04-14 DIAGNOSIS — D631 Anemia in chronic kidney disease: Secondary | ICD-10-CM | POA: Diagnosis not present

## 2018-04-14 DIAGNOSIS — N184 Chronic kidney disease, stage 4 (severe): Secondary | ICD-10-CM | POA: Diagnosis not present

## 2018-04-14 DIAGNOSIS — L97529 Non-pressure chronic ulcer of other part of left foot with unspecified severity: Secondary | ICD-10-CM | POA: Diagnosis not present

## 2018-04-14 DIAGNOSIS — I509 Heart failure, unspecified: Secondary | ICD-10-CM | POA: Diagnosis not present

## 2018-04-14 DIAGNOSIS — E1122 Type 2 diabetes mellitus with diabetic chronic kidney disease: Secondary | ICD-10-CM | POA: Diagnosis not present

## 2018-04-14 DIAGNOSIS — E11621 Type 2 diabetes mellitus with foot ulcer: Secondary | ICD-10-CM | POA: Diagnosis not present

## 2018-04-14 DIAGNOSIS — I130 Hypertensive heart and chronic kidney disease with heart failure and stage 1 through stage 4 chronic kidney disease, or unspecified chronic kidney disease: Secondary | ICD-10-CM | POA: Diagnosis not present

## 2018-04-14 DIAGNOSIS — I429 Cardiomyopathy, unspecified: Secondary | ICD-10-CM | POA: Diagnosis not present

## 2018-04-14 DIAGNOSIS — Z8673 Personal history of transient ischemic attack (TIA), and cerebral infarction without residual deficits: Secondary | ICD-10-CM | POA: Diagnosis not present

## 2018-04-14 DIAGNOSIS — R601 Generalized edema: Secondary | ICD-10-CM | POA: Diagnosis not present

## 2018-04-14 DIAGNOSIS — I2510 Atherosclerotic heart disease of native coronary artery without angina pectoris: Secondary | ICD-10-CM | POA: Diagnosis not present

## 2018-04-14 DIAGNOSIS — Z955 Presence of coronary angioplasty implant and graft: Secondary | ICD-10-CM | POA: Diagnosis not present

## 2018-04-14 DIAGNOSIS — Z7984 Long term (current) use of oral hypoglycemic drugs: Secondary | ICD-10-CM | POA: Diagnosis not present

## 2018-04-14 DIAGNOSIS — I252 Old myocardial infarction: Secondary | ICD-10-CM | POA: Diagnosis not present

## 2018-04-14 DIAGNOSIS — E1151 Type 2 diabetes mellitus with diabetic peripheral angiopathy without gangrene: Secondary | ICD-10-CM | POA: Diagnosis not present

## 2018-04-28 DIAGNOSIS — R339 Retention of urine, unspecified: Secondary | ICD-10-CM | POA: Diagnosis not present

## 2018-04-28 DIAGNOSIS — Z125 Encounter for screening for malignant neoplasm of prostate: Secondary | ICD-10-CM | POA: Diagnosis not present

## 2018-04-28 DIAGNOSIS — N529 Male erectile dysfunction, unspecified: Secondary | ICD-10-CM | POA: Diagnosis not present

## 2018-04-30 DIAGNOSIS — E11621 Type 2 diabetes mellitus with foot ulcer: Secondary | ICD-10-CM | POA: Diagnosis not present

## 2018-04-30 DIAGNOSIS — E1165 Type 2 diabetes mellitus with hyperglycemia: Secondary | ICD-10-CM | POA: Diagnosis not present

## 2018-04-30 DIAGNOSIS — Z713 Dietary counseling and surveillance: Secondary | ICD-10-CM | POA: Diagnosis not present

## 2018-04-30 DIAGNOSIS — Z719 Counseling, unspecified: Secondary | ICD-10-CM | POA: Diagnosis not present

## 2018-04-30 DIAGNOSIS — E11610 Type 2 diabetes mellitus with diabetic neuropathic arthropathy: Secondary | ICD-10-CM | POA: Diagnosis not present

## 2018-05-08 DIAGNOSIS — R399 Unspecified symptoms and signs involving the genitourinary system: Secondary | ICD-10-CM | POA: Diagnosis not present

## 2018-05-08 DIAGNOSIS — R310 Gross hematuria: Secondary | ICD-10-CM | POA: Diagnosis not present

## 2018-05-14 DIAGNOSIS — R601 Generalized edema: Secondary | ICD-10-CM | POA: Diagnosis not present

## 2018-05-14 DIAGNOSIS — E11621 Type 2 diabetes mellitus with foot ulcer: Secondary | ICD-10-CM | POA: Diagnosis not present

## 2018-05-14 DIAGNOSIS — I509 Heart failure, unspecified: Secondary | ICD-10-CM | POA: Diagnosis not present

## 2018-05-14 DIAGNOSIS — I252 Old myocardial infarction: Secondary | ICD-10-CM | POA: Diagnosis not present

## 2018-05-14 DIAGNOSIS — E1151 Type 2 diabetes mellitus with diabetic peripheral angiopathy without gangrene: Secondary | ICD-10-CM | POA: Diagnosis not present

## 2018-05-14 DIAGNOSIS — Z955 Presence of coronary angioplasty implant and graft: Secondary | ICD-10-CM | POA: Diagnosis not present

## 2018-05-14 DIAGNOSIS — I2510 Atherosclerotic heart disease of native coronary artery without angina pectoris: Secondary | ICD-10-CM | POA: Diagnosis not present

## 2018-05-14 DIAGNOSIS — E1122 Type 2 diabetes mellitus with diabetic chronic kidney disease: Secondary | ICD-10-CM | POA: Diagnosis not present

## 2018-05-14 DIAGNOSIS — L97529 Non-pressure chronic ulcer of other part of left foot with unspecified severity: Secondary | ICD-10-CM | POA: Diagnosis not present

## 2018-05-14 DIAGNOSIS — N184 Chronic kidney disease, stage 4 (severe): Secondary | ICD-10-CM | POA: Diagnosis not present

## 2018-05-14 DIAGNOSIS — D631 Anemia in chronic kidney disease: Secondary | ICD-10-CM | POA: Diagnosis not present

## 2018-05-14 DIAGNOSIS — I130 Hypertensive heart and chronic kidney disease with heart failure and stage 1 through stage 4 chronic kidney disease, or unspecified chronic kidney disease: Secondary | ICD-10-CM | POA: Diagnosis not present

## 2018-05-14 DIAGNOSIS — L97919 Non-pressure chronic ulcer of unspecified part of right lower leg with unspecified severity: Secondary | ICD-10-CM | POA: Diagnosis not present

## 2018-05-14 DIAGNOSIS — I429 Cardiomyopathy, unspecified: Secondary | ICD-10-CM | POA: Diagnosis not present

## 2018-05-14 DIAGNOSIS — Z8673 Personal history of transient ischemic attack (TIA), and cerebral infarction without residual deficits: Secondary | ICD-10-CM | POA: Diagnosis not present

## 2018-05-14 DIAGNOSIS — Z7984 Long term (current) use of oral hypoglycemic drugs: Secondary | ICD-10-CM | POA: Diagnosis not present

## 2018-05-21 DIAGNOSIS — I10 Essential (primary) hypertension: Secondary | ICD-10-CM | POA: Diagnosis not present

## 2018-05-21 DIAGNOSIS — Z125 Encounter for screening for malignant neoplasm of prostate: Secondary | ICD-10-CM | POA: Diagnosis not present

## 2018-05-21 DIAGNOSIS — N529 Male erectile dysfunction, unspecified: Secondary | ICD-10-CM | POA: Diagnosis not present

## 2018-05-21 DIAGNOSIS — N184 Chronic kidney disease, stage 4 (severe): Secondary | ICD-10-CM | POA: Diagnosis not present

## 2018-05-21 DIAGNOSIS — R350 Frequency of micturition: Secondary | ICD-10-CM | POA: Diagnosis not present

## 2018-05-21 DIAGNOSIS — N390 Urinary tract infection, site not specified: Secondary | ICD-10-CM | POA: Diagnosis not present

## 2018-05-28 DIAGNOSIS — N390 Urinary tract infection, site not specified: Secondary | ICD-10-CM | POA: Diagnosis not present

## 2018-06-14 DIAGNOSIS — E1165 Type 2 diabetes mellitus with hyperglycemia: Secondary | ICD-10-CM | POA: Diagnosis not present

## 2018-06-14 DIAGNOSIS — R609 Edema, unspecified: Secondary | ICD-10-CM | POA: Diagnosis not present

## 2018-06-17 DIAGNOSIS — D649 Anemia, unspecified: Secondary | ICD-10-CM | POA: Diagnosis not present

## 2018-06-17 DIAGNOSIS — N289 Disorder of kidney and ureter, unspecified: Secondary | ICD-10-CM | POA: Diagnosis not present

## 2018-06-17 DIAGNOSIS — I255 Ischemic cardiomyopathy: Secondary | ICD-10-CM | POA: Diagnosis not present

## 2018-06-17 DIAGNOSIS — I501 Left ventricular failure: Secondary | ICD-10-CM | POA: Diagnosis not present

## 2018-06-17 DIAGNOSIS — Z743 Need for continuous supervision: Secondary | ICD-10-CM | POA: Diagnosis not present

## 2018-06-17 DIAGNOSIS — R0789 Other chest pain: Secondary | ICD-10-CM | POA: Diagnosis not present

## 2018-06-17 DIAGNOSIS — R079 Chest pain, unspecified: Secondary | ICD-10-CM | POA: Diagnosis not present

## 2018-06-17 DIAGNOSIS — I509 Heart failure, unspecified: Secondary | ICD-10-CM | POA: Diagnosis not present

## 2018-06-17 DIAGNOSIS — R600 Localized edema: Secondary | ICD-10-CM | POA: Diagnosis not present

## 2018-06-18 DIAGNOSIS — I509 Heart failure, unspecified: Secondary | ICD-10-CM | POA: Diagnosis not present

## 2018-06-18 DIAGNOSIS — D649 Anemia, unspecified: Secondary | ICD-10-CM | POA: Diagnosis not present

## 2018-06-18 DIAGNOSIS — N184 Chronic kidney disease, stage 4 (severe): Secondary | ICD-10-CM | POA: Diagnosis not present

## 2018-06-18 DIAGNOSIS — R05 Cough: Secondary | ICD-10-CM | POA: Diagnosis not present

## 2018-06-18 DIAGNOSIS — I255 Ischemic cardiomyopathy: Secondary | ICD-10-CM | POA: Diagnosis not present

## 2018-06-18 DIAGNOSIS — R079 Chest pain, unspecified: Secondary | ICD-10-CM | POA: Diagnosis not present

## 2018-07-01 DIAGNOSIS — I5042 Chronic combined systolic (congestive) and diastolic (congestive) heart failure: Secondary | ICD-10-CM | POA: Diagnosis not present

## 2018-07-01 DIAGNOSIS — I255 Ischemic cardiomyopathy: Secondary | ICD-10-CM | POA: Diagnosis not present

## 2018-07-01 DIAGNOSIS — N19 Unspecified kidney failure: Secondary | ICD-10-CM | POA: Diagnosis not present

## 2018-07-01 DIAGNOSIS — R072 Precordial pain: Secondary | ICD-10-CM | POA: Diagnosis not present

## 2018-07-28 DIAGNOSIS — Z9114 Patient's other noncompliance with medication regimen: Secondary | ICD-10-CM | POA: Diagnosis not present

## 2018-07-28 DIAGNOSIS — E1165 Type 2 diabetes mellitus with hyperglycemia: Secondary | ICD-10-CM | POA: Diagnosis not present

## 2018-07-28 DIAGNOSIS — Z760 Encounter for issue of repeat prescription: Secondary | ICD-10-CM | POA: Diagnosis not present

## 2018-07-28 DIAGNOSIS — I519 Heart disease, unspecified: Secondary | ICD-10-CM | POA: Diagnosis not present

## 2018-08-08 DIAGNOSIS — E663 Overweight: Secondary | ICD-10-CM | POA: Diagnosis not present

## 2018-08-13 DIAGNOSIS — E1165 Type 2 diabetes mellitus with hyperglycemia: Secondary | ICD-10-CM | POA: Diagnosis not present

## 2018-08-13 DIAGNOSIS — E11621 Type 2 diabetes mellitus with foot ulcer: Secondary | ICD-10-CM | POA: Diagnosis not present

## 2018-08-13 DIAGNOSIS — Z599 Problem related to housing and economic circumstances, unspecified: Secondary | ICD-10-CM | POA: Diagnosis not present

## 2018-08-13 DIAGNOSIS — Z719 Counseling, unspecified: Secondary | ICD-10-CM | POA: Diagnosis not present

## 2018-08-25 DIAGNOSIS — I10 Essential (primary) hypertension: Secondary | ICD-10-CM | POA: Diagnosis not present

## 2018-08-25 DIAGNOSIS — I209 Angina pectoris, unspecified: Secondary | ICD-10-CM | POA: Diagnosis not present

## 2018-08-25 DIAGNOSIS — I739 Peripheral vascular disease, unspecified: Secondary | ICD-10-CM | POA: Diagnosis not present

## 2018-08-25 DIAGNOSIS — M79606 Pain in leg, unspecified: Secondary | ICD-10-CM | POA: Diagnosis not present

## 2018-08-25 DIAGNOSIS — I509 Heart failure, unspecified: Secondary | ICD-10-CM | POA: Diagnosis not present

## 2018-08-29 DIAGNOSIS — R002 Palpitations: Secondary | ICD-10-CM | POA: Diagnosis not present

## 2018-08-29 DIAGNOSIS — I509 Heart failure, unspecified: Secondary | ICD-10-CM | POA: Diagnosis not present

## 2018-08-29 DIAGNOSIS — I209 Angina pectoris, unspecified: Secondary | ICD-10-CM | POA: Diagnosis not present

## 2018-08-29 DIAGNOSIS — I10 Essential (primary) hypertension: Secondary | ICD-10-CM | POA: Diagnosis not present

## 2018-08-29 DIAGNOSIS — I739 Peripheral vascular disease, unspecified: Secondary | ICD-10-CM | POA: Diagnosis not present

## 2018-08-29 DIAGNOSIS — M79606 Pain in leg, unspecified: Secondary | ICD-10-CM | POA: Diagnosis not present

## 2018-09-03 DIAGNOSIS — E876 Hypokalemia: Secondary | ICD-10-CM | POA: Diagnosis present

## 2018-09-03 DIAGNOSIS — N189 Chronic kidney disease, unspecified: Secondary | ICD-10-CM | POA: Diagnosis not present

## 2018-09-03 DIAGNOSIS — E1122 Type 2 diabetes mellitus with diabetic chronic kidney disease: Secondary | ICD-10-CM | POA: Diagnosis not present

## 2018-09-03 DIAGNOSIS — F419 Anxiety disorder, unspecified: Secondary | ICD-10-CM | POA: Diagnosis present

## 2018-09-03 DIAGNOSIS — I255 Ischemic cardiomyopathy: Secondary | ICD-10-CM | POA: Diagnosis present

## 2018-09-03 DIAGNOSIS — Z794 Long term (current) use of insulin: Secondary | ICD-10-CM | POA: Diagnosis not present

## 2018-09-03 DIAGNOSIS — Z743 Need for continuous supervision: Secondary | ICD-10-CM | POA: Diagnosis not present

## 2018-09-03 DIAGNOSIS — I130 Hypertensive heart and chronic kidney disease with heart failure and stage 1 through stage 4 chronic kidney disease, or unspecified chronic kidney disease: Secondary | ICD-10-CM | POA: Diagnosis not present

## 2018-09-03 DIAGNOSIS — I071 Rheumatic tricuspid insufficiency: Secondary | ICD-10-CM | POA: Diagnosis present

## 2018-09-03 DIAGNOSIS — N184 Chronic kidney disease, stage 4 (severe): Secondary | ICD-10-CM | POA: Diagnosis present

## 2018-09-03 DIAGNOSIS — I252 Old myocardial infarction: Secondary | ICD-10-CM | POA: Diagnosis not present

## 2018-09-03 DIAGNOSIS — I700 Atherosclerosis of aorta: Secondary | ICD-10-CM | POA: Diagnosis present

## 2018-09-03 DIAGNOSIS — R7989 Other specified abnormal findings of blood chemistry: Secondary | ICD-10-CM | POA: Diagnosis not present

## 2018-09-03 DIAGNOSIS — R799 Abnormal finding of blood chemistry, unspecified: Secondary | ICD-10-CM | POA: Diagnosis not present

## 2018-09-03 DIAGNOSIS — M79605 Pain in left leg: Secondary | ICD-10-CM | POA: Diagnosis not present

## 2018-09-03 DIAGNOSIS — I209 Angina pectoris, unspecified: Secondary | ICD-10-CM | POA: Diagnosis not present

## 2018-09-03 DIAGNOSIS — R6889 Other general symptoms and signs: Secondary | ICD-10-CM | POA: Diagnosis not present

## 2018-09-03 DIAGNOSIS — Z955 Presence of coronary angioplasty implant and graft: Secondary | ICD-10-CM | POA: Diagnosis not present

## 2018-09-03 DIAGNOSIS — D638 Anemia in other chronic diseases classified elsewhere: Secondary | ICD-10-CM | POA: Diagnosis not present

## 2018-09-03 DIAGNOSIS — M79604 Pain in right leg: Secondary | ICD-10-CM | POA: Diagnosis not present

## 2018-09-03 DIAGNOSIS — M7989 Other specified soft tissue disorders: Secondary | ICD-10-CM | POA: Diagnosis not present

## 2018-09-03 DIAGNOSIS — I69354 Hemiplegia and hemiparesis following cerebral infarction affecting left non-dominant side: Secondary | ICD-10-CM | POA: Diagnosis not present

## 2018-09-03 DIAGNOSIS — K219 Gastro-esophageal reflux disease without esophagitis: Secondary | ICD-10-CM | POA: Diagnosis present

## 2018-09-03 DIAGNOSIS — R7309 Other abnormal glucose: Secondary | ICD-10-CM | POA: Diagnosis not present

## 2018-09-03 DIAGNOSIS — I248 Other forms of acute ischemic heart disease: Secondary | ICD-10-CM | POA: Diagnosis not present

## 2018-09-03 DIAGNOSIS — I25119 Atherosclerotic heart disease of native coronary artery with unspecified angina pectoris: Secondary | ICD-10-CM | POA: Diagnosis not present

## 2018-09-03 DIAGNOSIS — F17210 Nicotine dependence, cigarettes, uncomplicated: Secondary | ICD-10-CM | POA: Diagnosis present

## 2018-09-03 DIAGNOSIS — I493 Ventricular premature depolarization: Secondary | ICD-10-CM | POA: Diagnosis present

## 2018-09-03 DIAGNOSIS — Z7902 Long term (current) use of antithrombotics/antiplatelets: Secondary | ICD-10-CM | POA: Diagnosis not present

## 2018-09-03 DIAGNOSIS — Z7982 Long term (current) use of aspirin: Secondary | ICD-10-CM | POA: Diagnosis not present

## 2018-09-03 DIAGNOSIS — R778 Other specified abnormalities of plasma proteins: Secondary | ICD-10-CM | POA: Diagnosis not present

## 2018-09-03 DIAGNOSIS — Z8249 Family history of ischemic heart disease and other diseases of the circulatory system: Secondary | ICD-10-CM | POA: Diagnosis not present

## 2018-09-03 DIAGNOSIS — E785 Hyperlipidemia, unspecified: Secondary | ICD-10-CM | POA: Diagnosis present

## 2018-09-03 DIAGNOSIS — I200 Unstable angina: Secondary | ICD-10-CM | POA: Diagnosis not present

## 2018-09-03 DIAGNOSIS — I5020 Unspecified systolic (congestive) heart failure: Secondary | ICD-10-CM | POA: Diagnosis present

## 2018-09-03 DIAGNOSIS — D649 Anemia, unspecified: Secondary | ICD-10-CM | POA: Diagnosis not present

## 2018-09-03 DIAGNOSIS — R0789 Other chest pain: Secondary | ICD-10-CM | POA: Diagnosis not present

## 2018-09-03 DIAGNOSIS — R945 Abnormal results of liver function studies: Secondary | ICD-10-CM | POA: Diagnosis not present

## 2018-09-03 DIAGNOSIS — R946 Abnormal results of thyroid function studies: Secondary | ICD-10-CM | POA: Diagnosis not present

## 2018-09-03 DIAGNOSIS — Z833 Family history of diabetes mellitus: Secondary | ICD-10-CM | POA: Diagnosis not present

## 2018-09-03 DIAGNOSIS — R079 Chest pain, unspecified: Secondary | ICD-10-CM | POA: Diagnosis not present

## 2018-09-03 DIAGNOSIS — E1151 Type 2 diabetes mellitus with diabetic peripheral angiopathy without gangrene: Secondary | ICD-10-CM | POA: Diagnosis present

## 2018-09-03 DIAGNOSIS — E612 Magnesium deficiency: Secondary | ICD-10-CM | POA: Diagnosis not present

## 2018-09-03 DIAGNOSIS — Z79899 Other long term (current) drug therapy: Secondary | ICD-10-CM | POA: Diagnosis not present

## 2018-09-04 DIAGNOSIS — R079 Chest pain, unspecified: Secondary | ICD-10-CM | POA: Diagnosis not present

## 2018-09-16 DIAGNOSIS — E1165 Type 2 diabetes mellitus with hyperglycemia: Secondary | ICD-10-CM | POA: Diagnosis not present

## 2018-09-16 DIAGNOSIS — R609 Edema, unspecified: Secondary | ICD-10-CM | POA: Diagnosis not present

## 2018-10-07 DIAGNOSIS — E1165 Type 2 diabetes mellitus with hyperglycemia: Secondary | ICD-10-CM | POA: Diagnosis not present

## 2018-10-07 DIAGNOSIS — I2510 Atherosclerotic heart disease of native coronary artery without angina pectoris: Secondary | ICD-10-CM | POA: Diagnosis not present

## 2018-10-16 DIAGNOSIS — E1165 Type 2 diabetes mellitus with hyperglycemia: Secondary | ICD-10-CM | POA: Diagnosis not present

## 2018-10-16 DIAGNOSIS — E663 Overweight: Secondary | ICD-10-CM | POA: Diagnosis not present

## 2018-10-16 DIAGNOSIS — R609 Edema, unspecified: Secondary | ICD-10-CM | POA: Diagnosis not present

## 2018-10-18 DIAGNOSIS — N186 End stage renal disease: Secondary | ICD-10-CM | POA: Diagnosis not present

## 2018-10-25 DIAGNOSIS — Z955 Presence of coronary angioplasty implant and graft: Secondary | ICD-10-CM | POA: Diagnosis not present

## 2018-10-25 DIAGNOSIS — Z7982 Long term (current) use of aspirin: Secondary | ICD-10-CM | POA: Diagnosis not present

## 2018-10-25 DIAGNOSIS — I739 Peripheral vascular disease, unspecified: Secondary | ICD-10-CM | POA: Diagnosis not present

## 2018-10-25 DIAGNOSIS — I132 Hypertensive heart and chronic kidney disease with heart failure and with stage 5 chronic kidney disease, or end stage renal disease: Secondary | ICD-10-CM | POA: Diagnosis present

## 2018-10-25 DIAGNOSIS — E119 Type 2 diabetes mellitus without complications: Secondary | ICD-10-CM | POA: Diagnosis not present

## 2018-10-25 DIAGNOSIS — R0602 Shortness of breath: Secondary | ICD-10-CM | POA: Diagnosis not present

## 2018-10-25 DIAGNOSIS — J9601 Acute respiratory failure with hypoxia: Secondary | ICD-10-CM | POA: Diagnosis not present

## 2018-10-25 DIAGNOSIS — Z9989 Dependence on other enabling machines and devices: Secondary | ICD-10-CM | POA: Diagnosis not present

## 2018-10-25 DIAGNOSIS — R339 Retention of urine, unspecified: Secondary | ICD-10-CM | POA: Diagnosis not present

## 2018-10-25 DIAGNOSIS — N289 Disorder of kidney and ureter, unspecified: Secondary | ICD-10-CM | POA: Diagnosis not present

## 2018-10-25 DIAGNOSIS — I509 Heart failure, unspecified: Secondary | ICD-10-CM | POA: Diagnosis not present

## 2018-10-25 DIAGNOSIS — I69354 Hemiplegia and hemiparesis following cerebral infarction affecting left non-dominant side: Secondary | ICD-10-CM | POA: Diagnosis not present

## 2018-10-25 DIAGNOSIS — I2510 Atherosclerotic heart disease of native coronary artery without angina pectoris: Secondary | ICD-10-CM | POA: Diagnosis not present

## 2018-10-25 DIAGNOSIS — Z8249 Family history of ischemic heart disease and other diseases of the circulatory system: Secondary | ICD-10-CM | POA: Diagnosis not present

## 2018-10-25 DIAGNOSIS — I272 Pulmonary hypertension, unspecified: Secondary | ICD-10-CM | POA: Diagnosis present

## 2018-10-25 DIAGNOSIS — I200 Unstable angina: Secondary | ICD-10-CM | POA: Diagnosis not present

## 2018-10-25 DIAGNOSIS — R0603 Acute respiratory distress: Secondary | ICD-10-CM | POA: Diagnosis not present

## 2018-10-25 DIAGNOSIS — K219 Gastro-esophageal reflux disease without esophagitis: Secondary | ICD-10-CM | POA: Diagnosis present

## 2018-10-25 DIAGNOSIS — I361 Nonrheumatic tricuspid (valve) insufficiency: Secondary | ICD-10-CM | POA: Diagnosis not present

## 2018-10-25 DIAGNOSIS — N184 Chronic kidney disease, stage 4 (severe): Secondary | ICD-10-CM | POA: Diagnosis not present

## 2018-10-25 DIAGNOSIS — I5023 Acute on chronic systolic (congestive) heart failure: Secondary | ICD-10-CM | POA: Diagnosis present

## 2018-10-25 DIAGNOSIS — I340 Nonrheumatic mitral (valve) insufficiency: Secondary | ICD-10-CM | POA: Diagnosis not present

## 2018-10-25 DIAGNOSIS — R600 Localized edema: Secondary | ICD-10-CM | POA: Diagnosis not present

## 2018-10-25 DIAGNOSIS — R0789 Other chest pain: Secondary | ICD-10-CM | POA: Diagnosis not present

## 2018-10-25 DIAGNOSIS — D631 Anemia in chronic kidney disease: Secondary | ICD-10-CM | POA: Diagnosis not present

## 2018-10-25 DIAGNOSIS — Z992 Dependence on renal dialysis: Secondary | ICD-10-CM | POA: Diagnosis not present

## 2018-10-25 DIAGNOSIS — I081 Rheumatic disorders of both mitral and tricuspid valves: Secondary | ICD-10-CM | POA: Diagnosis present

## 2018-10-25 DIAGNOSIS — I25110 Atherosclerotic heart disease of native coronary artery with unstable angina pectoris: Secondary | ICD-10-CM | POA: Diagnosis present

## 2018-10-25 DIAGNOSIS — N189 Chronic kidney disease, unspecified: Secondary | ICD-10-CM | POA: Diagnosis not present

## 2018-10-25 DIAGNOSIS — I252 Old myocardial infarction: Secondary | ICD-10-CM | POA: Diagnosis not present

## 2018-10-25 DIAGNOSIS — T82855A Stenosis of coronary artery stent, initial encounter: Secondary | ICD-10-CM | POA: Diagnosis present

## 2018-10-25 DIAGNOSIS — Z03818 Encounter for observation for suspected exposure to other biological agents ruled out: Secondary | ICD-10-CM | POA: Diagnosis not present

## 2018-10-25 DIAGNOSIS — Z20828 Contact with and (suspected) exposure to other viral communicable diseases: Secondary | ICD-10-CM | POA: Diagnosis present

## 2018-10-25 DIAGNOSIS — E1122 Type 2 diabetes mellitus with diabetic chronic kidney disease: Secondary | ICD-10-CM | POA: Diagnosis present

## 2018-10-25 DIAGNOSIS — I248 Other forms of acute ischemic heart disease: Secondary | ICD-10-CM | POA: Diagnosis present

## 2018-10-25 DIAGNOSIS — Z4901 Encounter for fitting and adjustment of extracorporeal dialysis catheter: Secondary | ICD-10-CM | POA: Diagnosis not present

## 2018-10-25 DIAGNOSIS — D649 Anemia, unspecified: Secondary | ICD-10-CM | POA: Diagnosis not present

## 2018-10-25 DIAGNOSIS — N186 End stage renal disease: Secondary | ICD-10-CM | POA: Diagnosis present

## 2018-10-25 DIAGNOSIS — F4322 Adjustment disorder with anxiety: Secondary | ICD-10-CM | POA: Diagnosis not present

## 2018-10-25 DIAGNOSIS — J9621 Acute and chronic respiratory failure with hypoxia: Secondary | ICD-10-CM | POA: Diagnosis present

## 2018-10-25 DIAGNOSIS — R079 Chest pain, unspecified: Secondary | ICD-10-CM | POA: Diagnosis not present

## 2018-10-25 DIAGNOSIS — N185 Chronic kidney disease, stage 5: Secondary | ICD-10-CM | POA: Diagnosis not present

## 2018-10-25 DIAGNOSIS — Z91041 Radiographic dye allergy status: Secondary | ICD-10-CM | POA: Diagnosis not present

## 2018-10-25 DIAGNOSIS — I25119 Atherosclerotic heart disease of native coronary artery with unspecified angina pectoris: Secondary | ICD-10-CM | POA: Diagnosis not present

## 2018-10-25 DIAGNOSIS — E872 Acidosis: Secondary | ICD-10-CM | POA: Diagnosis present

## 2018-10-25 DIAGNOSIS — I255 Ischemic cardiomyopathy: Secondary | ICD-10-CM | POA: Diagnosis present

## 2018-10-25 DIAGNOSIS — Z95810 Presence of automatic (implantable) cardiac defibrillator: Secondary | ICD-10-CM | POA: Diagnosis not present

## 2018-10-25 DIAGNOSIS — M2578 Osteophyte, vertebrae: Secondary | ICD-10-CM | POA: Diagnosis present

## 2018-10-25 DIAGNOSIS — K5900 Constipation, unspecified: Secondary | ICD-10-CM | POA: Diagnosis not present

## 2018-10-25 DIAGNOSIS — I10 Essential (primary) hypertension: Secondary | ICD-10-CM | POA: Diagnosis not present

## 2018-10-25 DIAGNOSIS — I639 Cerebral infarction, unspecified: Secondary | ICD-10-CM | POA: Diagnosis not present

## 2018-11-03 DIAGNOSIS — R609 Edema, unspecified: Secondary | ICD-10-CM | POA: Diagnosis not present

## 2018-11-03 DIAGNOSIS — E1165 Type 2 diabetes mellitus with hyperglycemia: Secondary | ICD-10-CM | POA: Diagnosis not present

## 2018-11-05 DIAGNOSIS — Z992 Dependence on renal dialysis: Secondary | ICD-10-CM | POA: Diagnosis not present

## 2018-11-05 DIAGNOSIS — N2581 Secondary hyperparathyroidism of renal origin: Secondary | ICD-10-CM | POA: Diagnosis not present

## 2018-11-05 DIAGNOSIS — D631 Anemia in chronic kidney disease: Secondary | ICD-10-CM | POA: Diagnosis not present

## 2018-11-05 DIAGNOSIS — N186 End stage renal disease: Secondary | ICD-10-CM | POA: Diagnosis not present

## 2018-11-05 DIAGNOSIS — D509 Iron deficiency anemia, unspecified: Secondary | ICD-10-CM | POA: Diagnosis not present

## 2018-11-07 DIAGNOSIS — N186 End stage renal disease: Secondary | ICD-10-CM | POA: Diagnosis not present

## 2018-11-07 DIAGNOSIS — D631 Anemia in chronic kidney disease: Secondary | ICD-10-CM | POA: Diagnosis not present

## 2018-11-07 DIAGNOSIS — D509 Iron deficiency anemia, unspecified: Secondary | ICD-10-CM | POA: Diagnosis not present

## 2018-11-07 DIAGNOSIS — N2581 Secondary hyperparathyroidism of renal origin: Secondary | ICD-10-CM | POA: Diagnosis not present

## 2018-11-07 DIAGNOSIS — Z992 Dependence on renal dialysis: Secondary | ICD-10-CM | POA: Diagnosis not present

## 2018-11-10 DIAGNOSIS — D509 Iron deficiency anemia, unspecified: Secondary | ICD-10-CM | POA: Diagnosis not present

## 2018-11-10 DIAGNOSIS — Z992 Dependence on renal dialysis: Secondary | ICD-10-CM | POA: Diagnosis not present

## 2018-11-10 DIAGNOSIS — N2581 Secondary hyperparathyroidism of renal origin: Secondary | ICD-10-CM | POA: Diagnosis not present

## 2018-11-10 DIAGNOSIS — N186 End stage renal disease: Secondary | ICD-10-CM | POA: Diagnosis not present

## 2018-11-10 DIAGNOSIS — D631 Anemia in chronic kidney disease: Secondary | ICD-10-CM | POA: Diagnosis not present

## 2018-11-12 DIAGNOSIS — D631 Anemia in chronic kidney disease: Secondary | ICD-10-CM | POA: Diagnosis not present

## 2018-11-12 DIAGNOSIS — N186 End stage renal disease: Secondary | ICD-10-CM | POA: Diagnosis not present

## 2018-11-12 DIAGNOSIS — D509 Iron deficiency anemia, unspecified: Secondary | ICD-10-CM | POA: Diagnosis not present

## 2018-11-12 DIAGNOSIS — N2581 Secondary hyperparathyroidism of renal origin: Secondary | ICD-10-CM | POA: Diagnosis not present

## 2018-11-12 DIAGNOSIS — Z992 Dependence on renal dialysis: Secondary | ICD-10-CM | POA: Diagnosis not present

## 2018-11-14 DIAGNOSIS — D509 Iron deficiency anemia, unspecified: Secondary | ICD-10-CM | POA: Diagnosis not present

## 2018-11-14 DIAGNOSIS — D631 Anemia in chronic kidney disease: Secondary | ICD-10-CM | POA: Diagnosis not present

## 2018-11-14 DIAGNOSIS — N186 End stage renal disease: Secondary | ICD-10-CM | POA: Diagnosis not present

## 2018-11-14 DIAGNOSIS — Z992 Dependence on renal dialysis: Secondary | ICD-10-CM | POA: Diagnosis not present

## 2018-11-14 DIAGNOSIS — N2581 Secondary hyperparathyroidism of renal origin: Secondary | ICD-10-CM | POA: Diagnosis not present

## 2018-11-17 DIAGNOSIS — E663 Overweight: Secondary | ICD-10-CM | POA: Diagnosis not present

## 2018-11-17 DIAGNOSIS — D509 Iron deficiency anemia, unspecified: Secondary | ICD-10-CM | POA: Diagnosis not present

## 2018-11-17 DIAGNOSIS — N2581 Secondary hyperparathyroidism of renal origin: Secondary | ICD-10-CM | POA: Diagnosis not present

## 2018-11-17 DIAGNOSIS — Z992 Dependence on renal dialysis: Secondary | ICD-10-CM | POA: Diagnosis not present

## 2018-11-17 DIAGNOSIS — D631 Anemia in chronic kidney disease: Secondary | ICD-10-CM | POA: Diagnosis not present

## 2018-11-17 DIAGNOSIS — N186 End stage renal disease: Secondary | ICD-10-CM | POA: Diagnosis not present

## 2018-11-19 DIAGNOSIS — D631 Anemia in chronic kidney disease: Secondary | ICD-10-CM | POA: Diagnosis not present

## 2018-11-19 DIAGNOSIS — Z992 Dependence on renal dialysis: Secondary | ICD-10-CM | POA: Diagnosis not present

## 2018-11-19 DIAGNOSIS — Z23 Encounter for immunization: Secondary | ICD-10-CM | POA: Diagnosis not present

## 2018-11-19 DIAGNOSIS — D509 Iron deficiency anemia, unspecified: Secondary | ICD-10-CM | POA: Diagnosis not present

## 2018-11-19 DIAGNOSIS — E8779 Other fluid overload: Secondary | ICD-10-CM | POA: Diagnosis not present

## 2018-11-19 DIAGNOSIS — N186 End stage renal disease: Secondary | ICD-10-CM | POA: Diagnosis not present

## 2018-11-19 DIAGNOSIS — N2581 Secondary hyperparathyroidism of renal origin: Secondary | ICD-10-CM | POA: Diagnosis not present

## 2018-11-21 DIAGNOSIS — N2581 Secondary hyperparathyroidism of renal origin: Secondary | ICD-10-CM | POA: Diagnosis not present

## 2018-11-21 DIAGNOSIS — Z23 Encounter for immunization: Secondary | ICD-10-CM | POA: Diagnosis not present

## 2018-11-21 DIAGNOSIS — N186 End stage renal disease: Secondary | ICD-10-CM | POA: Diagnosis not present

## 2018-11-21 DIAGNOSIS — D509 Iron deficiency anemia, unspecified: Secondary | ICD-10-CM | POA: Diagnosis not present

## 2018-11-21 DIAGNOSIS — D631 Anemia in chronic kidney disease: Secondary | ICD-10-CM | POA: Diagnosis not present

## 2018-11-21 DIAGNOSIS — Z992 Dependence on renal dialysis: Secondary | ICD-10-CM | POA: Diagnosis not present

## 2018-11-24 DIAGNOSIS — D631 Anemia in chronic kidney disease: Secondary | ICD-10-CM | POA: Diagnosis not present

## 2018-11-24 DIAGNOSIS — Z992 Dependence on renal dialysis: Secondary | ICD-10-CM | POA: Diagnosis not present

## 2018-11-24 DIAGNOSIS — D509 Iron deficiency anemia, unspecified: Secondary | ICD-10-CM | POA: Diagnosis not present

## 2018-11-24 DIAGNOSIS — Z23 Encounter for immunization: Secondary | ICD-10-CM | POA: Diagnosis not present

## 2018-11-24 DIAGNOSIS — N2581 Secondary hyperparathyroidism of renal origin: Secondary | ICD-10-CM | POA: Diagnosis not present

## 2018-11-24 DIAGNOSIS — N186 End stage renal disease: Secondary | ICD-10-CM | POA: Diagnosis not present

## 2018-11-25 DIAGNOSIS — Z01818 Encounter for other preprocedural examination: Secondary | ICD-10-CM | POA: Diagnosis not present

## 2018-11-25 DIAGNOSIS — N186 End stage renal disease: Secondary | ICD-10-CM | POA: Diagnosis not present

## 2018-11-26 DIAGNOSIS — Z992 Dependence on renal dialysis: Secondary | ICD-10-CM | POA: Diagnosis not present

## 2018-11-26 DIAGNOSIS — D631 Anemia in chronic kidney disease: Secondary | ICD-10-CM | POA: Diagnosis not present

## 2018-11-26 DIAGNOSIS — N2581 Secondary hyperparathyroidism of renal origin: Secondary | ICD-10-CM | POA: Diagnosis not present

## 2018-11-26 DIAGNOSIS — Z23 Encounter for immunization: Secondary | ICD-10-CM | POA: Diagnosis not present

## 2018-11-26 DIAGNOSIS — D509 Iron deficiency anemia, unspecified: Secondary | ICD-10-CM | POA: Diagnosis not present

## 2018-11-26 DIAGNOSIS — N186 End stage renal disease: Secondary | ICD-10-CM | POA: Diagnosis not present

## 2018-11-28 DIAGNOSIS — Z992 Dependence on renal dialysis: Secondary | ICD-10-CM | POA: Diagnosis not present

## 2018-11-28 DIAGNOSIS — Z23 Encounter for immunization: Secondary | ICD-10-CM | POA: Diagnosis not present

## 2018-11-28 DIAGNOSIS — D509 Iron deficiency anemia, unspecified: Secondary | ICD-10-CM | POA: Diagnosis not present

## 2018-11-28 DIAGNOSIS — N2581 Secondary hyperparathyroidism of renal origin: Secondary | ICD-10-CM | POA: Diagnosis not present

## 2018-11-28 DIAGNOSIS — D631 Anemia in chronic kidney disease: Secondary | ICD-10-CM | POA: Diagnosis not present

## 2018-11-28 DIAGNOSIS — N186 End stage renal disease: Secondary | ICD-10-CM | POA: Diagnosis not present

## 2018-11-29 DIAGNOSIS — I2510 Atherosclerotic heart disease of native coronary artery without angina pectoris: Secondary | ICD-10-CM | POA: Diagnosis not present

## 2018-11-29 DIAGNOSIS — E119 Type 2 diabetes mellitus without complications: Secondary | ICD-10-CM | POA: Diagnosis not present

## 2018-11-29 DIAGNOSIS — R0602 Shortness of breath: Secondary | ICD-10-CM | POA: Diagnosis not present

## 2018-11-29 DIAGNOSIS — N185 Chronic kidney disease, stage 5: Secondary | ICD-10-CM | POA: Diagnosis not present

## 2018-11-30 DIAGNOSIS — R0602 Shortness of breath: Secondary | ICD-10-CM | POA: Diagnosis not present

## 2018-11-30 DIAGNOSIS — N185 Chronic kidney disease, stage 5: Secondary | ICD-10-CM | POA: Diagnosis not present

## 2018-11-30 DIAGNOSIS — I2510 Atherosclerotic heart disease of native coronary artery without angina pectoris: Secondary | ICD-10-CM | POA: Diagnosis not present

## 2018-11-30 DIAGNOSIS — E119 Type 2 diabetes mellitus without complications: Secondary | ICD-10-CM | POA: Diagnosis not present

## 2018-12-01 DIAGNOSIS — N2581 Secondary hyperparathyroidism of renal origin: Secondary | ICD-10-CM | POA: Diagnosis not present

## 2018-12-01 DIAGNOSIS — Z992 Dependence on renal dialysis: Secondary | ICD-10-CM | POA: Diagnosis not present

## 2018-12-01 DIAGNOSIS — N186 End stage renal disease: Secondary | ICD-10-CM | POA: Diagnosis not present

## 2018-12-01 DIAGNOSIS — D631 Anemia in chronic kidney disease: Secondary | ICD-10-CM | POA: Diagnosis not present

## 2018-12-01 DIAGNOSIS — D509 Iron deficiency anemia, unspecified: Secondary | ICD-10-CM | POA: Diagnosis not present

## 2018-12-01 DIAGNOSIS — Z23 Encounter for immunization: Secondary | ICD-10-CM | POA: Diagnosis not present

## 2018-12-03 DIAGNOSIS — N2581 Secondary hyperparathyroidism of renal origin: Secondary | ICD-10-CM | POA: Diagnosis not present

## 2018-12-03 DIAGNOSIS — D631 Anemia in chronic kidney disease: Secondary | ICD-10-CM | POA: Diagnosis not present

## 2018-12-03 DIAGNOSIS — D509 Iron deficiency anemia, unspecified: Secondary | ICD-10-CM | POA: Diagnosis not present

## 2018-12-03 DIAGNOSIS — N186 End stage renal disease: Secondary | ICD-10-CM | POA: Diagnosis not present

## 2018-12-03 DIAGNOSIS — Z992 Dependence on renal dialysis: Secondary | ICD-10-CM | POA: Diagnosis not present

## 2018-12-03 DIAGNOSIS — Z23 Encounter for immunization: Secondary | ICD-10-CM | POA: Diagnosis not present

## 2018-12-05 DIAGNOSIS — N186 End stage renal disease: Secondary | ICD-10-CM | POA: Diagnosis not present

## 2018-12-05 DIAGNOSIS — D509 Iron deficiency anemia, unspecified: Secondary | ICD-10-CM | POA: Diagnosis not present

## 2018-12-05 DIAGNOSIS — N2581 Secondary hyperparathyroidism of renal origin: Secondary | ICD-10-CM | POA: Diagnosis not present

## 2018-12-05 DIAGNOSIS — Z23 Encounter for immunization: Secondary | ICD-10-CM | POA: Diagnosis not present

## 2018-12-05 DIAGNOSIS — Z992 Dependence on renal dialysis: Secondary | ICD-10-CM | POA: Diagnosis not present

## 2018-12-05 DIAGNOSIS — D631 Anemia in chronic kidney disease: Secondary | ICD-10-CM | POA: Diagnosis not present

## 2018-12-08 DIAGNOSIS — N2581 Secondary hyperparathyroidism of renal origin: Secondary | ICD-10-CM | POA: Diagnosis not present

## 2018-12-08 DIAGNOSIS — N186 End stage renal disease: Secondary | ICD-10-CM | POA: Diagnosis not present

## 2018-12-08 DIAGNOSIS — D509 Iron deficiency anemia, unspecified: Secondary | ICD-10-CM | POA: Diagnosis not present

## 2018-12-08 DIAGNOSIS — Z23 Encounter for immunization: Secondary | ICD-10-CM | POA: Diagnosis not present

## 2018-12-08 DIAGNOSIS — Z992 Dependence on renal dialysis: Secondary | ICD-10-CM | POA: Diagnosis not present

## 2018-12-08 DIAGNOSIS — D631 Anemia in chronic kidney disease: Secondary | ICD-10-CM | POA: Diagnosis not present

## 2018-12-10 DIAGNOSIS — Z992 Dependence on renal dialysis: Secondary | ICD-10-CM | POA: Diagnosis not present

## 2018-12-10 DIAGNOSIS — D509 Iron deficiency anemia, unspecified: Secondary | ICD-10-CM | POA: Diagnosis not present

## 2018-12-10 DIAGNOSIS — Z23 Encounter for immunization: Secondary | ICD-10-CM | POA: Diagnosis not present

## 2018-12-10 DIAGNOSIS — D631 Anemia in chronic kidney disease: Secondary | ICD-10-CM | POA: Diagnosis not present

## 2018-12-10 DIAGNOSIS — N186 End stage renal disease: Secondary | ICD-10-CM | POA: Diagnosis not present

## 2018-12-10 DIAGNOSIS — N2581 Secondary hyperparathyroidism of renal origin: Secondary | ICD-10-CM | POA: Diagnosis not present

## 2018-12-12 DIAGNOSIS — N186 End stage renal disease: Secondary | ICD-10-CM | POA: Diagnosis not present

## 2018-12-12 DIAGNOSIS — D509 Iron deficiency anemia, unspecified: Secondary | ICD-10-CM | POA: Diagnosis not present

## 2018-12-12 DIAGNOSIS — D631 Anemia in chronic kidney disease: Secondary | ICD-10-CM | POA: Diagnosis not present

## 2018-12-12 DIAGNOSIS — Z992 Dependence on renal dialysis: Secondary | ICD-10-CM | POA: Diagnosis not present

## 2018-12-12 DIAGNOSIS — N2581 Secondary hyperparathyroidism of renal origin: Secondary | ICD-10-CM | POA: Diagnosis not present

## 2018-12-12 DIAGNOSIS — Z23 Encounter for immunization: Secondary | ICD-10-CM | POA: Diagnosis not present

## 2018-12-13 DIAGNOSIS — Z992 Dependence on renal dialysis: Secondary | ICD-10-CM | POA: Diagnosis not present

## 2018-12-13 DIAGNOSIS — D631 Anemia in chronic kidney disease: Secondary | ICD-10-CM | POA: Diagnosis not present

## 2018-12-13 DIAGNOSIS — Z23 Encounter for immunization: Secondary | ICD-10-CM | POA: Diagnosis not present

## 2018-12-13 DIAGNOSIS — N186 End stage renal disease: Secondary | ICD-10-CM | POA: Diagnosis not present

## 2018-12-13 DIAGNOSIS — N2581 Secondary hyperparathyroidism of renal origin: Secondary | ICD-10-CM | POA: Diagnosis not present

## 2018-12-13 DIAGNOSIS — D509 Iron deficiency anemia, unspecified: Secondary | ICD-10-CM | POA: Diagnosis not present

## 2018-12-15 DIAGNOSIS — Z23 Encounter for immunization: Secondary | ICD-10-CM | POA: Diagnosis not present

## 2018-12-15 DIAGNOSIS — N186 End stage renal disease: Secondary | ICD-10-CM | POA: Diagnosis not present

## 2018-12-15 DIAGNOSIS — D509 Iron deficiency anemia, unspecified: Secondary | ICD-10-CM | POA: Diagnosis not present

## 2018-12-15 DIAGNOSIS — N2581 Secondary hyperparathyroidism of renal origin: Secondary | ICD-10-CM | POA: Diagnosis not present

## 2018-12-15 DIAGNOSIS — D631 Anemia in chronic kidney disease: Secondary | ICD-10-CM | POA: Diagnosis not present

## 2018-12-15 DIAGNOSIS — Z992 Dependence on renal dialysis: Secondary | ICD-10-CM | POA: Diagnosis not present

## 2018-12-17 DIAGNOSIS — N2581 Secondary hyperparathyroidism of renal origin: Secondary | ICD-10-CM | POA: Diagnosis not present

## 2018-12-17 DIAGNOSIS — Z23 Encounter for immunization: Secondary | ICD-10-CM | POA: Diagnosis not present

## 2018-12-17 DIAGNOSIS — Z992 Dependence on renal dialysis: Secondary | ICD-10-CM | POA: Diagnosis not present

## 2018-12-17 DIAGNOSIS — N186 End stage renal disease: Secondary | ICD-10-CM | POA: Diagnosis not present

## 2018-12-17 DIAGNOSIS — D509 Iron deficiency anemia, unspecified: Secondary | ICD-10-CM | POA: Diagnosis not present

## 2018-12-17 DIAGNOSIS — D631 Anemia in chronic kidney disease: Secondary | ICD-10-CM | POA: Diagnosis not present

## 2018-12-19 DIAGNOSIS — D509 Iron deficiency anemia, unspecified: Secondary | ICD-10-CM | POA: Diagnosis not present

## 2018-12-19 DIAGNOSIS — Z23 Encounter for immunization: Secondary | ICD-10-CM | POA: Diagnosis not present

## 2018-12-19 DIAGNOSIS — N186 End stage renal disease: Secondary | ICD-10-CM | POA: Diagnosis not present

## 2018-12-19 DIAGNOSIS — Z992 Dependence on renal dialysis: Secondary | ICD-10-CM | POA: Diagnosis not present

## 2018-12-19 DIAGNOSIS — N2581 Secondary hyperparathyroidism of renal origin: Secondary | ICD-10-CM | POA: Diagnosis not present

## 2018-12-19 DIAGNOSIS — D631 Anemia in chronic kidney disease: Secondary | ICD-10-CM | POA: Diagnosis not present

## 2018-12-22 DIAGNOSIS — Z992 Dependence on renal dialysis: Secondary | ICD-10-CM | POA: Diagnosis not present

## 2018-12-22 DIAGNOSIS — D509 Iron deficiency anemia, unspecified: Secondary | ICD-10-CM | POA: Diagnosis not present

## 2018-12-22 DIAGNOSIS — N186 End stage renal disease: Secondary | ICD-10-CM | POA: Diagnosis not present

## 2018-12-22 DIAGNOSIS — D631 Anemia in chronic kidney disease: Secondary | ICD-10-CM | POA: Diagnosis not present

## 2018-12-22 DIAGNOSIS — Z23 Encounter for immunization: Secondary | ICD-10-CM | POA: Diagnosis not present

## 2018-12-22 DIAGNOSIS — N2581 Secondary hyperparathyroidism of renal origin: Secondary | ICD-10-CM | POA: Diagnosis not present

## 2018-12-24 DIAGNOSIS — N2581 Secondary hyperparathyroidism of renal origin: Secondary | ICD-10-CM | POA: Diagnosis not present

## 2018-12-24 DIAGNOSIS — Z992 Dependence on renal dialysis: Secondary | ICD-10-CM | POA: Diagnosis not present

## 2018-12-24 DIAGNOSIS — D509 Iron deficiency anemia, unspecified: Secondary | ICD-10-CM | POA: Diagnosis not present

## 2018-12-24 DIAGNOSIS — D631 Anemia in chronic kidney disease: Secondary | ICD-10-CM | POA: Diagnosis not present

## 2018-12-24 DIAGNOSIS — Z23 Encounter for immunization: Secondary | ICD-10-CM | POA: Diagnosis not present

## 2018-12-24 DIAGNOSIS — N186 End stage renal disease: Secondary | ICD-10-CM | POA: Diagnosis not present

## 2018-12-26 DIAGNOSIS — N186 End stage renal disease: Secondary | ICD-10-CM | POA: Diagnosis not present

## 2018-12-26 DIAGNOSIS — Z23 Encounter for immunization: Secondary | ICD-10-CM | POA: Diagnosis not present

## 2018-12-26 DIAGNOSIS — D509 Iron deficiency anemia, unspecified: Secondary | ICD-10-CM | POA: Diagnosis not present

## 2018-12-26 DIAGNOSIS — D631 Anemia in chronic kidney disease: Secondary | ICD-10-CM | POA: Diagnosis not present

## 2018-12-26 DIAGNOSIS — Z992 Dependence on renal dialysis: Secondary | ICD-10-CM | POA: Diagnosis not present

## 2018-12-26 DIAGNOSIS — N2581 Secondary hyperparathyroidism of renal origin: Secondary | ICD-10-CM | POA: Diagnosis not present

## 2018-12-29 DIAGNOSIS — D509 Iron deficiency anemia, unspecified: Secondary | ICD-10-CM | POA: Diagnosis not present

## 2018-12-29 DIAGNOSIS — D631 Anemia in chronic kidney disease: Secondary | ICD-10-CM | POA: Diagnosis not present

## 2018-12-29 DIAGNOSIS — N186 End stage renal disease: Secondary | ICD-10-CM | POA: Diagnosis not present

## 2018-12-29 DIAGNOSIS — Z23 Encounter for immunization: Secondary | ICD-10-CM | POA: Diagnosis not present

## 2018-12-29 DIAGNOSIS — Z992 Dependence on renal dialysis: Secondary | ICD-10-CM | POA: Diagnosis not present

## 2018-12-29 DIAGNOSIS — N2581 Secondary hyperparathyroidism of renal origin: Secondary | ICD-10-CM | POA: Diagnosis not present

## 2018-12-31 DIAGNOSIS — D631 Anemia in chronic kidney disease: Secondary | ICD-10-CM | POA: Diagnosis not present

## 2018-12-31 DIAGNOSIS — N186 End stage renal disease: Secondary | ICD-10-CM | POA: Diagnosis not present

## 2018-12-31 DIAGNOSIS — Z992 Dependence on renal dialysis: Secondary | ICD-10-CM | POA: Diagnosis not present

## 2018-12-31 DIAGNOSIS — D509 Iron deficiency anemia, unspecified: Secondary | ICD-10-CM | POA: Diagnosis not present

## 2018-12-31 DIAGNOSIS — N2581 Secondary hyperparathyroidism of renal origin: Secondary | ICD-10-CM | POA: Diagnosis not present

## 2018-12-31 DIAGNOSIS — Z23 Encounter for immunization: Secondary | ICD-10-CM | POA: Diagnosis not present

## 2019-01-02 DIAGNOSIS — D631 Anemia in chronic kidney disease: Secondary | ICD-10-CM | POA: Diagnosis not present

## 2019-01-02 DIAGNOSIS — Z23 Encounter for immunization: Secondary | ICD-10-CM | POA: Diagnosis not present

## 2019-01-02 DIAGNOSIS — Z992 Dependence on renal dialysis: Secondary | ICD-10-CM | POA: Diagnosis not present

## 2019-01-02 DIAGNOSIS — N2581 Secondary hyperparathyroidism of renal origin: Secondary | ICD-10-CM | POA: Diagnosis not present

## 2019-01-02 DIAGNOSIS — D509 Iron deficiency anemia, unspecified: Secondary | ICD-10-CM | POA: Diagnosis not present

## 2019-01-02 DIAGNOSIS — N186 End stage renal disease: Secondary | ICD-10-CM | POA: Diagnosis not present

## 2019-01-06 DIAGNOSIS — N2581 Secondary hyperparathyroidism of renal origin: Secondary | ICD-10-CM | POA: Diagnosis not present

## 2019-01-06 DIAGNOSIS — N186 End stage renal disease: Secondary | ICD-10-CM | POA: Diagnosis not present

## 2019-01-06 DIAGNOSIS — Z23 Encounter for immunization: Secondary | ICD-10-CM | POA: Diagnosis not present

## 2019-01-06 DIAGNOSIS — Z992 Dependence on renal dialysis: Secondary | ICD-10-CM | POA: Diagnosis not present

## 2019-01-06 DIAGNOSIS — D509 Iron deficiency anemia, unspecified: Secondary | ICD-10-CM | POA: Diagnosis not present

## 2019-01-06 DIAGNOSIS — D631 Anemia in chronic kidney disease: Secondary | ICD-10-CM | POA: Diagnosis not present

## 2019-01-07 DIAGNOSIS — D631 Anemia in chronic kidney disease: Secondary | ICD-10-CM | POA: Diagnosis not present

## 2019-01-07 DIAGNOSIS — Z992 Dependence on renal dialysis: Secondary | ICD-10-CM | POA: Diagnosis not present

## 2019-01-07 DIAGNOSIS — N2581 Secondary hyperparathyroidism of renal origin: Secondary | ICD-10-CM | POA: Diagnosis not present

## 2019-01-07 DIAGNOSIS — Z23 Encounter for immunization: Secondary | ICD-10-CM | POA: Diagnosis not present

## 2019-01-07 DIAGNOSIS — N186 End stage renal disease: Secondary | ICD-10-CM | POA: Diagnosis not present

## 2019-01-07 DIAGNOSIS — D509 Iron deficiency anemia, unspecified: Secondary | ICD-10-CM | POA: Diagnosis not present

## 2019-01-09 DIAGNOSIS — N2581 Secondary hyperparathyroidism of renal origin: Secondary | ICD-10-CM | POA: Diagnosis not present

## 2019-01-09 DIAGNOSIS — N186 End stage renal disease: Secondary | ICD-10-CM | POA: Diagnosis not present

## 2019-01-09 DIAGNOSIS — D509 Iron deficiency anemia, unspecified: Secondary | ICD-10-CM | POA: Diagnosis not present

## 2019-01-09 DIAGNOSIS — Z992 Dependence on renal dialysis: Secondary | ICD-10-CM | POA: Diagnosis not present

## 2019-01-09 DIAGNOSIS — Z23 Encounter for immunization: Secondary | ICD-10-CM | POA: Diagnosis not present

## 2019-01-09 DIAGNOSIS — D631 Anemia in chronic kidney disease: Secondary | ICD-10-CM | POA: Diagnosis not present

## 2019-01-13 DIAGNOSIS — N186 End stage renal disease: Secondary | ICD-10-CM | POA: Diagnosis not present

## 2019-01-13 DIAGNOSIS — N2581 Secondary hyperparathyroidism of renal origin: Secondary | ICD-10-CM | POA: Diagnosis not present

## 2019-01-13 DIAGNOSIS — Z992 Dependence on renal dialysis: Secondary | ICD-10-CM | POA: Diagnosis not present

## 2019-01-13 DIAGNOSIS — D631 Anemia in chronic kidney disease: Secondary | ICD-10-CM | POA: Diagnosis not present

## 2019-01-13 DIAGNOSIS — D509 Iron deficiency anemia, unspecified: Secondary | ICD-10-CM | POA: Diagnosis not present

## 2019-01-13 DIAGNOSIS — Z23 Encounter for immunization: Secondary | ICD-10-CM | POA: Diagnosis not present

## 2019-01-14 DIAGNOSIS — D631 Anemia in chronic kidney disease: Secondary | ICD-10-CM | POA: Diagnosis not present

## 2019-01-14 DIAGNOSIS — Z992 Dependence on renal dialysis: Secondary | ICD-10-CM | POA: Diagnosis not present

## 2019-01-14 DIAGNOSIS — D509 Iron deficiency anemia, unspecified: Secondary | ICD-10-CM | POA: Diagnosis not present

## 2019-01-14 DIAGNOSIS — Z23 Encounter for immunization: Secondary | ICD-10-CM | POA: Diagnosis not present

## 2019-01-14 DIAGNOSIS — N186 End stage renal disease: Secondary | ICD-10-CM | POA: Diagnosis not present

## 2019-01-14 DIAGNOSIS — N2581 Secondary hyperparathyroidism of renal origin: Secondary | ICD-10-CM | POA: Diagnosis not present

## 2019-01-15 DIAGNOSIS — E1165 Type 2 diabetes mellitus with hyperglycemia: Secondary | ICD-10-CM | POA: Diagnosis not present

## 2019-01-15 DIAGNOSIS — R609 Edema, unspecified: Secondary | ICD-10-CM | POA: Diagnosis not present

## 2019-01-15 DIAGNOSIS — E663 Overweight: Secondary | ICD-10-CM | POA: Diagnosis not present

## 2019-01-16 DIAGNOSIS — Z23 Encounter for immunization: Secondary | ICD-10-CM | POA: Diagnosis not present

## 2019-01-16 DIAGNOSIS — N2581 Secondary hyperparathyroidism of renal origin: Secondary | ICD-10-CM | POA: Diagnosis not present

## 2019-01-16 DIAGNOSIS — D631 Anemia in chronic kidney disease: Secondary | ICD-10-CM | POA: Diagnosis not present

## 2019-01-16 DIAGNOSIS — N186 End stage renal disease: Secondary | ICD-10-CM | POA: Diagnosis not present

## 2019-01-16 DIAGNOSIS — D509 Iron deficiency anemia, unspecified: Secondary | ICD-10-CM | POA: Diagnosis not present

## 2019-01-16 DIAGNOSIS — Z992 Dependence on renal dialysis: Secondary | ICD-10-CM | POA: Diagnosis not present

## 2019-01-19 DIAGNOSIS — D509 Iron deficiency anemia, unspecified: Secondary | ICD-10-CM | POA: Diagnosis not present

## 2019-01-19 DIAGNOSIS — N2581 Secondary hyperparathyroidism of renal origin: Secondary | ICD-10-CM | POA: Diagnosis not present

## 2019-01-19 DIAGNOSIS — Z992 Dependence on renal dialysis: Secondary | ICD-10-CM | POA: Diagnosis not present

## 2019-01-19 DIAGNOSIS — N186 End stage renal disease: Secondary | ICD-10-CM | POA: Diagnosis not present

## 2019-01-19 DIAGNOSIS — D631 Anemia in chronic kidney disease: Secondary | ICD-10-CM | POA: Diagnosis not present

## 2019-01-21 DIAGNOSIS — Z992 Dependence on renal dialysis: Secondary | ICD-10-CM | POA: Diagnosis not present

## 2019-01-21 DIAGNOSIS — D631 Anemia in chronic kidney disease: Secondary | ICD-10-CM | POA: Diagnosis not present

## 2019-01-21 DIAGNOSIS — D509 Iron deficiency anemia, unspecified: Secondary | ICD-10-CM | POA: Diagnosis not present

## 2019-01-21 DIAGNOSIS — N2581 Secondary hyperparathyroidism of renal origin: Secondary | ICD-10-CM | POA: Diagnosis not present

## 2019-01-21 DIAGNOSIS — N186 End stage renal disease: Secondary | ICD-10-CM | POA: Diagnosis not present

## 2019-01-23 DIAGNOSIS — N2581 Secondary hyperparathyroidism of renal origin: Secondary | ICD-10-CM | POA: Diagnosis not present

## 2019-01-23 DIAGNOSIS — D631 Anemia in chronic kidney disease: Secondary | ICD-10-CM | POA: Diagnosis not present

## 2019-01-23 DIAGNOSIS — D509 Iron deficiency anemia, unspecified: Secondary | ICD-10-CM | POA: Diagnosis not present

## 2019-01-23 DIAGNOSIS — N186 End stage renal disease: Secondary | ICD-10-CM | POA: Diagnosis not present

## 2019-01-23 DIAGNOSIS — Z992 Dependence on renal dialysis: Secondary | ICD-10-CM | POA: Diagnosis not present

## 2019-01-26 DIAGNOSIS — N2581 Secondary hyperparathyroidism of renal origin: Secondary | ICD-10-CM | POA: Diagnosis not present

## 2019-01-26 DIAGNOSIS — N186 End stage renal disease: Secondary | ICD-10-CM | POA: Diagnosis not present

## 2019-01-26 DIAGNOSIS — D509 Iron deficiency anemia, unspecified: Secondary | ICD-10-CM | POA: Diagnosis not present

## 2019-01-26 DIAGNOSIS — Z992 Dependence on renal dialysis: Secondary | ICD-10-CM | POA: Diagnosis not present

## 2019-01-26 DIAGNOSIS — D631 Anemia in chronic kidney disease: Secondary | ICD-10-CM | POA: Diagnosis not present

## 2019-01-27 DIAGNOSIS — Z01812 Encounter for preprocedural laboratory examination: Secondary | ICD-10-CM | POA: Diagnosis not present

## 2019-01-27 DIAGNOSIS — Z20828 Contact with and (suspected) exposure to other viral communicable diseases: Secondary | ICD-10-CM | POA: Diagnosis not present

## 2019-01-28 DIAGNOSIS — D631 Anemia in chronic kidney disease: Secondary | ICD-10-CM | POA: Diagnosis not present

## 2019-01-28 DIAGNOSIS — N186 End stage renal disease: Secondary | ICD-10-CM | POA: Diagnosis not present

## 2019-01-28 DIAGNOSIS — Z992 Dependence on renal dialysis: Secondary | ICD-10-CM | POA: Diagnosis not present

## 2019-01-28 DIAGNOSIS — N2581 Secondary hyperparathyroidism of renal origin: Secondary | ICD-10-CM | POA: Diagnosis not present

## 2019-01-28 DIAGNOSIS — D509 Iron deficiency anemia, unspecified: Secondary | ICD-10-CM | POA: Diagnosis not present

## 2019-01-29 DIAGNOSIS — E1122 Type 2 diabetes mellitus with diabetic chronic kidney disease: Secondary | ICD-10-CM | POA: Diagnosis not present

## 2019-01-29 DIAGNOSIS — N186 End stage renal disease: Secondary | ICD-10-CM | POA: Diagnosis not present

## 2019-01-29 DIAGNOSIS — Z95810 Presence of automatic (implantable) cardiac defibrillator: Secondary | ICD-10-CM | POA: Diagnosis not present

## 2019-01-29 DIAGNOSIS — E1165 Type 2 diabetes mellitus with hyperglycemia: Secondary | ICD-10-CM | POA: Diagnosis not present

## 2019-01-29 DIAGNOSIS — E785 Hyperlipidemia, unspecified: Secondary | ICD-10-CM | POA: Diagnosis not present

## 2019-01-29 DIAGNOSIS — I2510 Atherosclerotic heart disease of native coronary artery without angina pectoris: Secondary | ICD-10-CM | POA: Diagnosis not present

## 2019-01-29 DIAGNOSIS — I120 Hypertensive chronic kidney disease with stage 5 chronic kidney disease or end stage renal disease: Secondary | ICD-10-CM | POA: Diagnosis not present

## 2019-01-29 DIAGNOSIS — I69354 Hemiplegia and hemiparesis following cerebral infarction affecting left non-dominant side: Secondary | ICD-10-CM | POA: Diagnosis not present

## 2019-01-29 DIAGNOSIS — Z955 Presence of coronary angioplasty implant and graft: Secondary | ICD-10-CM | POA: Diagnosis not present

## 2019-01-30 DIAGNOSIS — N186 End stage renal disease: Secondary | ICD-10-CM | POA: Diagnosis not present

## 2019-01-30 DIAGNOSIS — D631 Anemia in chronic kidney disease: Secondary | ICD-10-CM | POA: Diagnosis not present

## 2019-01-30 DIAGNOSIS — D509 Iron deficiency anemia, unspecified: Secondary | ICD-10-CM | POA: Diagnosis not present

## 2019-01-30 DIAGNOSIS — Z992 Dependence on renal dialysis: Secondary | ICD-10-CM | POA: Diagnosis not present

## 2019-01-30 DIAGNOSIS — N2581 Secondary hyperparathyroidism of renal origin: Secondary | ICD-10-CM | POA: Diagnosis not present

## 2019-02-02 DIAGNOSIS — D509 Iron deficiency anemia, unspecified: Secondary | ICD-10-CM | POA: Diagnosis not present

## 2019-02-02 DIAGNOSIS — N186 End stage renal disease: Secondary | ICD-10-CM | POA: Diagnosis not present

## 2019-02-02 DIAGNOSIS — Z992 Dependence on renal dialysis: Secondary | ICD-10-CM | POA: Diagnosis not present

## 2019-02-02 DIAGNOSIS — N2581 Secondary hyperparathyroidism of renal origin: Secondary | ICD-10-CM | POA: Diagnosis not present

## 2019-02-02 DIAGNOSIS — D631 Anemia in chronic kidney disease: Secondary | ICD-10-CM | POA: Diagnosis not present

## 2019-02-04 DIAGNOSIS — D631 Anemia in chronic kidney disease: Secondary | ICD-10-CM | POA: Diagnosis not present

## 2019-02-04 DIAGNOSIS — Z992 Dependence on renal dialysis: Secondary | ICD-10-CM | POA: Diagnosis not present

## 2019-02-04 DIAGNOSIS — N2581 Secondary hyperparathyroidism of renal origin: Secondary | ICD-10-CM | POA: Diagnosis not present

## 2019-02-04 DIAGNOSIS — N186 End stage renal disease: Secondary | ICD-10-CM | POA: Diagnosis not present

## 2019-02-04 DIAGNOSIS — D509 Iron deficiency anemia, unspecified: Secondary | ICD-10-CM | POA: Diagnosis not present

## 2019-02-06 DIAGNOSIS — N186 End stage renal disease: Secondary | ICD-10-CM | POA: Diagnosis not present

## 2019-02-06 DIAGNOSIS — Z992 Dependence on renal dialysis: Secondary | ICD-10-CM | POA: Diagnosis not present

## 2019-02-06 DIAGNOSIS — D631 Anemia in chronic kidney disease: Secondary | ICD-10-CM | POA: Diagnosis not present

## 2019-02-06 DIAGNOSIS — D509 Iron deficiency anemia, unspecified: Secondary | ICD-10-CM | POA: Diagnosis not present

## 2019-02-06 DIAGNOSIS — N2581 Secondary hyperparathyroidism of renal origin: Secondary | ICD-10-CM | POA: Diagnosis not present

## 2019-02-09 DIAGNOSIS — D509 Iron deficiency anemia, unspecified: Secondary | ICD-10-CM | POA: Diagnosis not present

## 2019-02-09 DIAGNOSIS — D631 Anemia in chronic kidney disease: Secondary | ICD-10-CM | POA: Diagnosis not present

## 2019-02-09 DIAGNOSIS — N2581 Secondary hyperparathyroidism of renal origin: Secondary | ICD-10-CM | POA: Diagnosis not present

## 2019-02-09 DIAGNOSIS — Z992 Dependence on renal dialysis: Secondary | ICD-10-CM | POA: Diagnosis not present

## 2019-02-09 DIAGNOSIS — N186 End stage renal disease: Secondary | ICD-10-CM | POA: Diagnosis not present

## 2019-02-11 DIAGNOSIS — D631 Anemia in chronic kidney disease: Secondary | ICD-10-CM | POA: Diagnosis not present

## 2019-02-11 DIAGNOSIS — Z992 Dependence on renal dialysis: Secondary | ICD-10-CM | POA: Diagnosis not present

## 2019-02-11 DIAGNOSIS — N2581 Secondary hyperparathyroidism of renal origin: Secondary | ICD-10-CM | POA: Diagnosis not present

## 2019-02-11 DIAGNOSIS — N186 End stage renal disease: Secondary | ICD-10-CM | POA: Diagnosis not present

## 2019-02-11 DIAGNOSIS — D509 Iron deficiency anemia, unspecified: Secondary | ICD-10-CM | POA: Diagnosis not present

## 2019-02-13 DIAGNOSIS — D509 Iron deficiency anemia, unspecified: Secondary | ICD-10-CM | POA: Diagnosis not present

## 2019-02-13 DIAGNOSIS — N186 End stage renal disease: Secondary | ICD-10-CM | POA: Diagnosis not present

## 2019-02-13 DIAGNOSIS — D631 Anemia in chronic kidney disease: Secondary | ICD-10-CM | POA: Diagnosis not present

## 2019-02-13 DIAGNOSIS — N2581 Secondary hyperparathyroidism of renal origin: Secondary | ICD-10-CM | POA: Diagnosis not present

## 2019-02-13 DIAGNOSIS — Z992 Dependence on renal dialysis: Secondary | ICD-10-CM | POA: Diagnosis not present

## 2019-02-16 DIAGNOSIS — Z992 Dependence on renal dialysis: Secondary | ICD-10-CM | POA: Diagnosis not present

## 2019-02-16 DIAGNOSIS — D631 Anemia in chronic kidney disease: Secondary | ICD-10-CM | POA: Diagnosis not present

## 2019-02-16 DIAGNOSIS — N2581 Secondary hyperparathyroidism of renal origin: Secondary | ICD-10-CM | POA: Diagnosis not present

## 2019-02-16 DIAGNOSIS — D509 Iron deficiency anemia, unspecified: Secondary | ICD-10-CM | POA: Diagnosis not present

## 2019-02-16 DIAGNOSIS — N186 End stage renal disease: Secondary | ICD-10-CM | POA: Diagnosis not present

## 2019-02-17 IMAGING — US US RENAL
1 series · 14 of 25 positions shown · non-contrast
Comparison: None.

CLINICAL DATA: 51-year-old male with acute kidney injury.

EXAM:
RENAL / URINARY TRACT ULTRASOUND COMPLETE

[Series 1: us renal · 0.23mm/px · 14 of 28 slices shown]
[im 1/28]
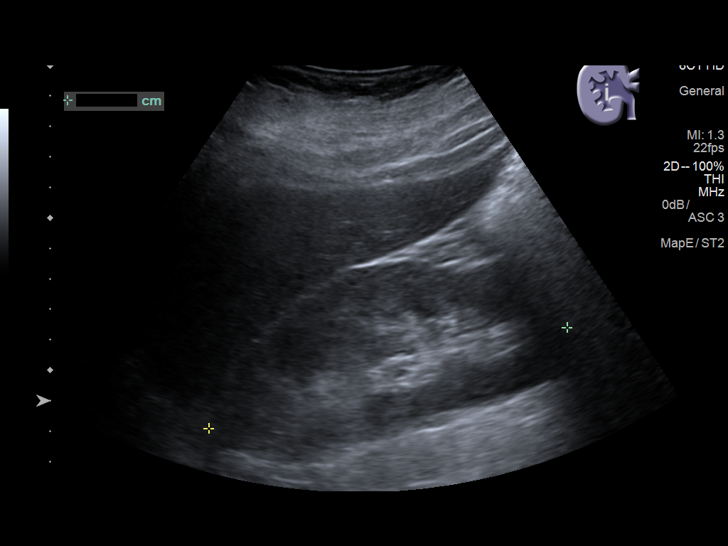
[im 3/28]
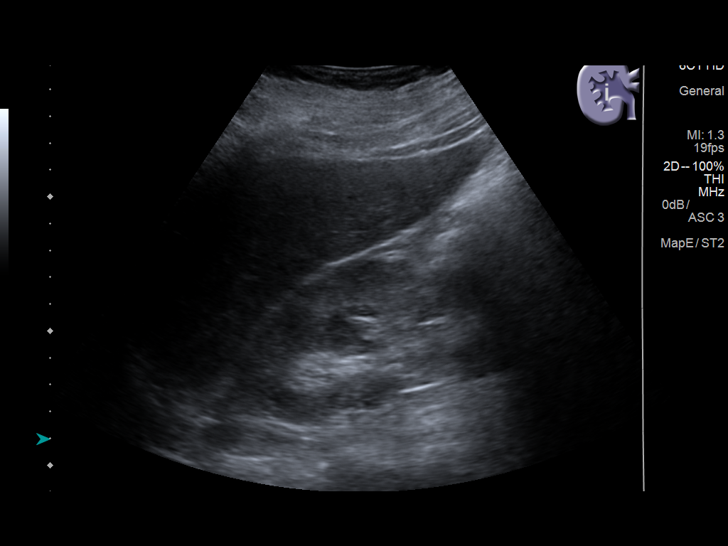
[im 5/28]
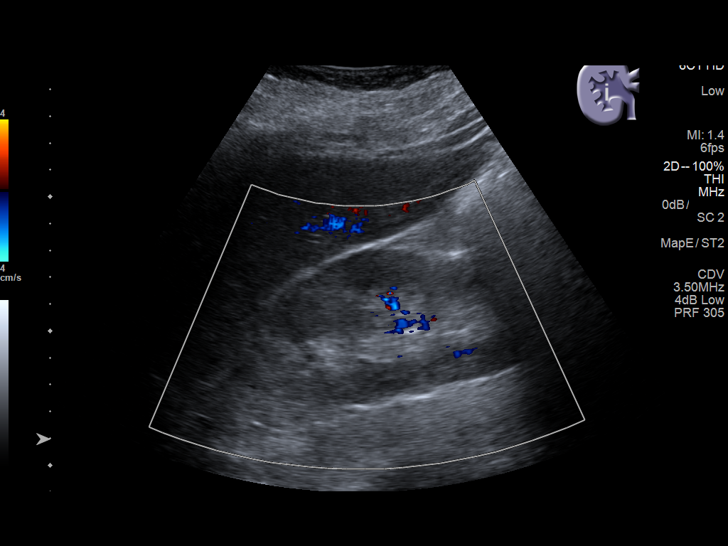
[im 7/28]
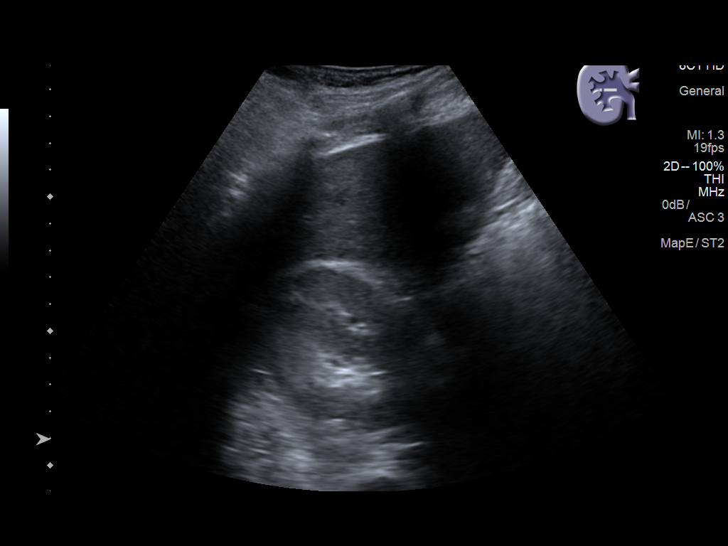
[im 10/28]
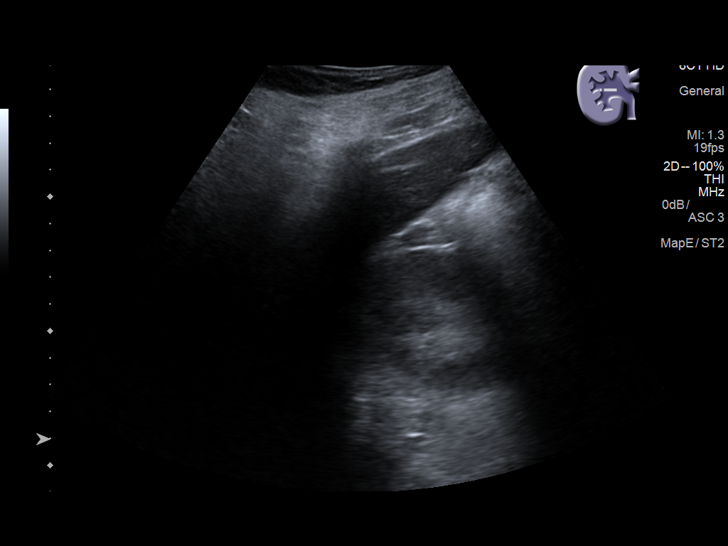
[im 11/28]
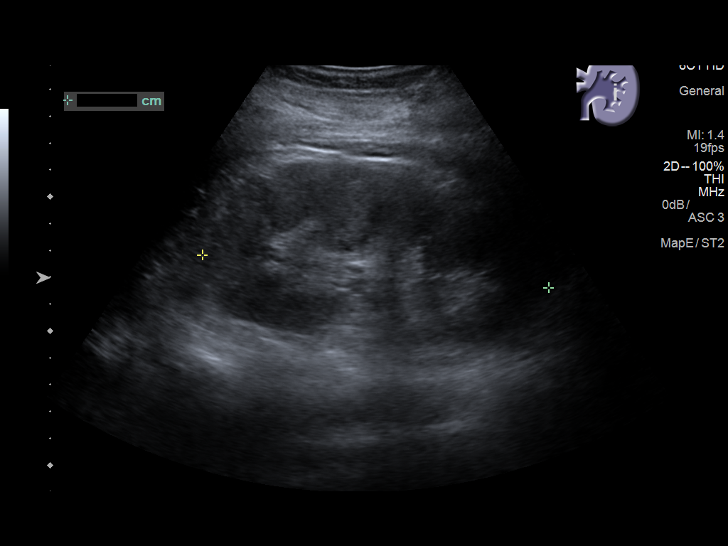
[im 13/28]
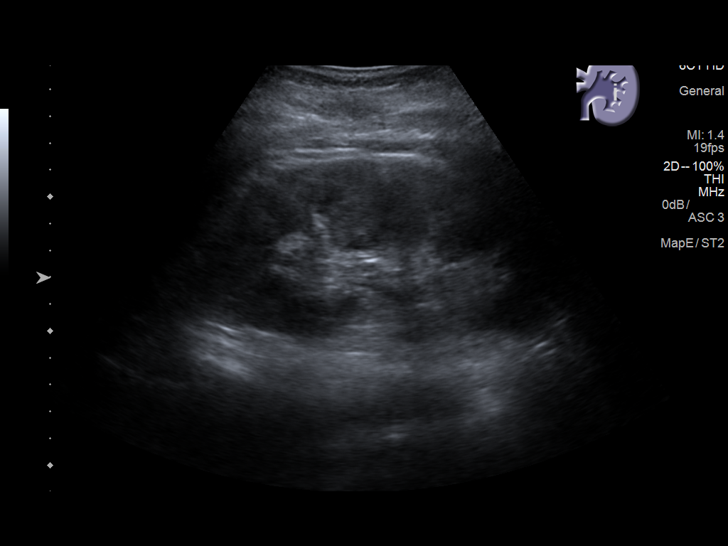
[im 15/28]
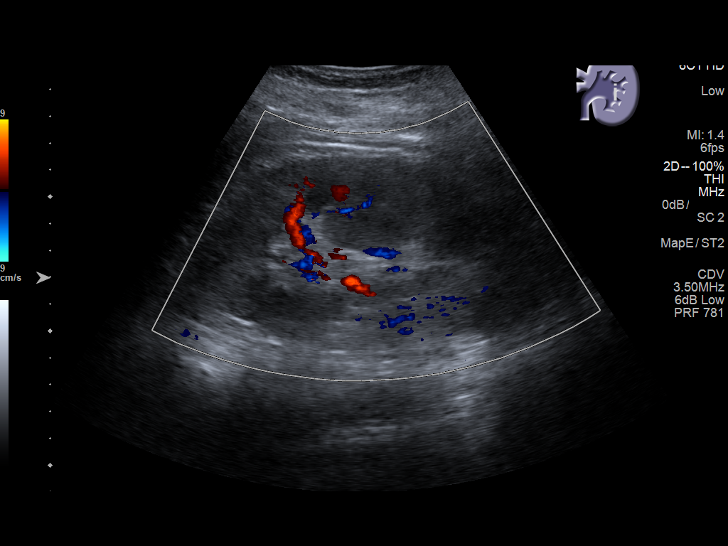
[im 17/28]
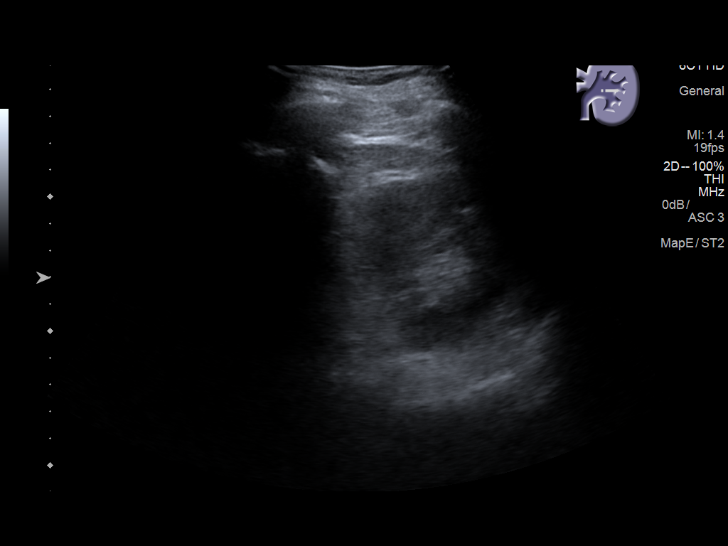
[im 19/28]
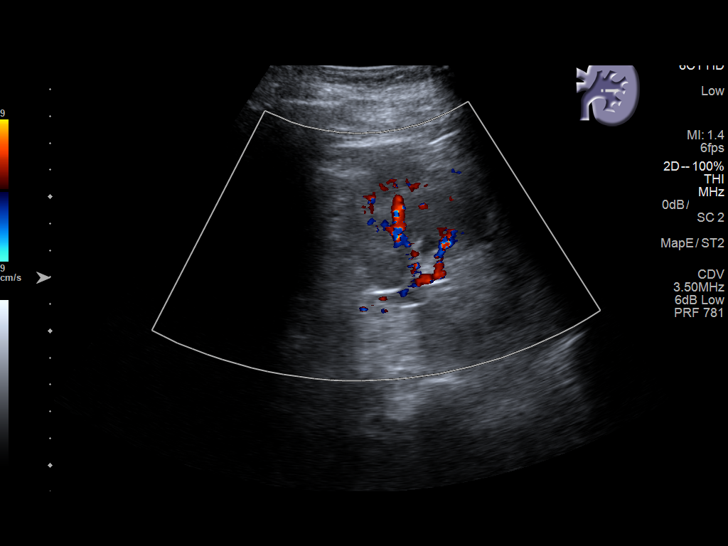
[im 21/28]
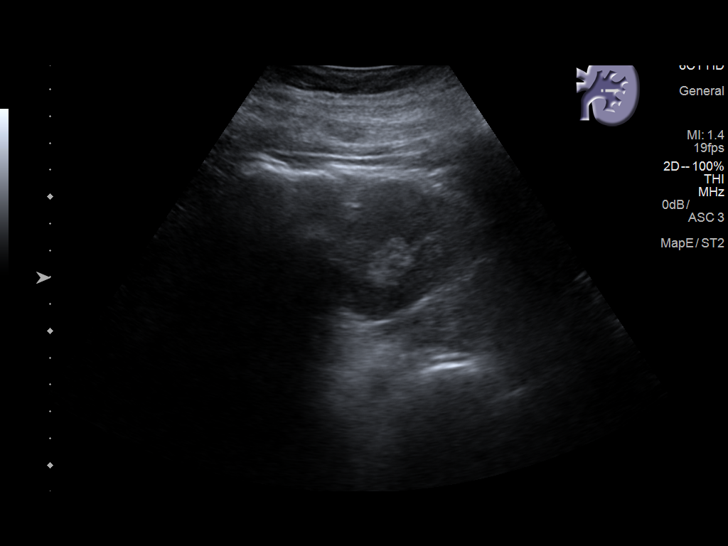
[im 23/28]
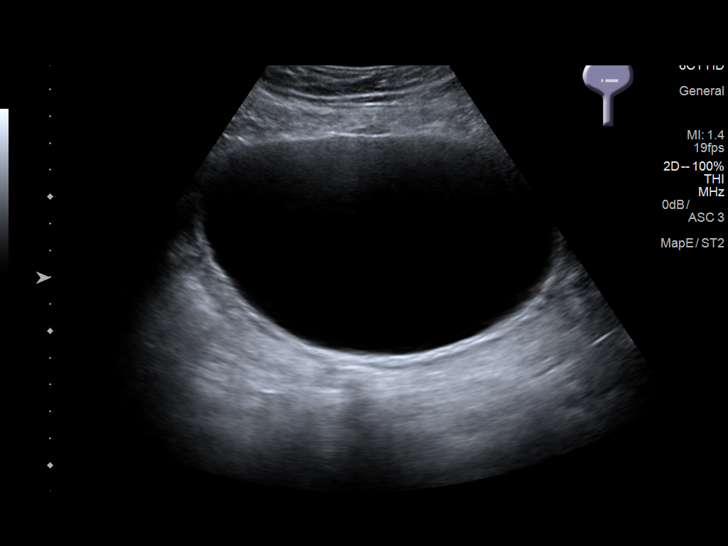
[im 25/28]
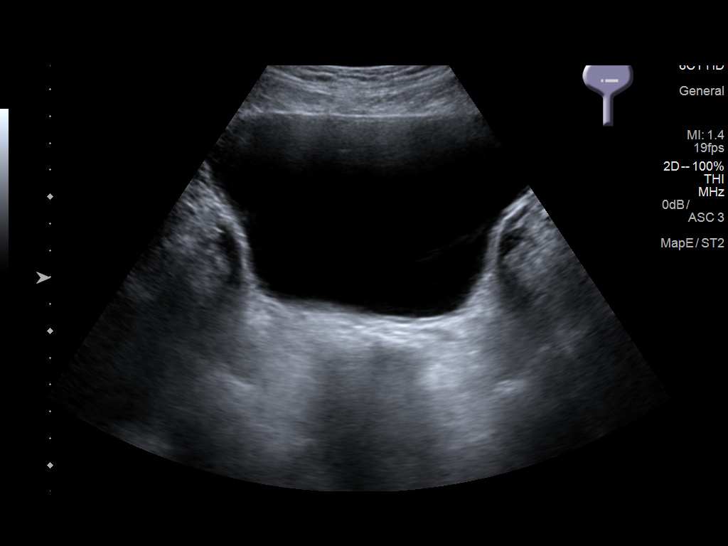
[im 28/28]
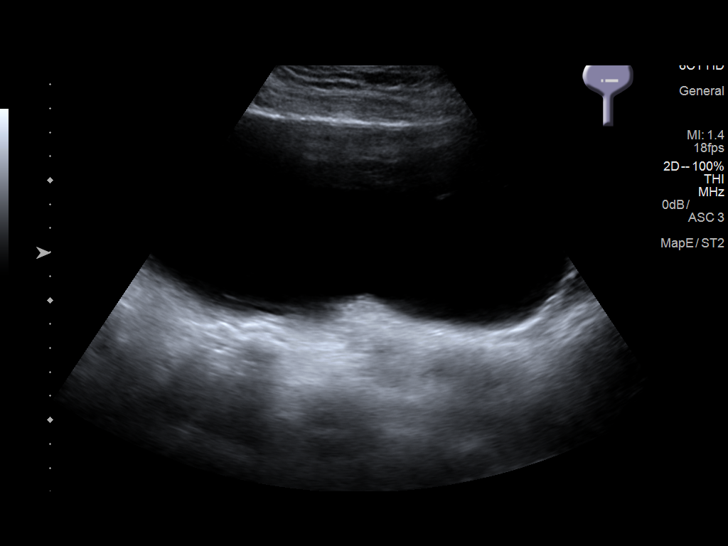

[14 of 25 positions shown; findings below may reference images not displayed]

FINDINGS: Right Kidney:

Length: 12.2 cm. Echogenicity within normal limits. No mass or
hydronephrosis visualized.

Left Kidney:

Length: 12.9 cm. Echogenicity within normal limits. No mass or
hydronephrosis visualized.

Bladder:

Appears normal for degree of bladder distention.

Incidental note is made a small LEFT pleural effusion.
IMPRESSION: 1. Unremarkable kidneys.  No evidence of hydronephrosis.
2. Small LEFT pleural effusion.

## 2019-04-10 IMAGING — US US RENAL
1 series · 14 of 20 positions shown · non-contrast
Comparison: None.

CLINICAL DATA: Acute renal injury

EXAM:
RENAL / URINARY TRACT ULTRASOUND COMPLETE

[Series 1: us renal · 0.28mm/px · 14 of 20 slices shown]
[im 1/20]
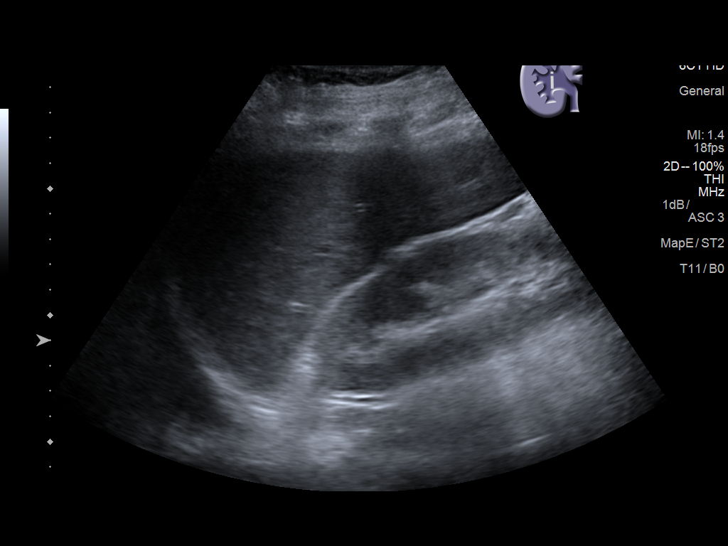
[im 3/20]
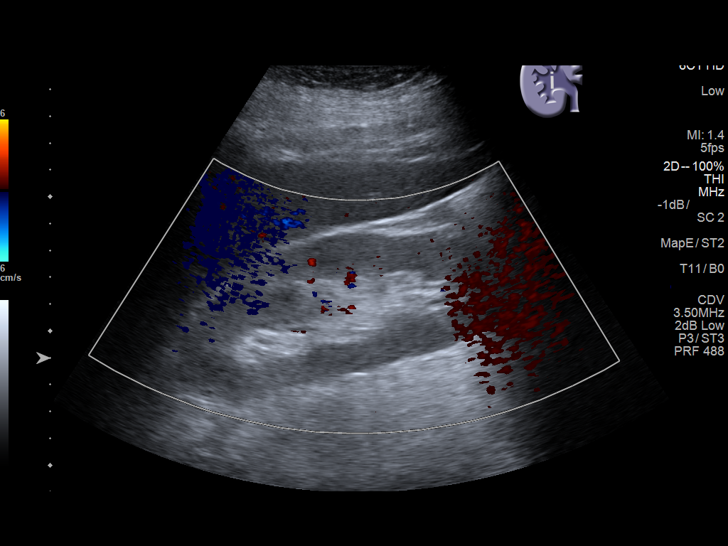
[im 4/20]
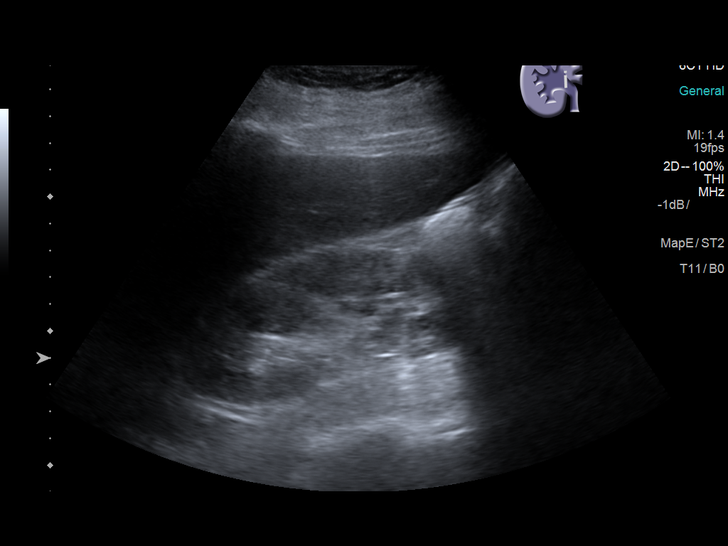
[im 6/20]
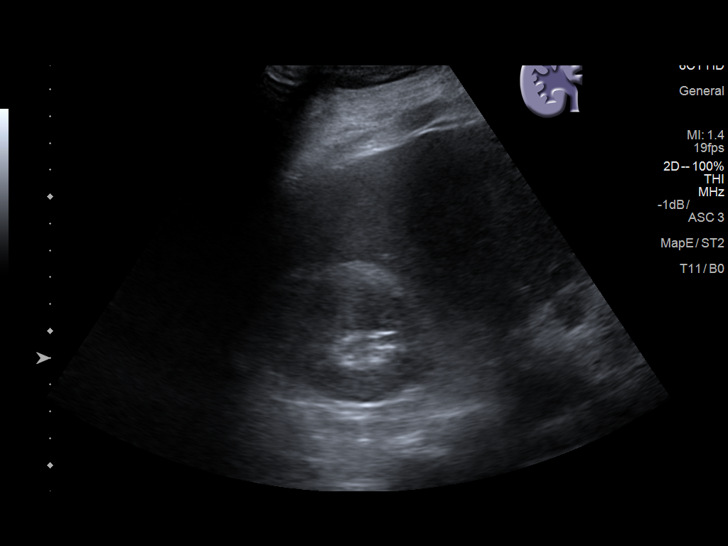
[im 7/20]
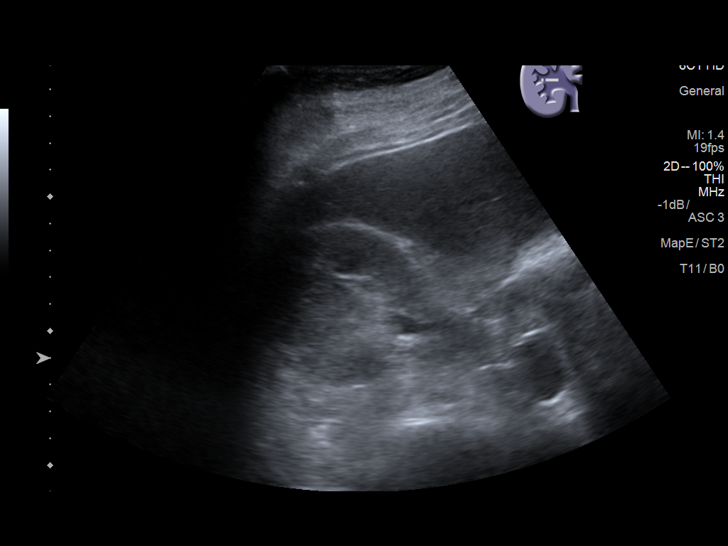
[im 8/20]
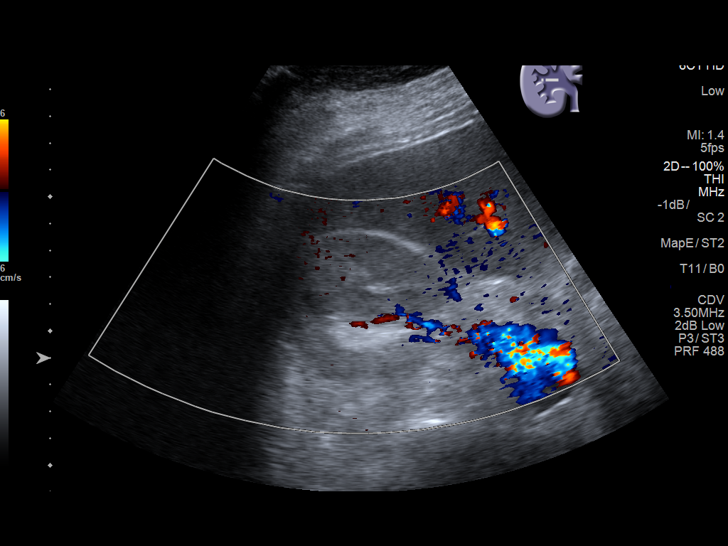
[im 10/20]
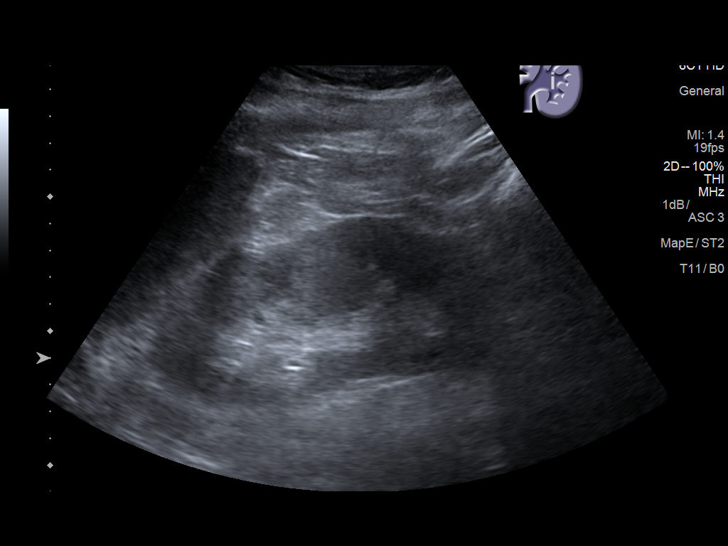
[im 11/20]
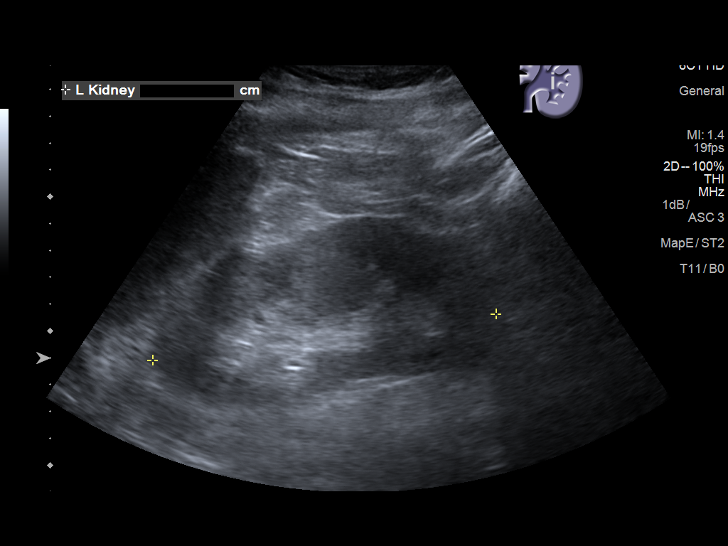
[im 13/20]
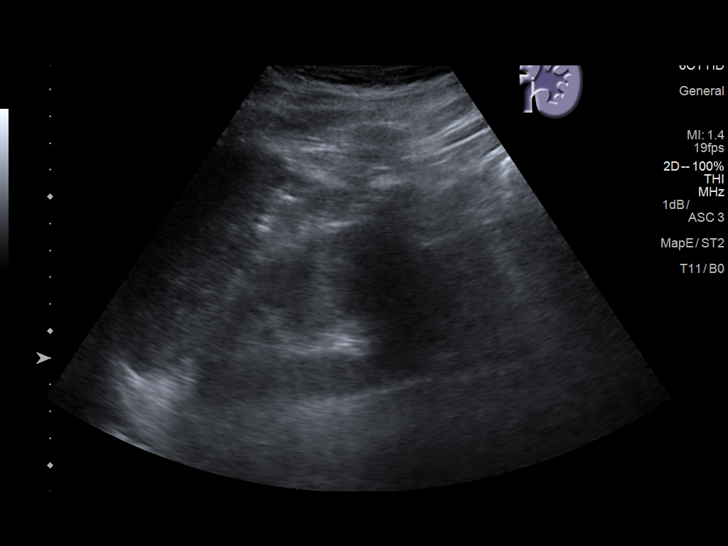
[im 14/20]
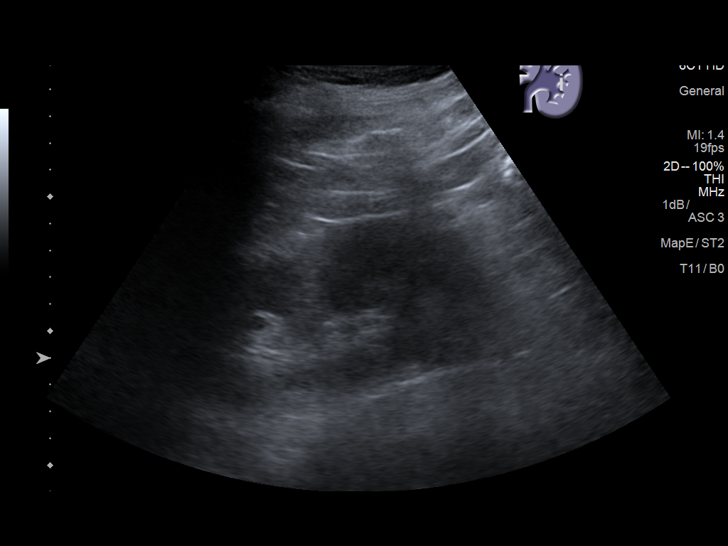
[im 16/20]
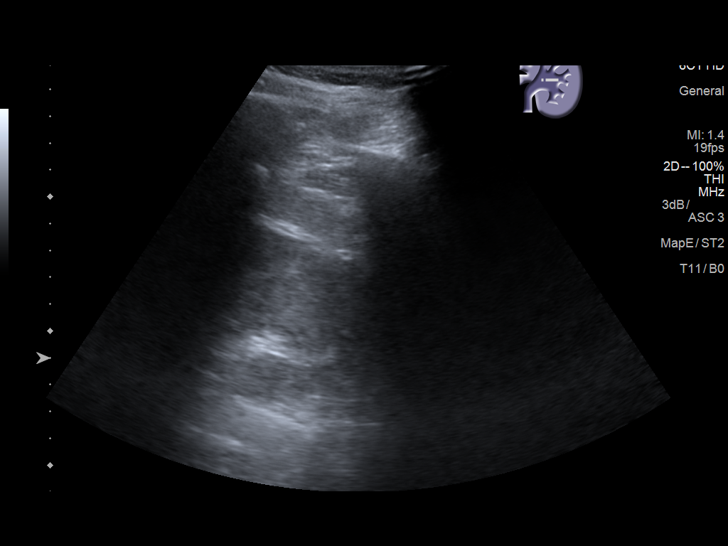
[im 17/20]
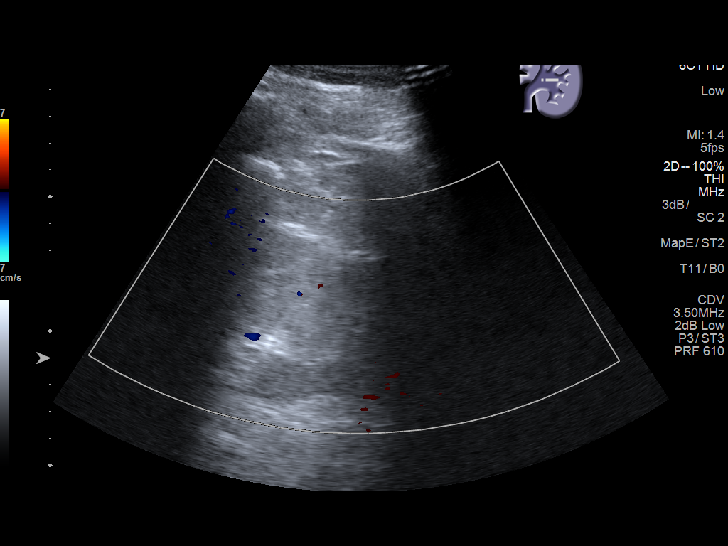
[im 18/20]
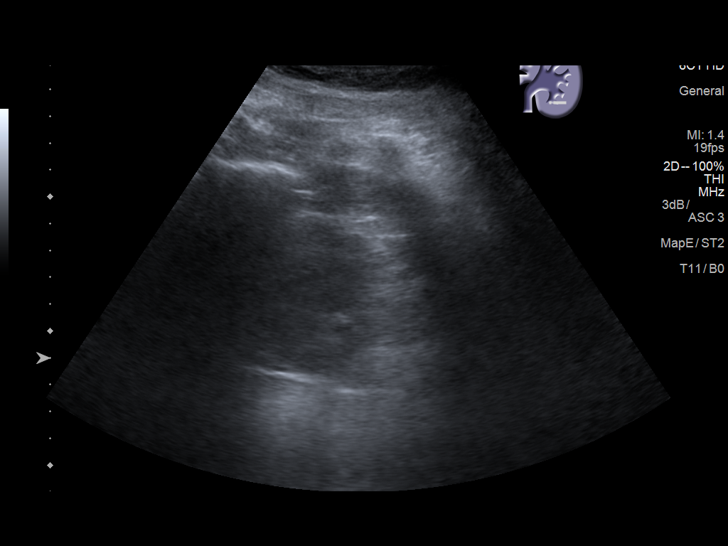
[im 20/20]
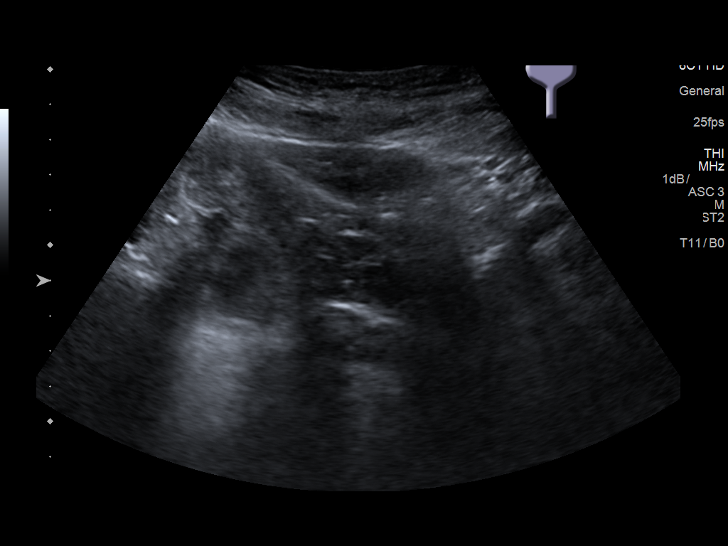

[14 of 20 positions shown; findings below may reference images not displayed]

FINDINGS: Right Kidney:

Length: 12.6 cm. Echogenicity within normal limits. No mass or
hydronephrosis visualized.

Left Kidney:

Length: 12.9 cm. Echogenicity within normal limits. No mass or
hydronephrosis visualized.

Bladder:

Suboptimally assessed due to empty bladder.
IMPRESSION: Maintained cortical-medullary distinction about both kidneys. No
obstructive uropathy, mass or nephrolithiasis.

## 2019-08-03 IMAGING — DX DG CHEST 1V PORT
1 series · 1 of 1 positions shown · non-contrast
Comparison: Portable exam 6966 hours without priors for comparison

CLINICAL DATA: Shortness of breath, on BiPAP

EXAM:
PORTABLE CHEST 1 VIEW

[chest ap]
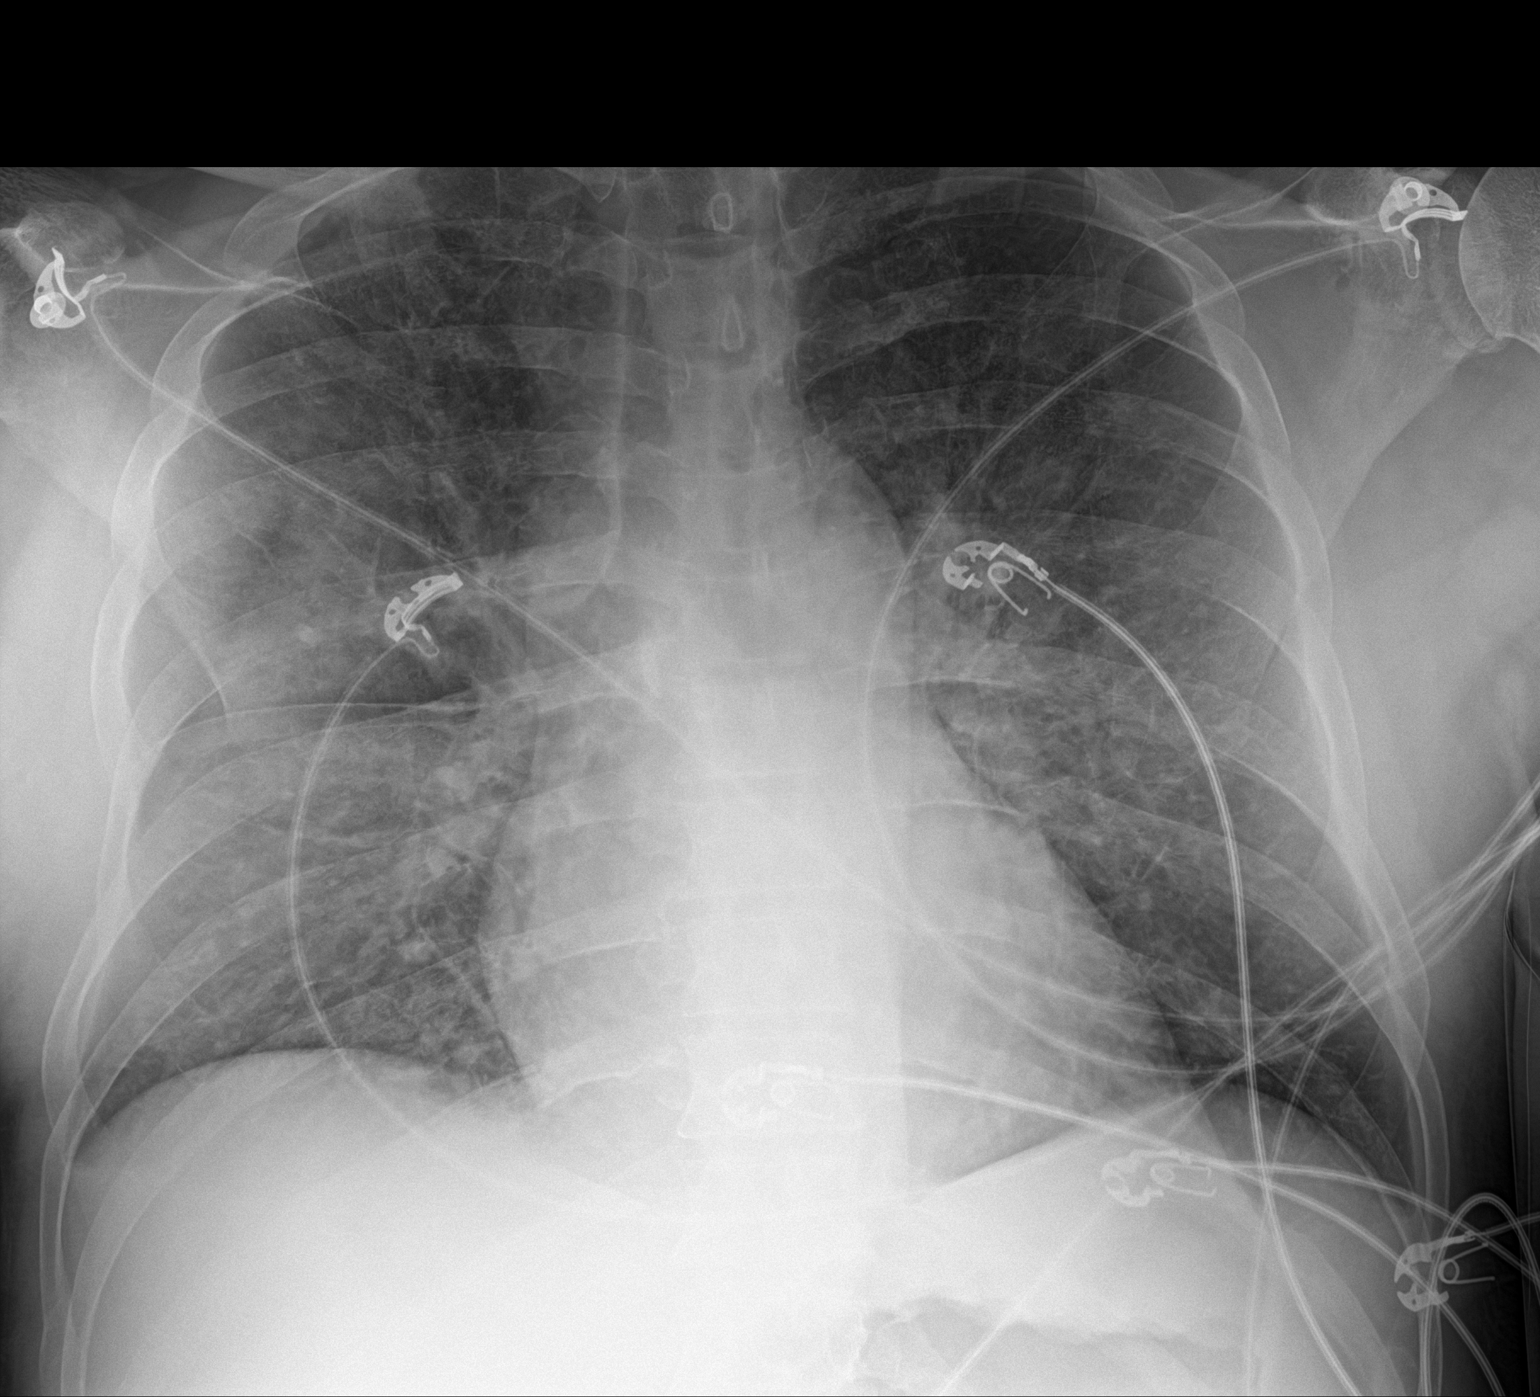

[1 of 1 positions shown; findings below may reference images not displayed]

FINDINGS: Normal heart size, mediastinal contours and pulmonary vascularity.

Perihilar infiltrates bilaterally which could represent pulmonary
edema, developing ARDS or infection.

No pleural effusion or pneumothorax.

Bones unremarkable.
IMPRESSION: BILATERAL perihilar infiltrates which could represent pulmonary
edema, developing ARDS or infection.

## 2019-09-23 IMAGING — DX DG CHEST 1V PORT
1 series · 1 of 1 positions shown · non-contrast
Comparison: 07/22/2017 chest radiograph.

CLINICAL DATA: Dyspnea

EXAM:
PORTABLE CHEST 1 VIEW

[chest]
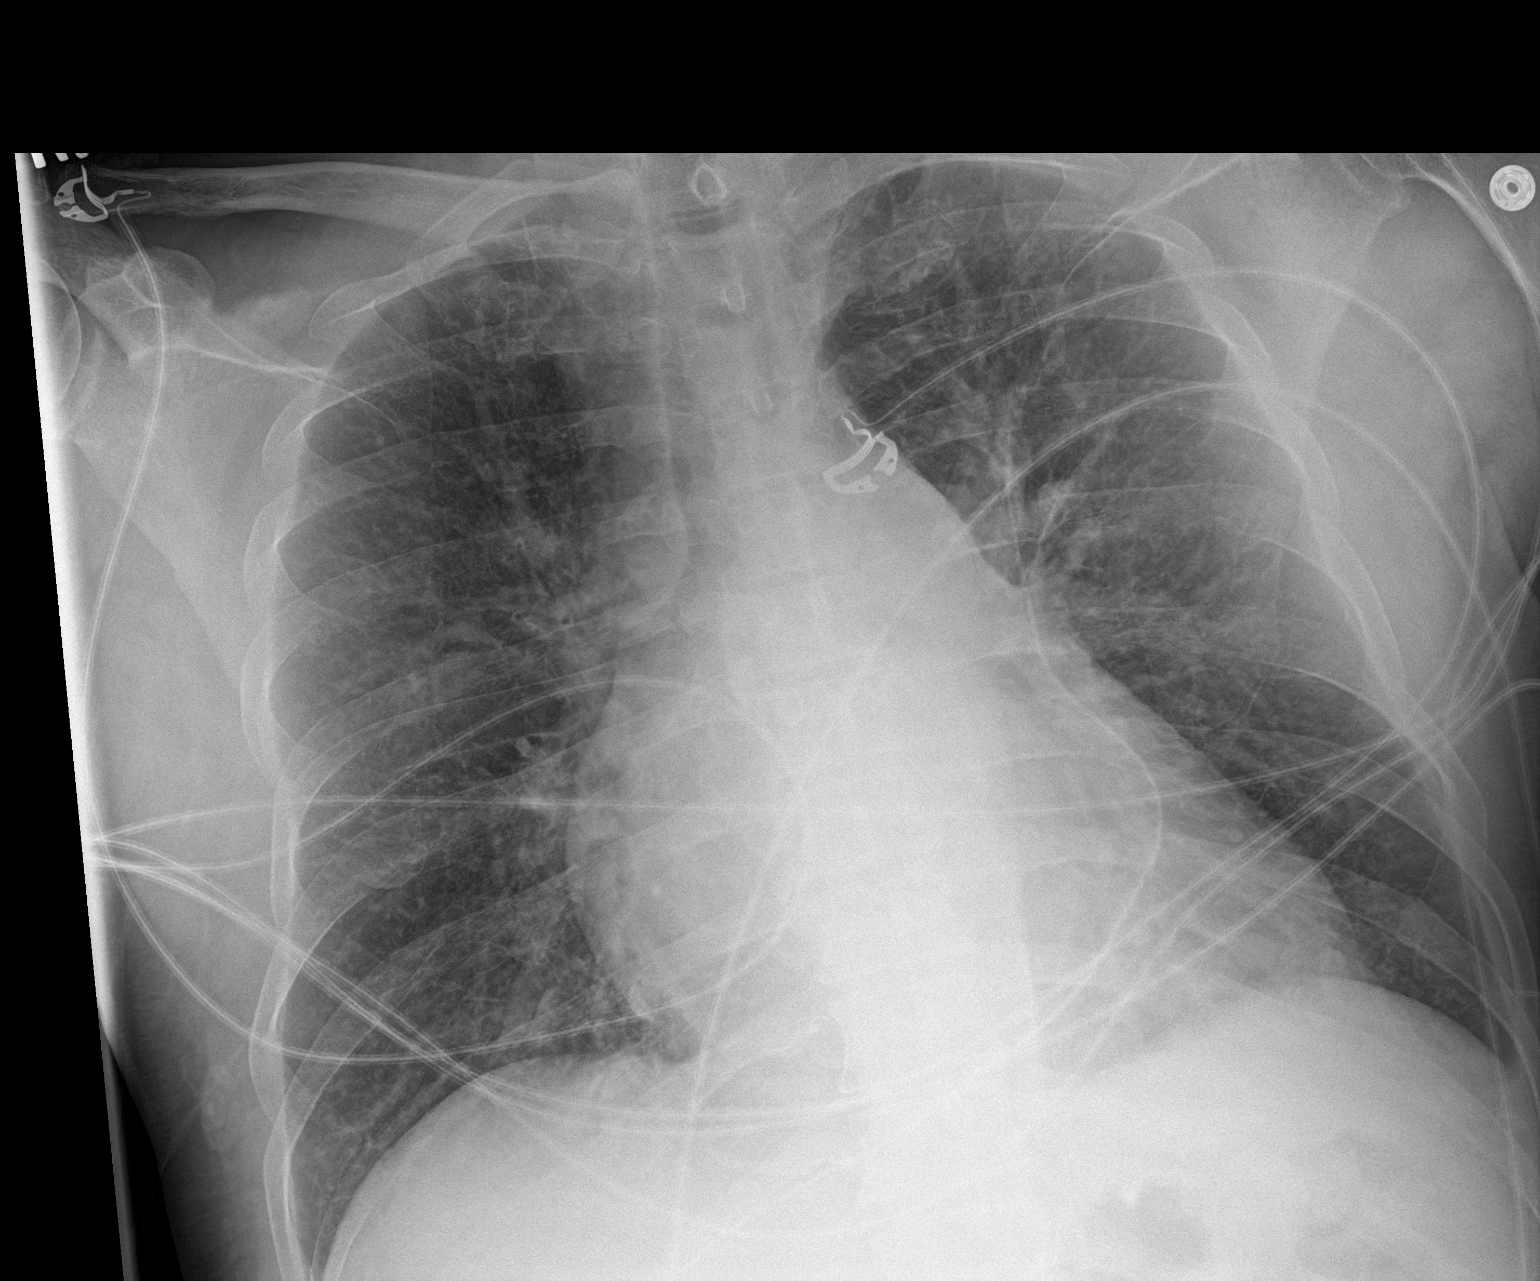

[1 of 1 positions shown; findings below may reference images not displayed]

FINDINGS: Stable cardiomediastinal silhouette with mild cardiomegaly. No
pneumothorax. No pleural effusion. Mild pulmonary edema.
IMPRESSION: Mild pulmonary edema with mild cardiomegaly, suggesting mild
congestive heart failure.

## 2019-10-09 IMAGING — CR DG CHEST 2V
2 series · 2 of 2 positions shown · non-contrast
Comparison: September 17, 2017

CLINICAL DATA: Left-sided chest pain with onset yesterday.

EXAM:
CHEST - 2 VIEW

[chest pa]
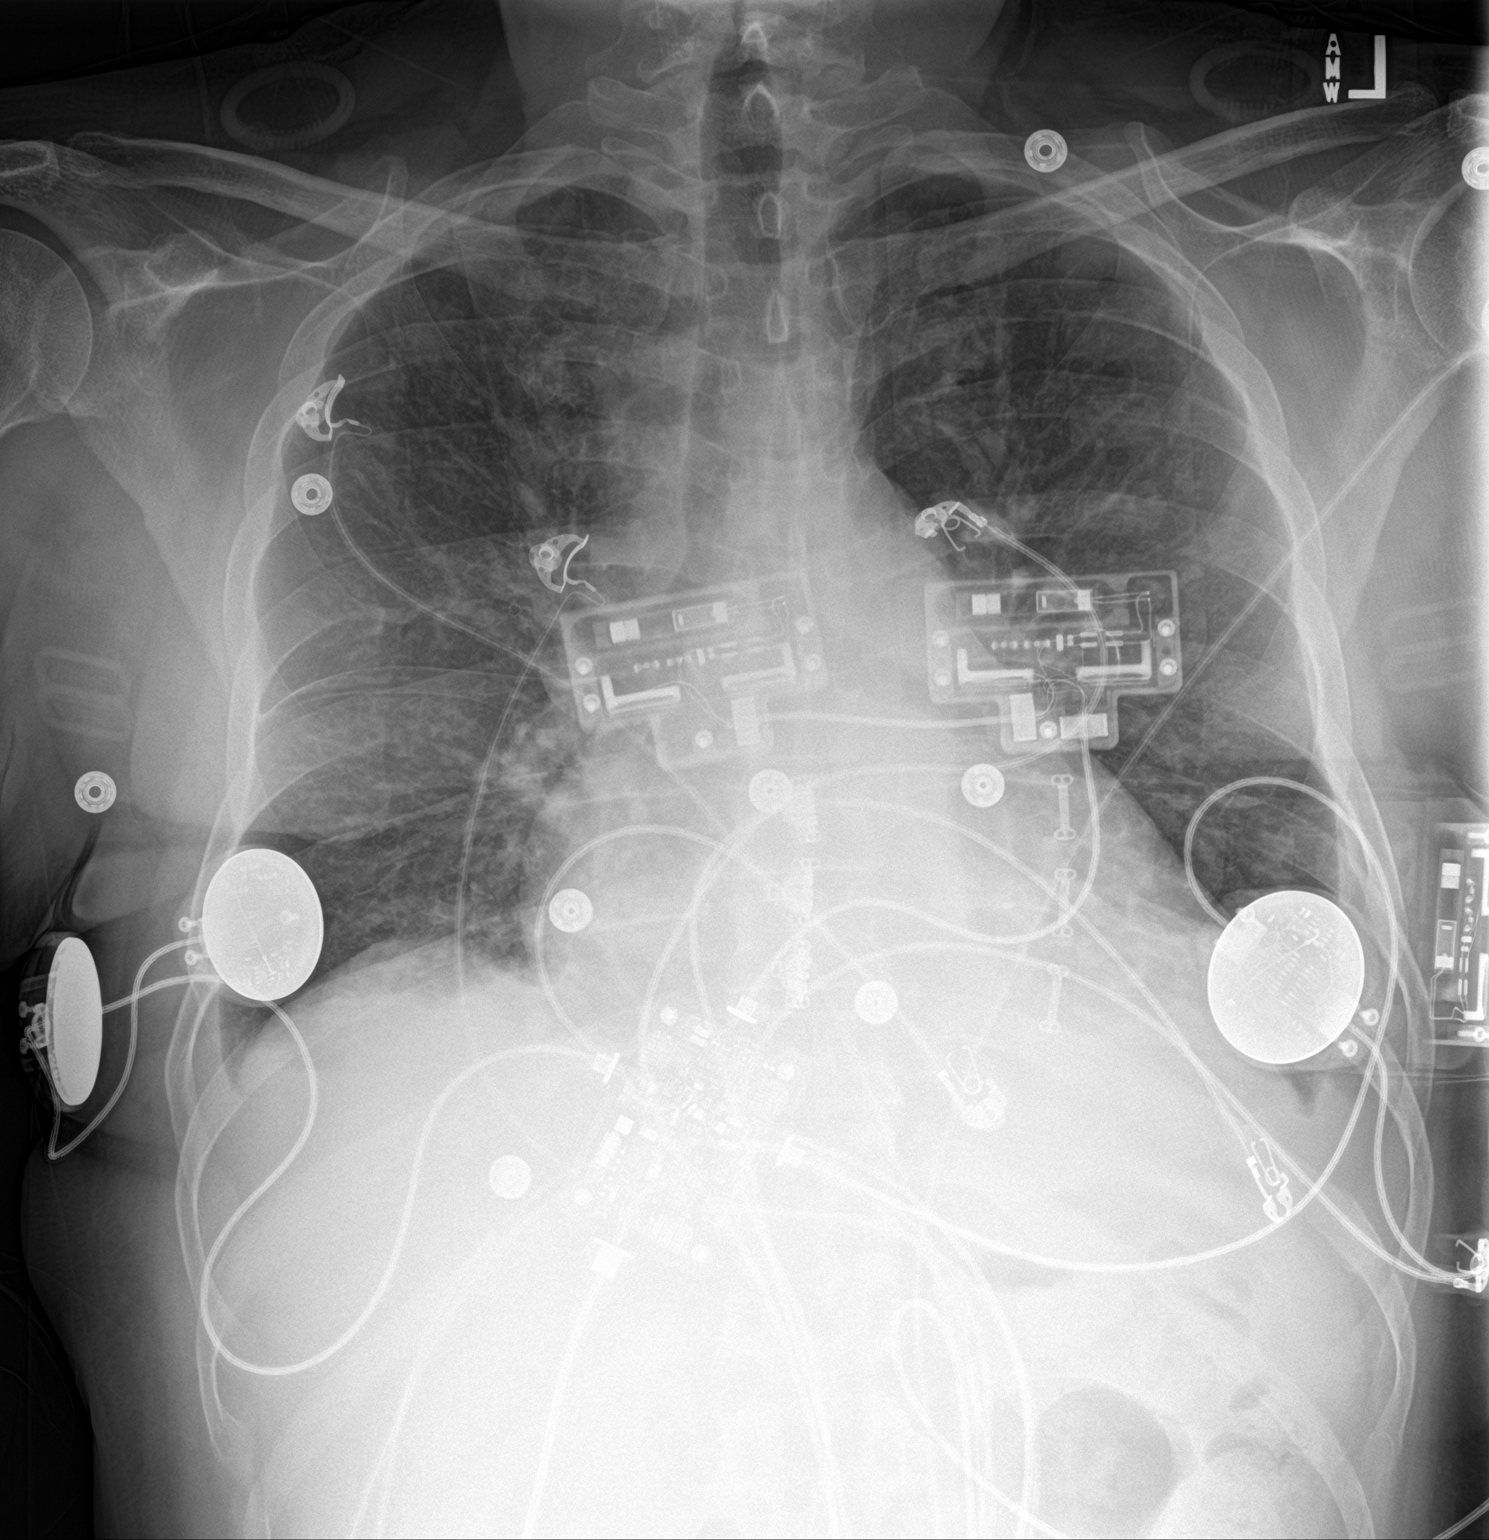

[chest lat]
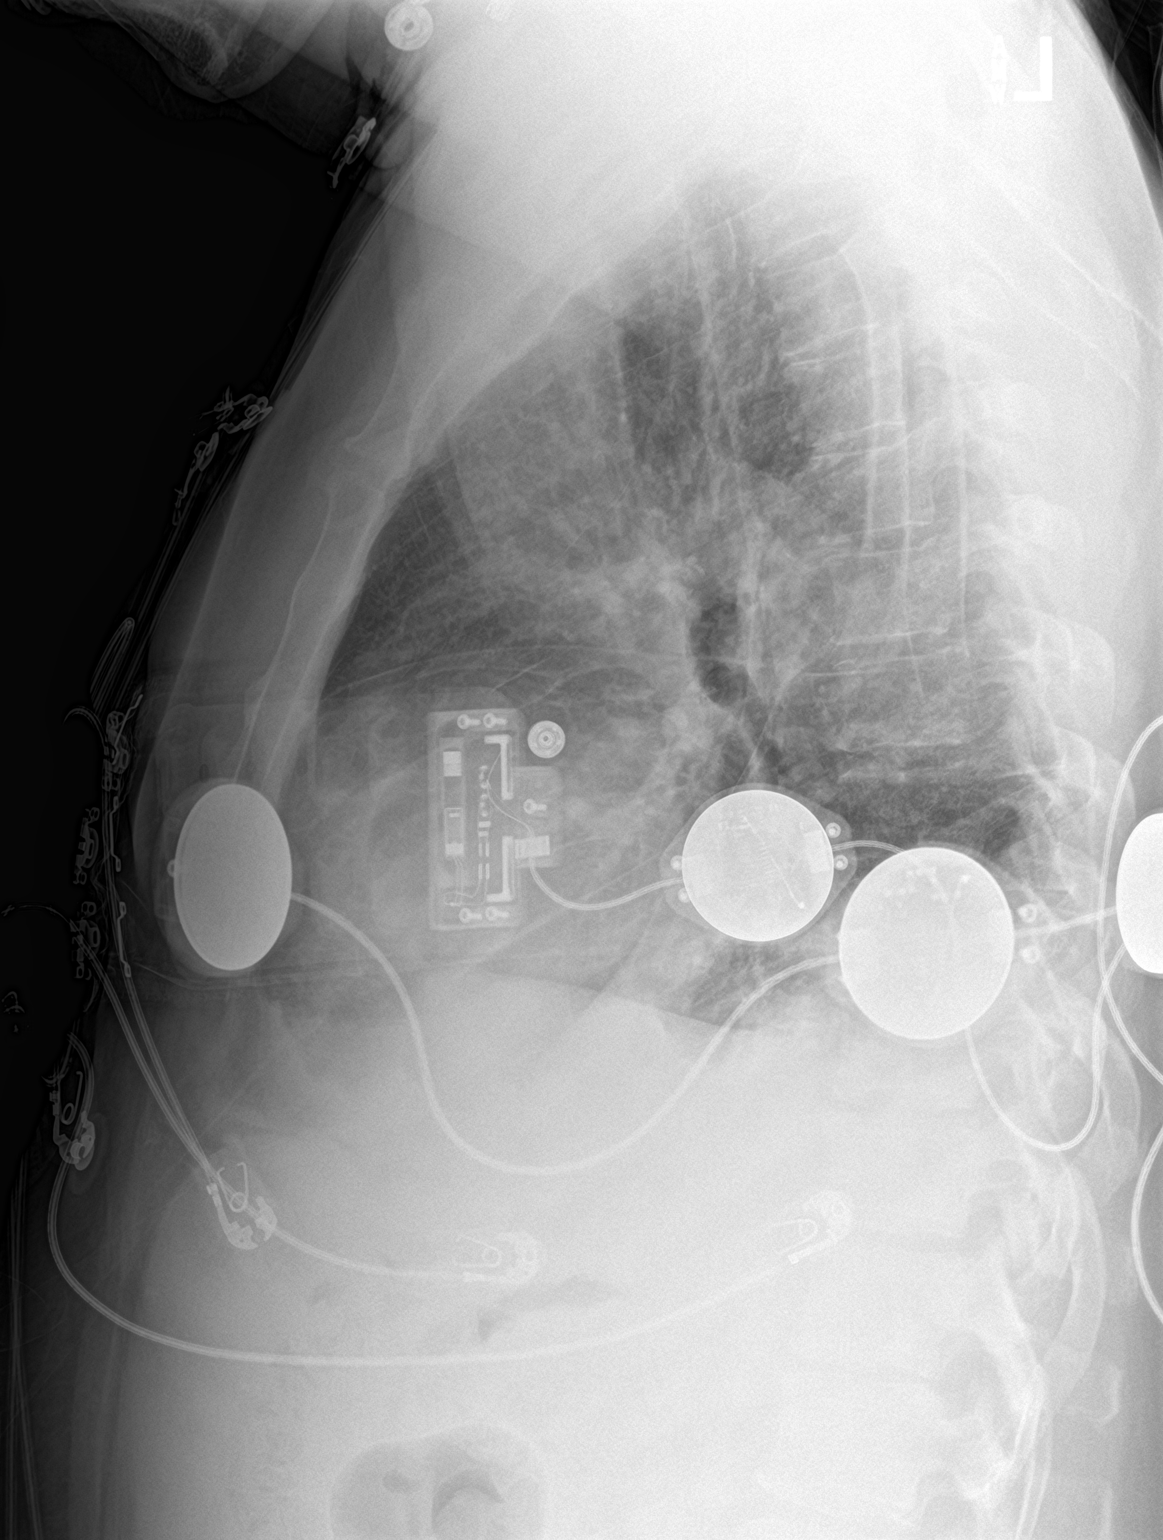

[2 of 2 positions shown; findings below may reference images not displayed]

FINDINGS: Mild cardiomegaly. Mild edema, improved since September 17, 2017 but
persistent. Small effusions with underlying atelectasis. No other
acute abnormalities identified.
IMPRESSION: 1. Mild cardiomegaly and mild edema.
2. Small bilateral effusions with underlying atelectasis.
